# Patient Record
Sex: Female | Born: 1970 | Race: White | Hispanic: No | Marital: Married | Smoking: Never smoker
Health system: Southern US, Community
[De-identification: ages and names within clinical notes are randomized; demographics above are authoritative.]

## PROBLEM LIST (undated history)

## (undated) DIAGNOSIS — R630 Anorexia: Secondary | ICD-10-CM

## (undated) DIAGNOSIS — N200 Calculus of kidney: Secondary | ICD-10-CM

## (undated) DIAGNOSIS — K760 Fatty (change of) liver, not elsewhere classified: Secondary | ICD-10-CM

## (undated) DIAGNOSIS — K589 Irritable bowel syndrome without diarrhea: Secondary | ICD-10-CM

## (undated) DIAGNOSIS — E1169 Type 2 diabetes mellitus with other specified complication: Secondary | ICD-10-CM

## (undated) DIAGNOSIS — R634 Abnormal weight loss: Secondary | ICD-10-CM

## (undated) DIAGNOSIS — R609 Edema, unspecified: Secondary | ICD-10-CM

## (undated) DIAGNOSIS — R42 Dizziness and giddiness: Secondary | ICD-10-CM

## (undated) DIAGNOSIS — N189 Chronic kidney disease, unspecified: Secondary | ICD-10-CM

## (undated) DIAGNOSIS — G4700 Insomnia, unspecified: Secondary | ICD-10-CM

## (undated) DIAGNOSIS — I809 Phlebitis and thrombophlebitis of unspecified site: Secondary | ICD-10-CM

## (undated) DIAGNOSIS — Z973 Presence of spectacles and contact lenses: Secondary | ICD-10-CM

## (undated) DIAGNOSIS — R319 Hematuria, unspecified: Secondary | ICD-10-CM

## (undated) DIAGNOSIS — E785 Hyperlipidemia, unspecified: Secondary | ICD-10-CM

## (undated) DIAGNOSIS — I872 Venous insufficiency (chronic) (peripheral): Secondary | ICD-10-CM

## (undated) DIAGNOSIS — E559 Vitamin D deficiency, unspecified: Secondary | ICD-10-CM

## (undated) DIAGNOSIS — R519 Headache, unspecified: Secondary | ICD-10-CM

## (undated) DIAGNOSIS — R51 Headache: Secondary | ICD-10-CM

## (undated) DIAGNOSIS — N83209 Unspecified ovarian cyst, unspecified side: Secondary | ICD-10-CM

## (undated) DIAGNOSIS — K769 Liver disease, unspecified: Secondary | ICD-10-CM

## (undated) DIAGNOSIS — R5383 Other fatigue: Secondary | ICD-10-CM

## (undated) DIAGNOSIS — F418 Other specified anxiety disorders: Secondary | ICD-10-CM

## (undated) DIAGNOSIS — R600 Localized edema: Secondary | ICD-10-CM

## (undated) DIAGNOSIS — N390 Urinary tract infection, site not specified: Secondary | ICD-10-CM

## (undated) DIAGNOSIS — R6883 Chills (without fever): Secondary | ICD-10-CM

## (undated) DIAGNOSIS — Z9884 Bariatric surgery status: Secondary | ICD-10-CM

## (undated) DIAGNOSIS — H538 Other visual disturbances: Secondary | ICD-10-CM

## (undated) HISTORY — DX: Presence of spectacles and contact lenses: Z97.3

## (undated) HISTORY — DX: Fatty (change of) liver, not elsewhere classified: K76.0

## (undated) HISTORY — PX: CYSTOSTOMY W/ BLADDER BIOPSY: SHX1431

## (undated) HISTORY — PX: APPENDECTOMY: SHX54

## (undated) HISTORY — PX: OTHER SURGICAL HISTORY: SHX169

## (undated) HISTORY — DX: Chronic kidney disease, unspecified: N18.9

## (undated) HISTORY — PX: UPPER GASTROINTESTINAL ENDOSCOPY: SHX188

## (undated) HISTORY — DX: Dizziness and giddiness: R42

## (undated) HISTORY — DX: Unspecified ovarian cyst, unspecified side: N83.209

## (undated) HISTORY — DX: Edema, unspecified: R60.9

## (undated) HISTORY — DX: Irritable bowel syndrome, unspecified: K58.9

## (undated) HISTORY — DX: Abnormal weight loss: R63.4

## (undated) HISTORY — DX: Hyperlipidemia, unspecified: E78.5

## (undated) HISTORY — DX: Liver disease, unspecified: K76.9

## (undated) HISTORY — DX: Headache, unspecified: R51.9

## (undated) HISTORY — DX: Insomnia, unspecified: G47.00

## (undated) HISTORY — DX: Other fatigue: R53.83

## (undated) HISTORY — DX: Urinary tract infection, site not specified: N39.0

## (undated) HISTORY — DX: Vitamin D deficiency, unspecified: E55.9

## (undated) HISTORY — PX: TUBAL LIGATION: SHX77

## (undated) HISTORY — DX: Bariatric surgery status: Z98.84

## (undated) HISTORY — DX: Headache: R51

## (undated) HISTORY — PX: COLONOSCOPY: SHX174

## (undated) HISTORY — DX: Other specified anxiety disorders: F41.8

## (undated) HISTORY — DX: Hematuria, unspecified: R31.9

## (undated) HISTORY — DX: Chills (without fever): R68.83

## (undated) HISTORY — DX: Venous insufficiency (chronic) (peripheral): I87.2

## (undated) HISTORY — DX: Localized edema: R60.0

## (undated) HISTORY — DX: Anorexia: R63.0

## (undated) HISTORY — DX: Type 2 diabetes mellitus with other specified complication: E11.69

## (undated) HISTORY — DX: Calculus of kidney: N20.0

## (undated) HISTORY — DX: Other visual disturbances: H53.8

---

## 2003-12-29 HISTORY — PX: CHOLECYSTECTOMY: SHX55

## 2006-12-28 HISTORY — PX: ABDOMINAL HYSTERECTOMY: SHX81

## 2009-07-23 ENCOUNTER — Emergency Department (HOSPITAL_BASED_OUTPATIENT_CLINIC_OR_DEPARTMENT_OTHER): Admission: EM | Admit: 2009-07-23 | Discharge: 2009-07-23 | Payer: Self-pay | Admitting: Emergency Medicine

## 2009-10-02 ENCOUNTER — Emergency Department (HOSPITAL_BASED_OUTPATIENT_CLINIC_OR_DEPARTMENT_OTHER): Admission: EM | Admit: 2009-10-02 | Discharge: 2009-10-02 | Payer: Self-pay | Admitting: Emergency Medicine

## 2009-10-02 ENCOUNTER — Emergency Department (HOSPITAL_BASED_OUTPATIENT_CLINIC_OR_DEPARTMENT_OTHER): Admission: EM | Admit: 2009-10-02 | Discharge: 2009-10-03 | Payer: Self-pay | Admitting: Emergency Medicine

## 2009-11-19 ENCOUNTER — Ambulatory Visit: Payer: Self-pay | Admitting: Diagnostic Radiology

## 2009-11-19 ENCOUNTER — Emergency Department (HOSPITAL_BASED_OUTPATIENT_CLINIC_OR_DEPARTMENT_OTHER): Admission: EM | Admit: 2009-11-19 | Discharge: 2009-11-19 | Payer: Self-pay | Admitting: Emergency Medicine

## 2009-12-18 ENCOUNTER — Emergency Department (HOSPITAL_BASED_OUTPATIENT_CLINIC_OR_DEPARTMENT_OTHER): Admission: EM | Admit: 2009-12-18 | Discharge: 2009-12-18 | Payer: Self-pay | Admitting: Emergency Medicine

## 2009-12-18 ENCOUNTER — Ambulatory Visit: Payer: Self-pay | Admitting: Diagnostic Radiology

## 2010-01-24 ENCOUNTER — Emergency Department (HOSPITAL_BASED_OUTPATIENT_CLINIC_OR_DEPARTMENT_OTHER): Admission: EM | Admit: 2010-01-24 | Discharge: 2010-01-24 | Payer: Self-pay | Admitting: Emergency Medicine

## 2010-01-24 ENCOUNTER — Ambulatory Visit: Payer: Self-pay | Admitting: Radiology

## 2010-02-02 ENCOUNTER — Emergency Department (HOSPITAL_BASED_OUTPATIENT_CLINIC_OR_DEPARTMENT_OTHER): Admission: EM | Admit: 2010-02-02 | Discharge: 2010-02-02 | Payer: Self-pay | Admitting: Emergency Medicine

## 2010-03-03 ENCOUNTER — Ambulatory Visit: Payer: Self-pay | Admitting: Diagnostic Radiology

## 2010-03-03 ENCOUNTER — Emergency Department (HOSPITAL_BASED_OUTPATIENT_CLINIC_OR_DEPARTMENT_OTHER): Admission: EM | Admit: 2010-03-03 | Discharge: 2010-03-03 | Payer: Self-pay | Admitting: Emergency Medicine

## 2010-03-31 ENCOUNTER — Ambulatory Visit: Payer: Self-pay | Admitting: Diagnostic Radiology

## 2010-03-31 ENCOUNTER — Emergency Department (HOSPITAL_BASED_OUTPATIENT_CLINIC_OR_DEPARTMENT_OTHER): Admission: EM | Admit: 2010-03-31 | Discharge: 2010-03-31 | Payer: Self-pay | Admitting: Emergency Medicine

## 2010-04-21 ENCOUNTER — Ambulatory Visit: Payer: Self-pay | Admitting: Radiology

## 2010-04-21 ENCOUNTER — Emergency Department (HOSPITAL_BASED_OUTPATIENT_CLINIC_OR_DEPARTMENT_OTHER): Admission: EM | Admit: 2010-04-21 | Discharge: 2010-04-21 | Payer: Self-pay | Admitting: Emergency Medicine

## 2010-05-25 ENCOUNTER — Emergency Department (HOSPITAL_BASED_OUTPATIENT_CLINIC_OR_DEPARTMENT_OTHER): Admission: EM | Admit: 2010-05-25 | Discharge: 2010-05-25 | Payer: Self-pay | Admitting: Emergency Medicine

## 2010-06-03 ENCOUNTER — Ambulatory Visit: Payer: Self-pay | Admitting: Diagnostic Radiology

## 2010-06-03 ENCOUNTER — Emergency Department (HOSPITAL_BASED_OUTPATIENT_CLINIC_OR_DEPARTMENT_OTHER): Admission: EM | Admit: 2010-06-03 | Discharge: 2010-06-03 | Payer: Self-pay | Admitting: Emergency Medicine

## 2010-06-20 ENCOUNTER — Ambulatory Visit: Payer: Self-pay | Admitting: Gastroenterology

## 2010-07-22 ENCOUNTER — Ambulatory Visit (HOSPITAL_COMMUNITY): Admission: RE | Admit: 2010-07-22 | Discharge: 2010-07-22 | Payer: Self-pay | Admitting: Surgery

## 2010-07-23 ENCOUNTER — Ambulatory Visit (HOSPITAL_COMMUNITY): Admission: RE | Admit: 2010-07-23 | Discharge: 2010-07-23 | Payer: Self-pay | Admitting: Surgery

## 2010-08-04 ENCOUNTER — Emergency Department (HOSPITAL_BASED_OUTPATIENT_CLINIC_OR_DEPARTMENT_OTHER): Admission: EM | Admit: 2010-08-04 | Discharge: 2010-08-04 | Payer: Self-pay | Admitting: Emergency Medicine

## 2010-08-04 ENCOUNTER — Ambulatory Visit: Payer: Self-pay | Admitting: Diagnostic Radiology

## 2010-08-13 ENCOUNTER — Encounter: Admission: RE | Admit: 2010-08-13 | Discharge: 2010-11-11 | Payer: Self-pay | Admitting: Surgery

## 2010-09-18 ENCOUNTER — Ambulatory Visit: Payer: Self-pay | Admitting: Diagnostic Radiology

## 2010-09-18 ENCOUNTER — Emergency Department (HOSPITAL_BASED_OUTPATIENT_CLINIC_OR_DEPARTMENT_OTHER): Admission: EM | Admit: 2010-09-18 | Discharge: 2010-09-18 | Payer: Self-pay | Admitting: Emergency Medicine

## 2010-10-28 HISTORY — PX: GASTRIC BYPASS: SHX52

## 2010-11-04 ENCOUNTER — Emergency Department (HOSPITAL_COMMUNITY)
Admission: EM | Admit: 2010-11-04 | Discharge: 2010-11-04 | Payer: Self-pay | Source: Home / Self Care | Admitting: Emergency Medicine

## 2010-11-04 ENCOUNTER — Ambulatory Visit: Payer: Self-pay | Admitting: Diagnostic Radiology

## 2010-11-04 ENCOUNTER — Encounter: Payer: Self-pay | Admitting: Emergency Medicine

## 2010-11-09 ENCOUNTER — Emergency Department (HOSPITAL_BASED_OUTPATIENT_CLINIC_OR_DEPARTMENT_OTHER): Admission: EM | Admit: 2010-11-09 | Discharge: 2010-11-09 | Payer: Self-pay | Admitting: Emergency Medicine

## 2010-11-24 ENCOUNTER — Inpatient Hospital Stay (HOSPITAL_COMMUNITY)
Admission: RE | Admit: 2010-11-24 | Discharge: 2010-11-26 | Payer: Self-pay | Source: Home / Self Care | Admitting: Surgery

## 2010-11-25 ENCOUNTER — Encounter (INDEPENDENT_AMBULATORY_CARE_PROVIDER_SITE_OTHER): Payer: Self-pay | Admitting: Surgery

## 2010-12-09 ENCOUNTER — Encounter
Admission: RE | Admit: 2010-12-09 | Discharge: 2011-01-27 | Payer: Self-pay | Source: Home / Self Care | Attending: Surgery | Admitting: Surgery

## 2010-12-28 HISTORY — PX: BILATERAL SALPINGOOPHORECTOMY: SHX1223

## 2011-01-01 ENCOUNTER — Ambulatory Visit: Admit: 2011-01-01 | Payer: Self-pay | Admitting: Gastroenterology

## 2011-01-18 ENCOUNTER — Encounter: Payer: Self-pay | Admitting: Gastroenterology

## 2011-01-20 ENCOUNTER — Encounter: Admit: 2011-01-20 | Payer: Self-pay | Admitting: Surgery

## 2011-02-02 IMAGING — CT CT ABD-PELV W/ CM
2 of 4 series · 17 of 46 positions shown, 19 images · IV contrast (APPLIED)
Comparison: Pelvic ultrasound, same day.  CT the abdomen and pelvis
dated 04/21/2010

CLINICAL DATA: Right lower quadrant pain and nausea

CT ABDOMEN AND PELVIS WITH CONTRAST
TECHNIQUE: Multidetector CT imaging of the abdomen and pelvis was
performed following the standard protocol during bolus
administration of intravenous contrast.
Contrast: 100 ml of 3mnipaque-7NN

[Series 2: abd/pelvis 5.0 b31f · axial · 0.98mm/px · z∈[-541,-96]mm · 14 of 97 slices shown, 16 images]
[im 4/97  soft-tissue]
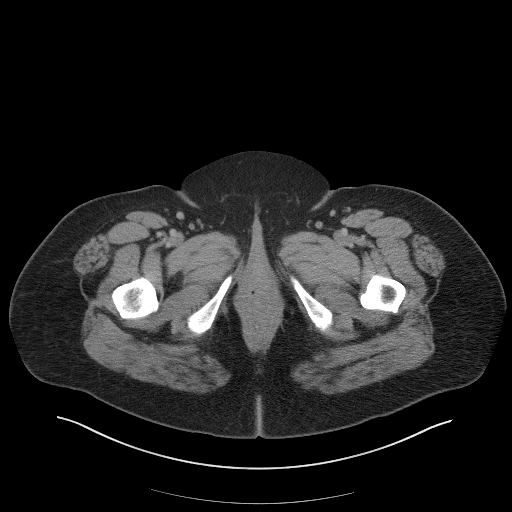
[im 4/97  bone]
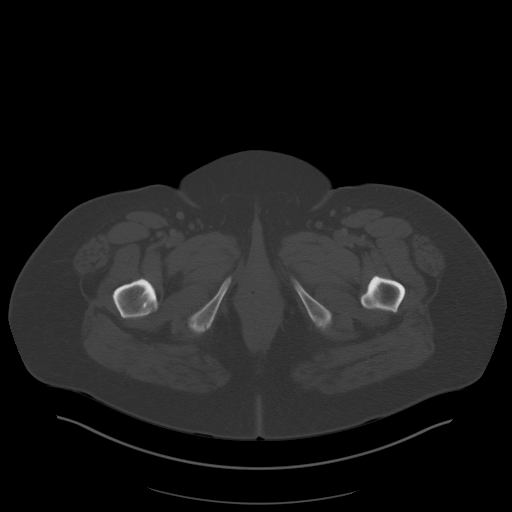
[im 12/97  soft-tissue]
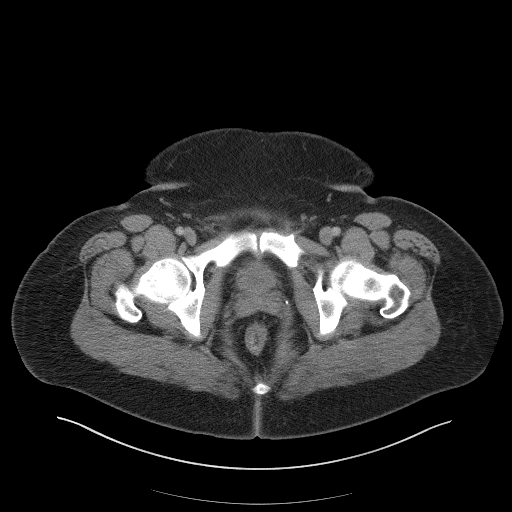
[im 20/97  soft-tissue]
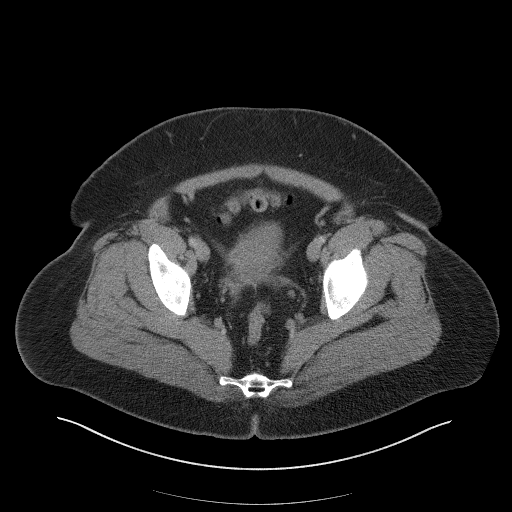
[im 27/97  soft-tissue]
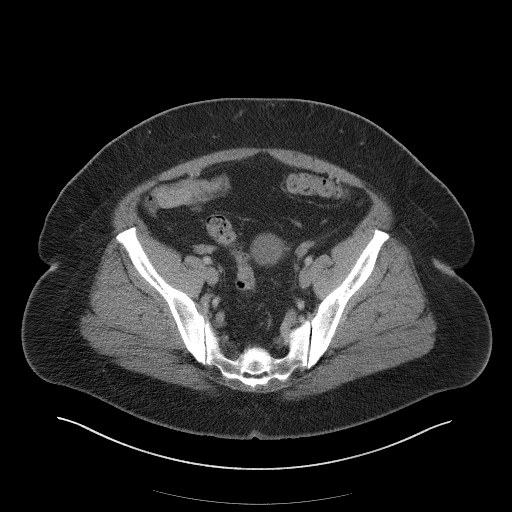
[im 31/97  soft-tissue]
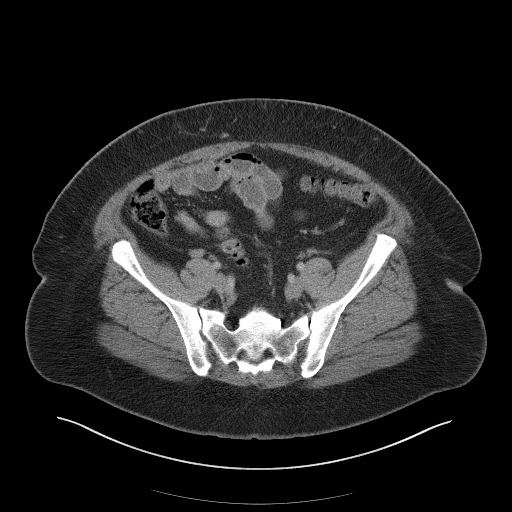
[im 39/97  soft-tissue]
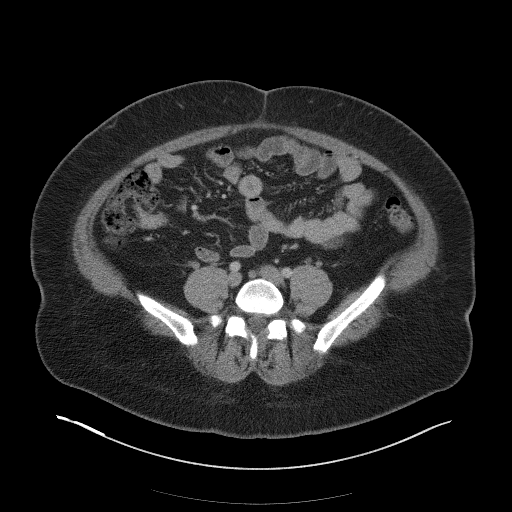
[im 47/97  soft-tissue]
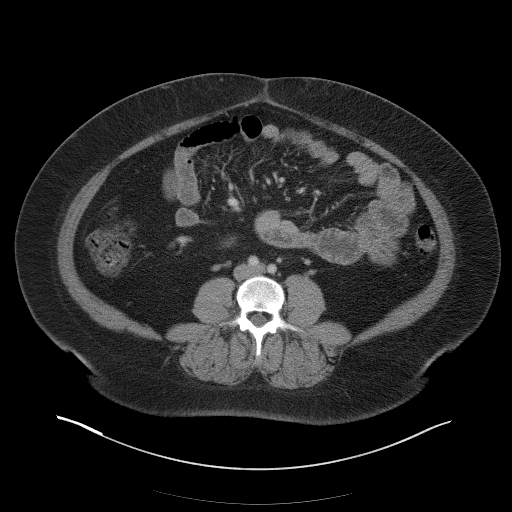
[im 50/97  soft-tissue]
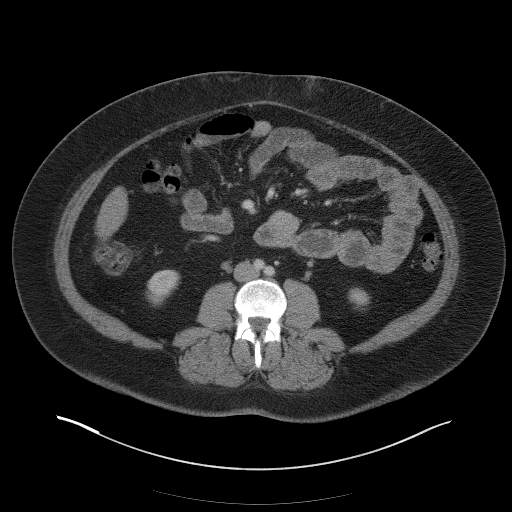
[im 58/97  soft-tissue]
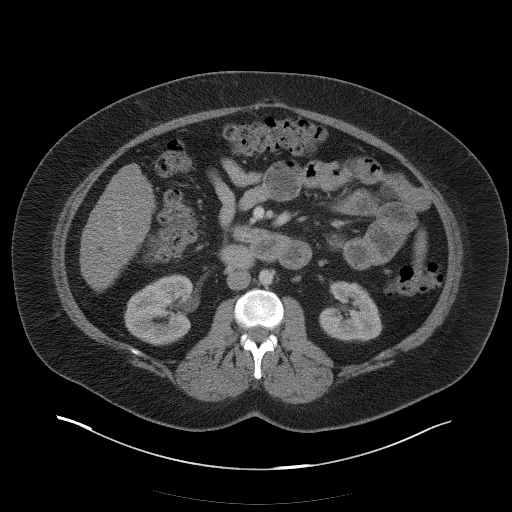
[im 58/97  bone]
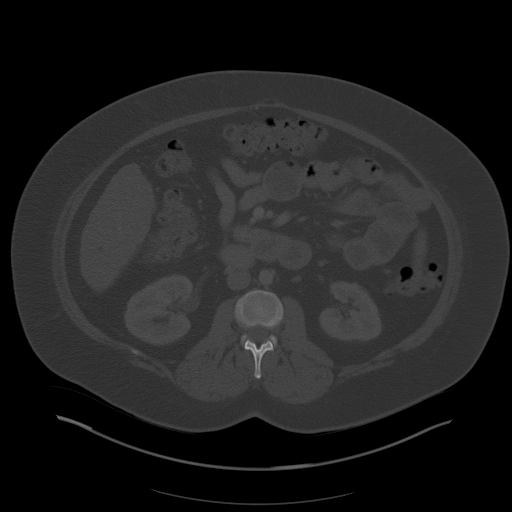
[im 66/97  soft-tissue]
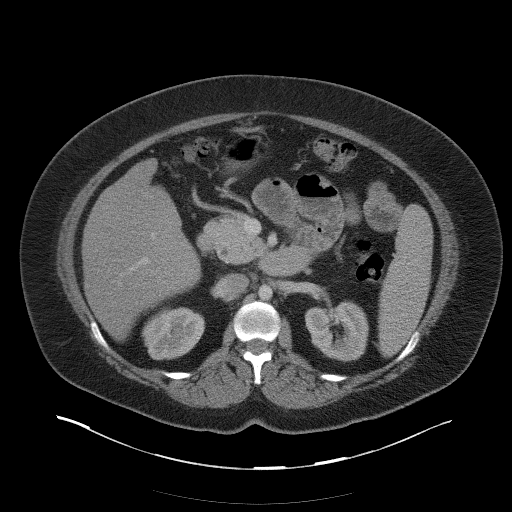
[im 73/97  soft-tissue]
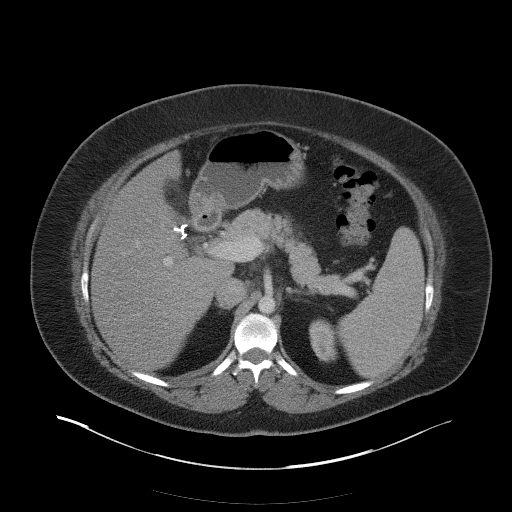
[im 77/97  soft-tissue]
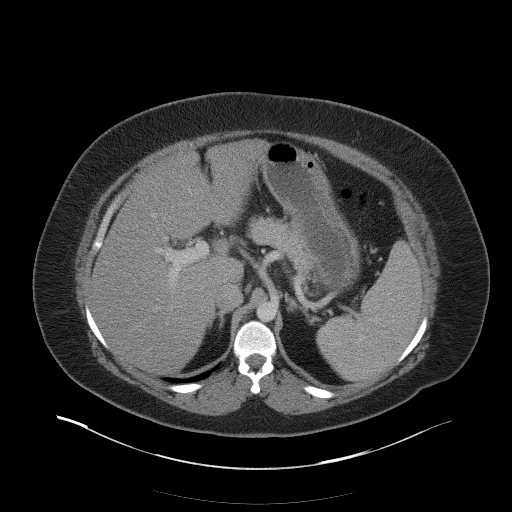
[im 85/97  soft-tissue]
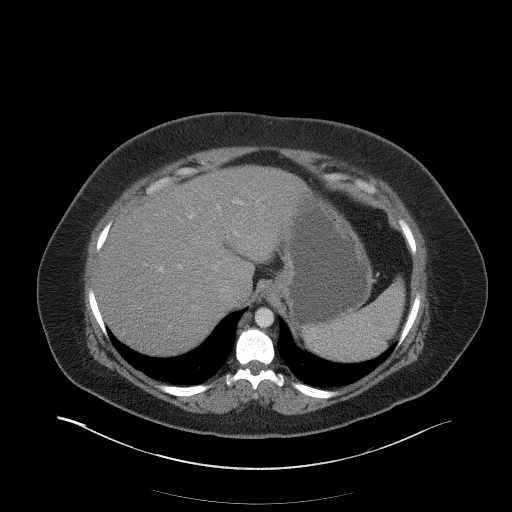
[im 93/97  soft-tissue]
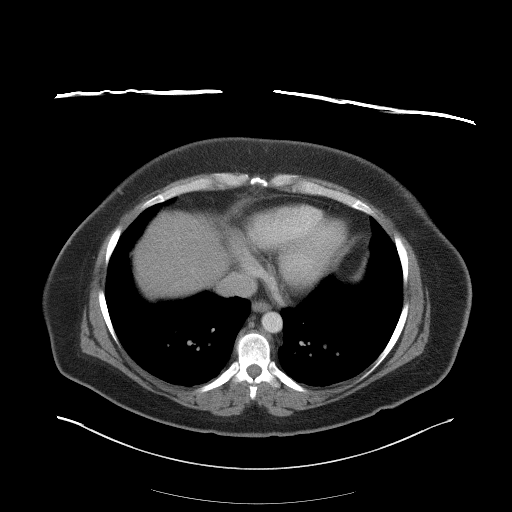

[Series 5: abd/pelvis 3.0 coronal · coronal · 1.05mm/px · 3 of 115 slices shown]
[im 39/115  soft-tissue]
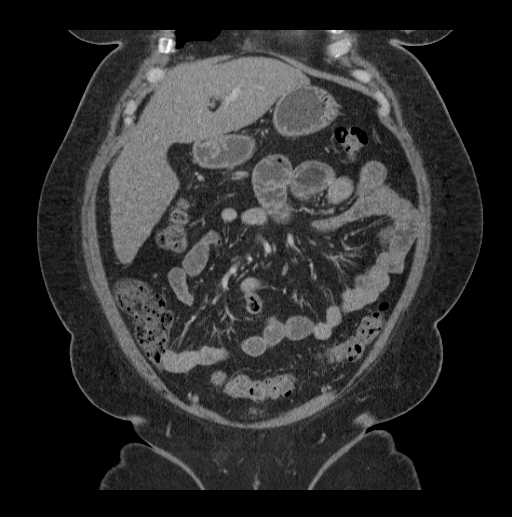
[im 51/115  soft-tissue]
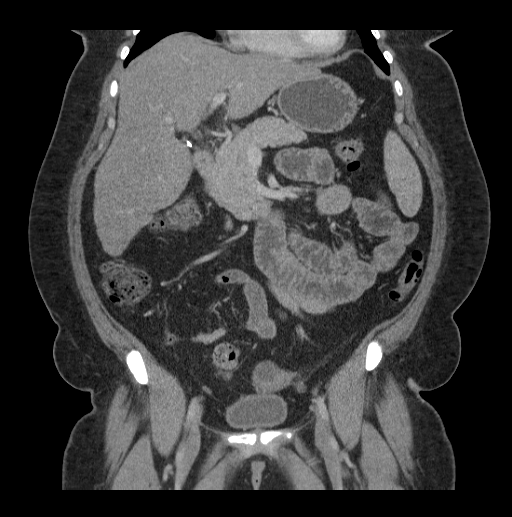
[im 64/115  soft-tissue]
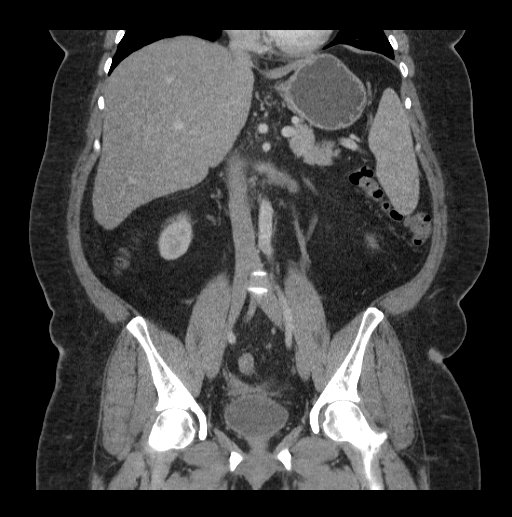

[17 of 46 positions shown; findings below may reference images not displayed]

FINDINGS: The lung bases are clear.  The heart is normal in size.

Two sub centimeter hypoattenuating lesions are seen in the
posterior right hepatic lobe, both of which are too small to
characterize.  The liver is otherwise normal. The patient status
post cholecystectomy.  The spleen, adrenal glands pancreas and
kidneys are within normal limits.  There is no hydronephrosis seen.

There are several loops of minimally dilated small bowel in the
left upper quadrant measuring up to 3.5 cm.  There is gas and fluid
in the more distal loops of small bowel.  A normal appendix is seen
in the right lower quadrant.  Gas and stool seen throughout the
colon.

The patient status post hysterectomy.  A low attenuation in lesion
is seen in the left ovary measuring a maximum 3.5 cm.  The right
ovary is normal.  No free fluid is seen in the abdomen or pelvis.
The aorta and major branch vessels are patent.

The osseous structures are within normal limits.
IMPRESSION: 1.  Loops of mildly dilated small bowel in the left upper quadrant
with frayed gas is seen in more distal bowel.  There is stool
throughout the colon.  This may relate to partial, low grade small
bowel obstruction or early small bowel obstruction.
2.  Left adnexal mass which could possibly be related to
hemorrhagic cyst versus endometrioma.  Again, please see dedicated
pelvic ultrasound for further evaluation and recommendations.

## 2011-02-02 IMAGING — US US PELVIS COMPLETE MODIFY
1 series · 13 of 25 positions shown · non-contrast
Comparison: CT 04/21/2010.  CT 03/31/2010.

CLINICAL DATA: History of right-sided pelvic pain.  History of
hysterectomy.

TRANSABDOMINAL AND TRANSVAGINAL ULTRASOUND OF PELVIS
DOPPLER ULTRASOUND OF OVARIES
TECHNIQUE: Both transabdominal and transvaginal ultrasound
examinations of the pelvis were performed including evaluation of
the uterus, ovaries, adnexal regions, and pelvic cul-de-sac.
Transabdominal technique was performed for global imaging of the
pelvis.  Transvaginal technique was performed for detailed
evaluation of the endometrium and/or ovaries.  Color and duplex
Doppler ultrasound was utilized to evaluate blood flow to the
ovaries.

[Series 1: us pelvis complete modify · 0.30mm/px · 13 of 68 slices shown]
[im 1/68]
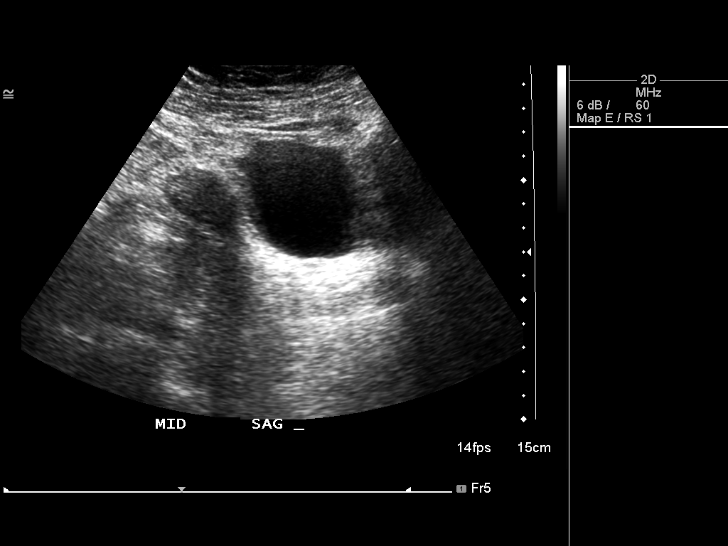
[im 6/68]
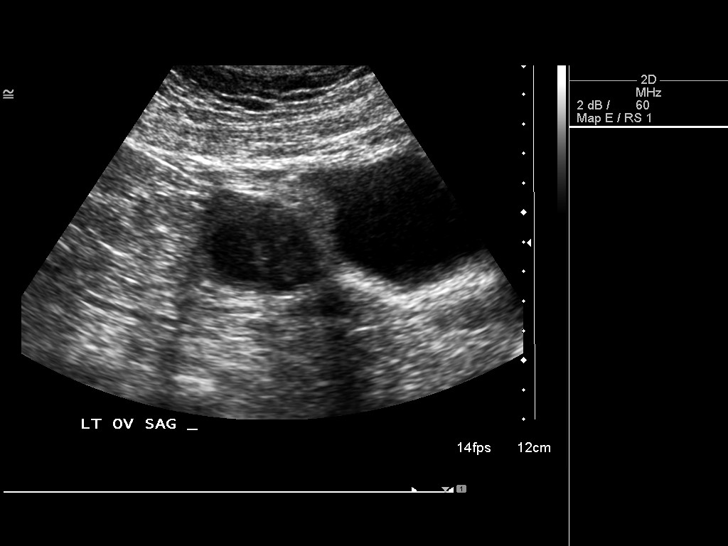
[im 12/68]
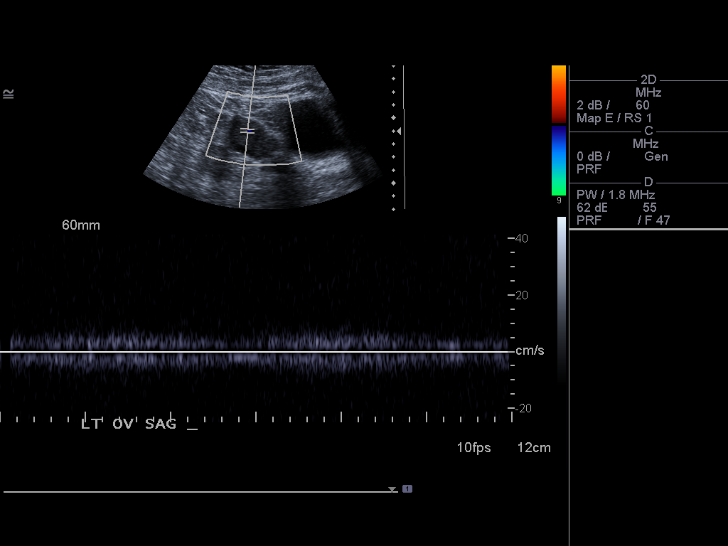
[im 17/68]
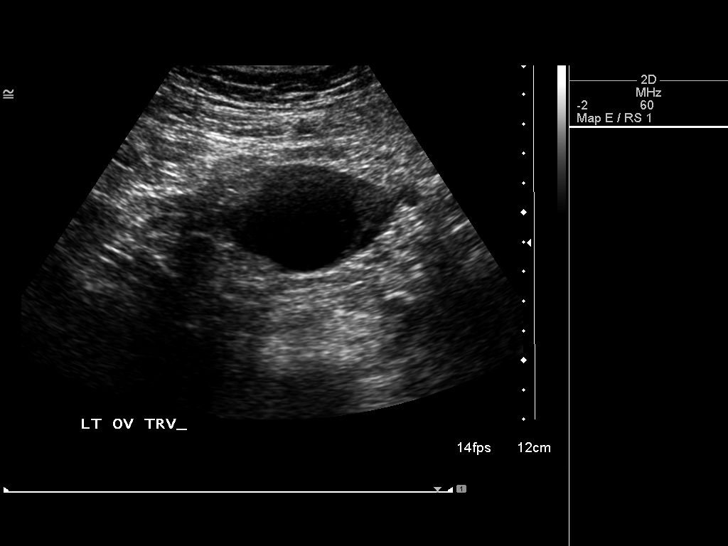
[im 23/68]
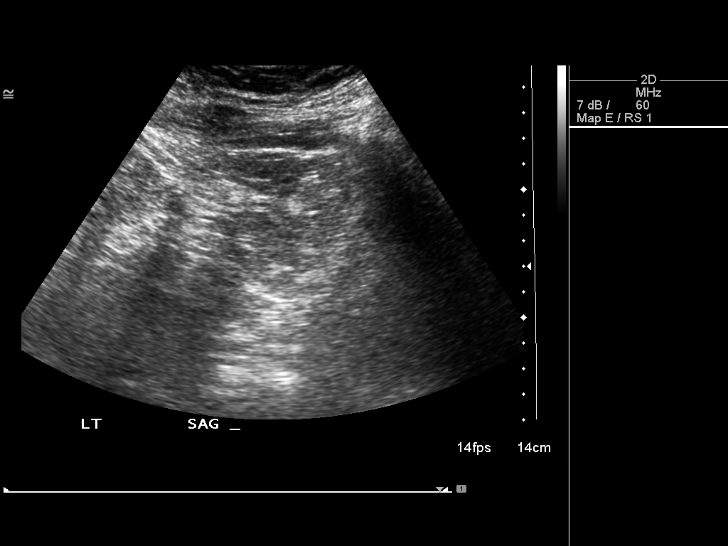
[im 28/68]
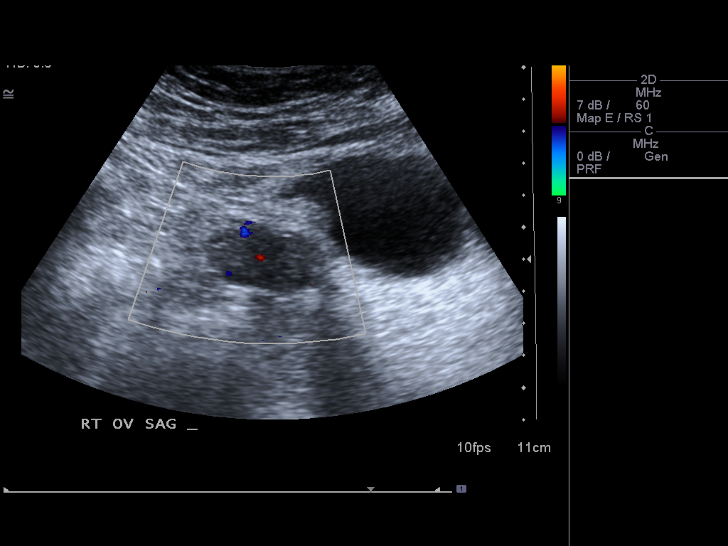
[im 34/68]
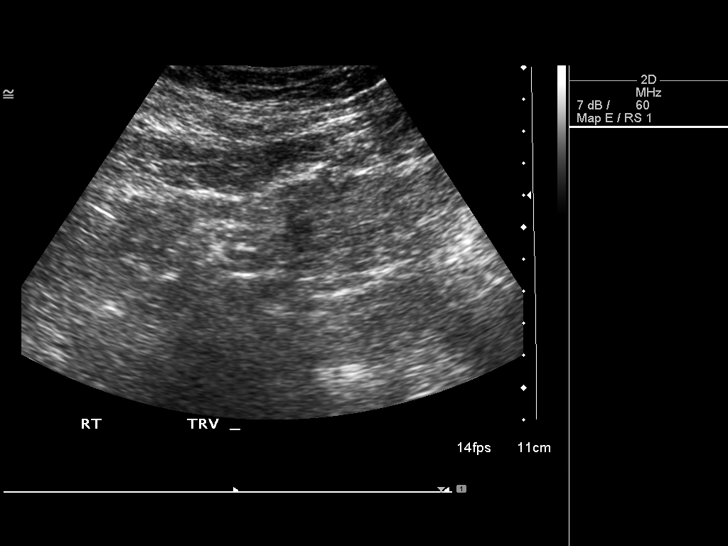
[im 40/68]
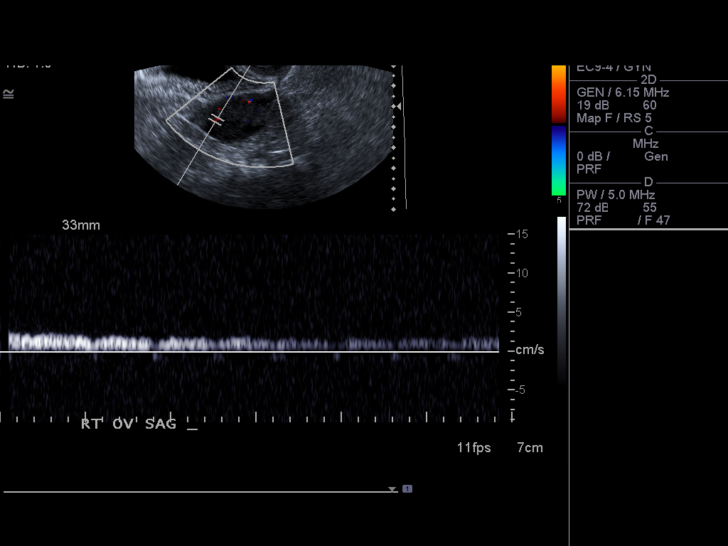
[im 45/68]
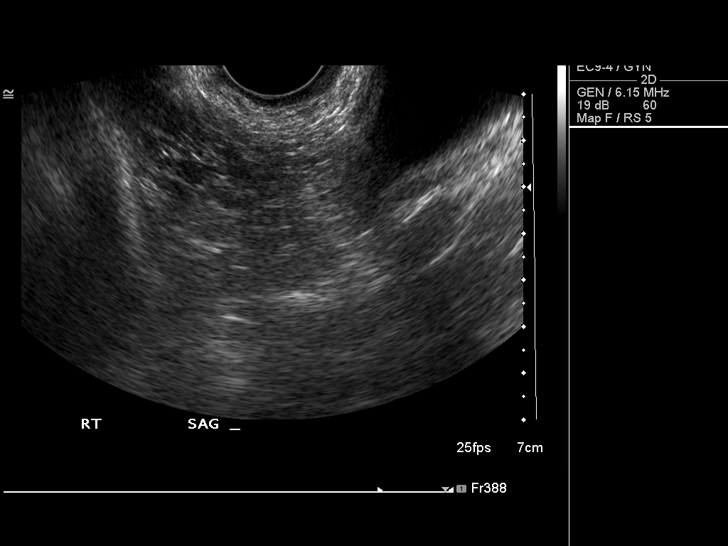
[im 51/68]
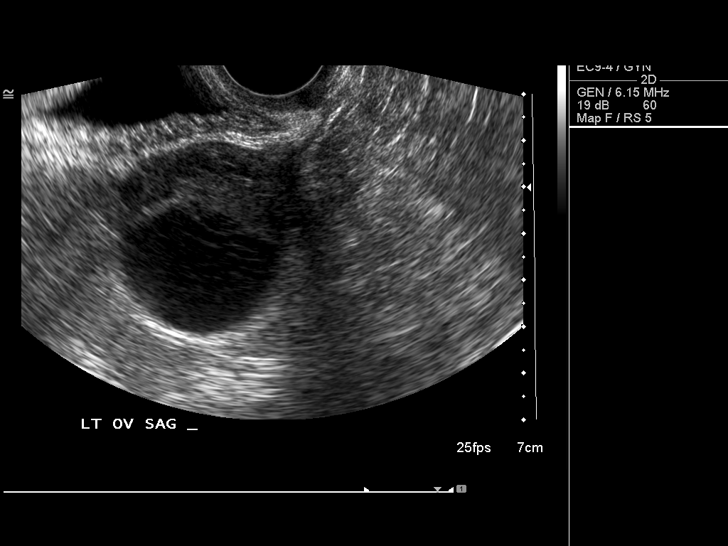
[im 56/68]
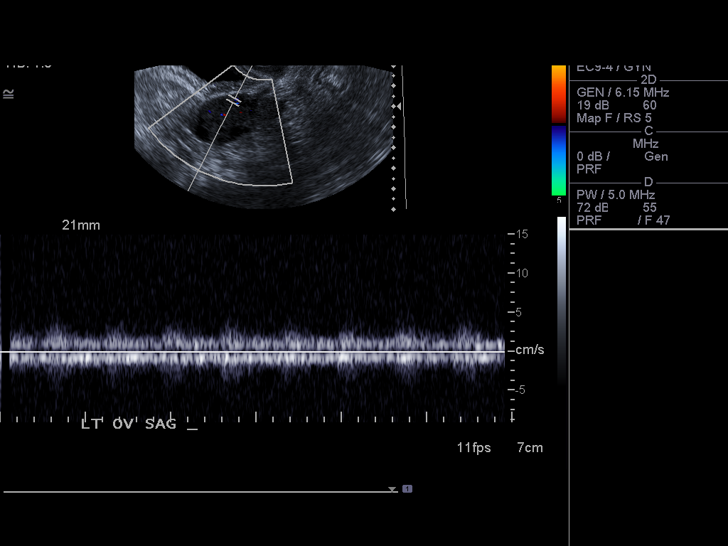
[im 62/68]
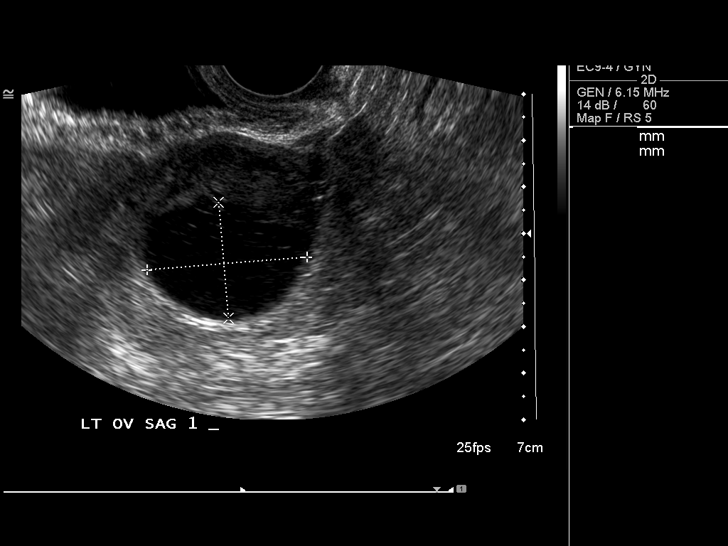
[im 68/68]
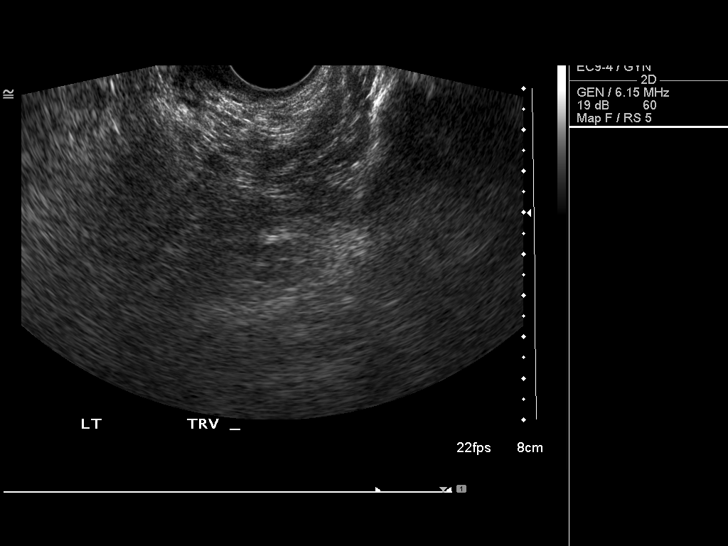

[13 of 25 positions shown; findings below may reference images not displayed]

FINDINGS: Uterus :  There is history of previous hysterectomy.  No uterus was
evident.

Endometrium :  Previous hysterectomy has been performed.

Right Ovary measures 3.8 x 2.0 x 2.5 cm.  Neither right ovarian
lesion is evident.  Color Doppler flow appeared normal.  Arterial
venous waveforms appeared normal.

Left Ovary measures 6.9 x 4.8 x 3.3 cm.  This is enlarged.

Hypoechoic lesion is evident within the left ovary.  The lesion
measures 5.2 x 4.2 x 3.9 cm. It has some low intensity internal
echoes within it.  No internal color Doppler flow was seen.  No
solid or papillary area was seen associated with it.  It has a thin
wall.  No calcifications were evident within it.

 Left ovarian tissue color Doppler flow appeared normal.  Left
ovarian tissue venous and arterial waveforms appeared normal.

Other Findings:  No free fluid is seen within the pelvis.  No tubal
abnormality is evident.
IMPRESSION: Previous hysterectomy has been performed.  No free fluid is seen in
the pelvis.

History given of right-sided pain.  No abnormality was seen on the
right side.

Both ovaries showed normal color Doppler flow with normal venous
and arterial wave forms.  There is no evidence of torsion or
ischemia.

There is enlargement of the left ovary.

Hypoechoic lesion is evident within the left ovary.  The lesion
measures 5.2 x 4.2 x 3.9 cm. It appears cystic. It has some low
intensity internal echoes within it.  No internal color Doppler
flow was seen.  No solid or papillary area was seen associated with
it.  It has a thin wall.  No calcifications were evident within it.

The left ovarian lesion may reflect a complicated cyst.  However,
recommend follow-up in 6-12 weeks for additional evaluation and
characterization.

This recommendation follows the consensus statement:  Management of
Asymptomatic Ovarian and Other Adnexal Cysts Imaged at US:  Society
of Radiologists in Ultrasound Consensus Conference Statement.
[URL]

## 2011-03-10 LAB — CBC
HCT: 35.6 % — ABNORMAL LOW (ref 36.0–46.0)
Hemoglobin: 13.9 g/dL (ref 12.0–15.0)
MCH: 29.9 pg (ref 26.0–34.0)
MCH: 30.8 pg (ref 26.0–34.0)
MCHC: 34.5 g/dL (ref 30.0–36.0)
MCHC: 34.8 g/dL (ref 30.0–36.0)
MCV: 88.5 fL (ref 78.0–100.0)
MCV: 87.9 fL (ref 78.0–100.0)
MCV: 88.5 fL (ref 78.0–100.0)
Platelets: 118 10*3/uL — ABNORMAL LOW (ref 150–400)
Platelets: 98 10*3/uL — ABNORMAL LOW (ref 150–400)
RBC: 4.63 MIL/uL (ref 3.87–5.11)
RBC: 4.05 MIL/uL (ref 3.87–5.11)
RDW: 13.7 % (ref 11.5–15.5)
RDW: 14.2 % (ref 11.5–15.5)
WBC: 6.9 10*3/uL (ref 4.0–10.5)
WBC: 7 10*3/uL (ref 4.0–10.5)

## 2011-03-10 LAB — DIFFERENTIAL
Basophils Absolute: 0 10*3/uL (ref 0.0–0.1)
Basophils Relative: 1 % (ref 0–1)
Basophils Relative: 1 % (ref 0–1)
Eosinophils Absolute: 0 10*3/uL (ref 0.0–0.7)
Eosinophils Absolute: 0.1 10*3/uL (ref 0.0–0.7)
Eosinophils Relative: 3 % (ref 0–5)
Eosinophils Relative: 0 % (ref 0–5)
Eosinophils Relative: 1 % (ref 0–5)
Lymphocytes Relative: 34 % (ref 12–46)
Lymphocytes Relative: 30 % (ref 12–46)
Lymphs Abs: 1.5 10*3/uL (ref 0.7–4.0)
Lymphs Abs: 2.1 10*3/uL (ref 0.7–4.0)
Lymphs Abs: 2.7 10*3/uL (ref 0.7–4.0)
Monocytes Absolute: 0.4 10*3/uL (ref 0.1–1.0)
Monocytes Absolute: 0.5 10*3/uL (ref 0.1–1.0)
Monocytes Absolute: 0.5 10*3/uL (ref 0.1–1.0)
Monocytes Relative: 5 % (ref 3–12)
Monocytes Relative: 6 % (ref 3–12)
Neutro Abs: 2.8 10*3/uL (ref 1.7–7.7)
Neutro Abs: 3.7 10*3/uL (ref 1.7–7.7)
Neutrophils Relative %: 53 % (ref 43–77)
Neutrophils Relative %: 79 % — ABNORMAL HIGH (ref 43–77)

## 2011-03-10 LAB — GLUCOSE, CAPILLARY
Glucose-Capillary: 145 mg/dL — ABNORMAL HIGH (ref 70–99)
Glucose-Capillary: 146 mg/dL — ABNORMAL HIGH (ref 70–99)
Glucose-Capillary: 170 mg/dL — ABNORMAL HIGH (ref 70–99)
Glucose-Capillary: 189 mg/dL — ABNORMAL HIGH (ref 70–99)
Glucose-Capillary: 196 mg/dL — ABNORMAL HIGH (ref 70–99)
Glucose-Capillary: 198 mg/dL — ABNORMAL HIGH (ref 70–99)
Glucose-Capillary: 209 mg/dL — ABNORMAL HIGH (ref 70–99)
Glucose-Capillary: 235 mg/dL — ABNORMAL HIGH (ref 70–99)
Glucose-Capillary: 278 mg/dL — ABNORMAL HIGH (ref 70–99)
Glucose-Capillary: 278 mg/dL — ABNORMAL HIGH (ref 70–99)
Glucose-Capillary: 325 mg/dL — ABNORMAL HIGH (ref 70–99)

## 2011-03-10 LAB — URINALYSIS, ROUTINE W REFLEX MICROSCOPIC
Glucose, UA: >1000 mg/dL — AB
Glucose, UA: NEGATIVE mg/dL
Leukocytes, UA: NEGATIVE
Nitrite: NEGATIVE
Protein, ur: NEGATIVE mg/dL
Specific Gravity, Urine: 1.03 (ref 1.005–1.030)
Specific Gravity, Urine: 1.018 (ref 1.005–1.030)
pH: 6 (ref 5.0–8.0)
pH: 8 (ref 5.0–8.0)

## 2011-03-10 LAB — HEMOGLOBIN AND HEMATOCRIT, BLOOD
HCT: 37.1 % (ref 36.0–46.0)
HCT: 37.1 % (ref 36.0–46.0)
Hemoglobin: 12.9 g/dL (ref 12.0–15.0)

## 2011-03-10 LAB — URINE MICROSCOPIC-ADD ON

## 2011-03-10 LAB — BASIC METABOLIC PANEL
CO2: 25 mEq/L (ref 19–32)
Chloride: 100 mEq/L (ref 96–112)
GFR calc Af Amer: >60 mL/min (ref >60)
Sodium: 139 mEq/L (ref 135–145)

## 2011-03-10 LAB — COMPREHENSIVE METABOLIC PANEL
ALT: 29 U/L (ref 0–35)
Albumin: 4.1 g/dL (ref 3.5–5.2)
Alkaline Phosphatase: 52 U/L (ref 39–117)
Calcium: 9.7 mg/dL (ref 8.4–10.5)
Potassium: 3.5 mEq/L (ref 3.5–5.1)
Sodium: 141 mEq/L (ref 135–145)
Total Protein: 7.2 g/dL (ref 6.0–8.3)

## 2011-03-10 LAB — URINE CULTURE
Colony Count: NO GROWTH
Colony Count: NO GROWTH
Culture  Setup Time: 201111290131
Special Requests: NEGATIVE

## 2011-03-10 LAB — SURGICAL PCR SCREEN
MRSA, PCR: NEGATIVE
Staphylococcus aureus: NEGATIVE

## 2011-03-12 LAB — URINALYSIS, ROUTINE W REFLEX MICROSCOPIC
Glucose, UA: >1000 mg/dL — AB
Specific Gravity, Urine: 1.023 (ref 1.005–1.030)
Urobilinogen, UA: 1 mg/dL (ref 0.0–1.0)

## 2011-03-12 LAB — URINE CULTURE

## 2011-03-12 LAB — URINE MICROSCOPIC-ADD ON

## 2011-03-13 LAB — COMPREHENSIVE METABOLIC PANEL
ALT: 28 U/L (ref 0–35)
AST: 34 U/L (ref 0–37)
Alkaline Phosphatase: 57 U/L (ref 39–117)
CO2: 26 mEq/L (ref 19–32)
Chloride: 101 mEq/L (ref 96–112)
Creatinine, Ser: 0.9 mg/dL (ref 0.4–1.2)
GFR calc Af Amer: >60 mL/min (ref >60)
GFR calc non Af Amer: >60 mL/min (ref >60)
Potassium: 4.1 mEq/L (ref 3.5–5.1)
Sodium: 137 mEq/L (ref 135–145)
Total Bilirubin: 1.4 mg/dL — ABNORMAL HIGH (ref 0.3–1.2)

## 2011-03-13 LAB — URINALYSIS, ROUTINE W REFLEX MICROSCOPIC
Bilirubin Urine: NEGATIVE
Ketones, ur: NEGATIVE mg/dL
Leukocytes, UA: NEGATIVE
Nitrite: NEGATIVE
Protein, ur: 100 mg/dL — AB

## 2011-03-13 LAB — CBC
Hemoglobin: 13.8 g/dL (ref 12.0–15.0)
MCH: 30.5 pg (ref 26.0–34.0)
RBC: 4.52 MIL/uL (ref 3.87–5.11)
WBC: 6.9 10*3/uL (ref 4.0–10.5)

## 2011-03-13 LAB — DIFFERENTIAL
Basophils Absolute: 0.1 10*3/uL (ref 0.0–0.1)
Basophils Relative: 1 % (ref 0–1)
Eosinophils Absolute: 0.2 10*3/uL (ref 0.0–0.7)
Eosinophils Relative: 2 % (ref 0–5)
Monocytes Absolute: 0.3 10*3/uL (ref 0.1–1.0)

## 2011-03-13 LAB — WET PREP, GENITAL
Clue Cells Wet Prep HPF POC: NONE SEEN
Trich, Wet Prep: NONE SEEN

## 2011-03-15 LAB — BASIC METABOLIC PANEL
BUN: 13 mg/dL (ref 6–23)
Calcium: 9.2 mg/dL (ref 8.4–10.5)
GFR calc non Af Amer: >60 mL/min (ref >60)
Potassium: 3.7 mEq/L (ref 3.5–5.1)
Sodium: 139 mEq/L (ref 135–145)

## 2011-03-15 LAB — DIFFERENTIAL
Basophils Absolute: 0.1 10*3/uL (ref 0.0–0.1)
Eosinophils Relative: 2 % (ref 0–5)
Lymphocytes Relative: 39 % (ref 12–46)
Lymphs Abs: 2.8 10*3/uL (ref 0.7–4.0)
Neutro Abs: 3.7 10*3/uL (ref 1.7–7.7)

## 2011-03-15 LAB — URINALYSIS, ROUTINE W REFLEX MICROSCOPIC
Glucose, UA: NEGATIVE mg/dL
Ketones, ur: NEGATIVE mg/dL
Leukocytes, UA: NEGATIVE
Nitrite: NEGATIVE
Specific Gravity, Urine: 1.021 (ref 1.005–1.030)
pH: 7.5 (ref 5.0–8.0)

## 2011-03-15 LAB — CBC
HCT: 39.6 % (ref 36.0–46.0)
Hemoglobin: 13.7 g/dL (ref 12.0–15.0)
Platelets: 92 10*3/uL — ABNORMAL LOW (ref 150–400)
RDW: 13.2 % (ref 11.5–15.5)
WBC: 7.1 10*3/uL (ref 4.0–10.5)

## 2011-03-15 LAB — URINE MICROSCOPIC-ADD ON

## 2011-03-16 LAB — DIFFERENTIAL
Eosinophils Relative: 2 % (ref 0–5)
Lymphocytes Relative: 36 % (ref 12–46)
Lymphocytes Relative: 43 % (ref 12–46)
Lymphs Abs: 2.5 10*3/uL (ref 0.7–4.0)
Monocytes Absolute: 0.3 10*3/uL (ref 0.1–1.0)
Monocytes Relative: 5 % (ref 3–12)
Monocytes Relative: 6 % (ref 3–12)
Neutro Abs: 2.4 10*3/uL (ref 1.7–7.7)

## 2011-03-16 LAB — COMPREHENSIVE METABOLIC PANEL
ALT: 23 U/L (ref 0–35)
AST: 33 U/L (ref 0–37)
CO2: 26 mEq/L (ref 19–32)
Calcium: 9.2 mg/dL (ref 8.4–10.5)
Creatinine, Ser: 0.8 mg/dL (ref 0.4–1.2)
GFR calc Af Amer: >60 mL/min (ref >60)
GFR calc non Af Amer: >60 mL/min (ref >60)
Sodium: 142 mEq/L (ref 135–145)
Total Protein: 7.1 g/dL (ref 6.0–8.3)

## 2011-03-16 LAB — URINALYSIS, ROUTINE W REFLEX MICROSCOPIC
Glucose, UA: NEGATIVE mg/dL
Leukocytes, UA: NEGATIVE
Nitrite: NEGATIVE
Nitrite: NEGATIVE
Protein, ur: NEGATIVE mg/dL
Specific Gravity, Urine: 1.023 (ref 1.005–1.030)
Urobilinogen, UA: 1 mg/dL (ref 0.0–1.0)
pH: 5.5 (ref 5.0–8.0)
pH: 6 (ref 5.0–8.0)

## 2011-03-16 LAB — BASIC METABOLIC PANEL
CO2: 26 mEq/L (ref 19–32)
Calcium: 9.3 mg/dL (ref 8.4–10.5)
GFR calc Af Amer: >60 mL/min (ref >60)
GFR calc non Af Amer: >60 mL/min (ref >60)
Potassium: 3.8 mEq/L (ref 3.5–5.1)
Sodium: 139 mEq/L (ref 135–145)

## 2011-03-16 LAB — CBC
HCT: 38.6 % (ref 36.0–46.0)
Hemoglobin: 13.1 g/dL (ref 12.0–15.0)
MCHC: 34.4 g/dL (ref 30.0–36.0)
MCV: 88.6 fL (ref 78.0–100.0)
Platelets: 100 10*3/uL — ABNORMAL LOW (ref 150–400)
RBC: 4.26 MIL/uL (ref 3.87–5.11)
RBC: 4.32 MIL/uL (ref 3.87–5.11)
RDW: 12.8 % (ref 11.5–15.5)

## 2011-03-16 LAB — HEMOCCULT GUIAC POC 1CARD (OFFICE): Fecal Occult Bld: NEGATIVE

## 2011-03-16 LAB — URINE MICROSCOPIC-ADD ON

## 2011-03-16 LAB — LIPASE, BLOOD: Lipase: 234 U/L (ref 23–300)

## 2011-03-17 LAB — URINALYSIS, ROUTINE W REFLEX MICROSCOPIC
Glucose, UA: NEGATIVE mg/dL
Hgb urine dipstick: NEGATIVE
Specific Gravity, Urine: 1.026 (ref 1.005–1.030)
pH: 8.5 — ABNORMAL HIGH (ref 5.0–8.0)

## 2011-03-18 LAB — DIFFERENTIAL
Lymphocytes Relative: 39 % (ref 12–46)
Lymphs Abs: 2.9 10*3/uL (ref 0.7–4.0)
Monocytes Absolute: 0.4 10*3/uL (ref 0.1–1.0)
Monocytes Relative: 6 % (ref 3–12)
Neutro Abs: 4 10*3/uL (ref 1.7–7.7)

## 2011-03-18 LAB — BASIC METABOLIC PANEL
CO2: 29 mEq/L (ref 19–32)
Calcium: 9.2 mg/dL (ref 8.4–10.5)
GFR calc Af Amer: >60 mL/min (ref >60)
GFR calc non Af Amer: >60 mL/min (ref >60)
Sodium: 141 mEq/L (ref 135–145)

## 2011-03-18 LAB — URINALYSIS, ROUTINE W REFLEX MICROSCOPIC
Bilirubin Urine: NEGATIVE
Hgb urine dipstick: NEGATIVE
Ketones, ur: NEGATIVE mg/dL
Leukocytes, UA: NEGATIVE
Nitrite: NEGATIVE
Specific Gravity, Urine: 1.023 (ref 1.005–1.030)
Specific Gravity, Urine: 1.024 (ref 1.005–1.030)
Urobilinogen, UA: 1 mg/dL (ref 0.0–1.0)
pH: 7.5 (ref 5.0–8.0)

## 2011-03-18 LAB — URINE MICROSCOPIC-ADD ON

## 2011-03-18 LAB — CBC
Hemoglobin: 13.2 g/dL (ref 12.0–15.0)
MCHC: 34.7 g/dL (ref 30.0–36.0)
RBC: 4.24 MIL/uL (ref 3.87–5.11)
WBC: 7.5 10*3/uL (ref 4.0–10.5)

## 2011-03-18 LAB — PREGNANCY, URINE: Preg Test, Ur: NEGATIVE

## 2011-03-23 ENCOUNTER — Emergency Department (HOSPITAL_BASED_OUTPATIENT_CLINIC_OR_DEPARTMENT_OTHER)
Admission: EM | Admit: 2011-03-23 | Discharge: 2011-03-23 | Disposition: A | Discharge status: Home / Self Care | Payer: Managed Care, Other (non HMO) | Attending: Emergency Medicine | Admitting: Emergency Medicine

## 2011-03-23 DIAGNOSIS — N200 Calculus of kidney: Secondary | ICD-10-CM | POA: Insufficient documentation

## 2011-03-23 DIAGNOSIS — E119 Type 2 diabetes mellitus without complications: Secondary | ICD-10-CM | POA: Insufficient documentation

## 2011-03-23 DIAGNOSIS — R109 Unspecified abdominal pain: Secondary | ICD-10-CM | POA: Insufficient documentation

## 2011-03-23 LAB — DIFFERENTIAL
Basophils Absolute: 0.1 10*3/uL (ref 0.0–0.1)
Eosinophils Relative: 2 % (ref 0–5)
Lymphocytes Relative: 38 % (ref 12–46)
Monocytes Absolute: 0.4 10*3/uL (ref 0.1–1.0)
Monocytes Relative: 5 % (ref 3–12)

## 2011-03-23 LAB — BASIC METABOLIC PANEL
CO2: 34 mEq/L — ABNORMAL HIGH (ref 19–32)
GFR calc Af Amer: >60 mL/min (ref >60)
GFR calc non Af Amer: >60 mL/min (ref >60)
Glucose, Bld: 164 mg/dL — ABNORMAL HIGH (ref 70–99)
Potassium: 3.4 mEq/L — ABNORMAL LOW (ref 3.5–5.1)
Sodium: 141 mEq/L (ref 135–145)

## 2011-03-23 LAB — URINE MICROSCOPIC-ADD ON

## 2011-03-23 LAB — URINALYSIS, ROUTINE W REFLEX MICROSCOPIC
Bilirubin Urine: NEGATIVE
Hgb urine dipstick: NEGATIVE
Ketones, ur: NEGATIVE mg/dL
Nitrite: NEGATIVE
Specific Gravity, Urine: 1.026 (ref 1.005–1.030)
pH: 6 (ref 5.0–8.0)
pH: 5.5 (ref 5.0–8.0)

## 2011-03-23 LAB — CBC
HCT: 37 % (ref 36.0–46.0)
Hemoglobin: 12.6 g/dL (ref 12.0–15.0)
MCHC: 34.2 g/dL (ref 30.0–36.0)
RBC: 4.08 MIL/uL (ref 3.87–5.11)
RDW: 12.6 % (ref 11.5–15.5)

## 2011-03-25 ENCOUNTER — Emergency Department (HOSPITAL_BASED_OUTPATIENT_CLINIC_OR_DEPARTMENT_OTHER)
Admission: EM | Admit: 2011-03-25 | Discharge: 2011-03-26 | Disposition: A | Discharge status: Home / Self Care | Payer: Managed Care, Other (non HMO) | Attending: Emergency Medicine | Admitting: Emergency Medicine

## 2011-03-25 ENCOUNTER — Emergency Department (INDEPENDENT_AMBULATORY_CARE_PROVIDER_SITE_OTHER): Payer: Managed Care, Other (non HMO)

## 2011-03-25 DIAGNOSIS — Z87442 Personal history of urinary calculi: Secondary | ICD-10-CM

## 2011-03-25 DIAGNOSIS — R109 Unspecified abdominal pain: Secondary | ICD-10-CM

## 2011-03-25 DIAGNOSIS — E119 Type 2 diabetes mellitus without complications: Secondary | ICD-10-CM | POA: Insufficient documentation

## 2011-03-25 LAB — URINE MICROSCOPIC-ADD ON

## 2011-03-25 LAB — CBC
Hemoglobin: 13.1 g/dL (ref 12.0–15.0)
MCH: 29.4 pg (ref 26.0–34.0)
MCHC: 34.4 g/dL (ref 30.0–36.0)
RDW: 12.8 % (ref 11.5–15.5)

## 2011-03-25 LAB — BASIC METABOLIC PANEL
BUN: 15 mg/dL (ref 6–23)
Chloride: 106 mEq/L (ref 96–112)
Glucose, Bld: 169 mg/dL — ABNORMAL HIGH (ref 70–99)
Potassium: 3.7 mEq/L (ref 3.5–5.1)

## 2011-03-25 LAB — DIFFERENTIAL
Basophils Absolute: 0 10*3/uL (ref 0.0–0.1)
Basophils Relative: 1 % (ref 0–1)
Eosinophils Relative: 2 % (ref 0–5)
Monocytes Absolute: 0.3 10*3/uL (ref 0.1–1.0)
Monocytes Relative: 6 % (ref 3–12)

## 2011-03-25 LAB — URINALYSIS, ROUTINE W REFLEX MICROSCOPIC
Bilirubin Urine: NEGATIVE
Leukocytes, UA: NEGATIVE
Nitrite: NEGATIVE
Specific Gravity, Urine: 1.024 (ref 1.005–1.030)
Urobilinogen, UA: 0.2 mg/dL (ref 0.0–1.0)
pH: 5.5 (ref 5.0–8.0)

## 2011-03-28 LAB — URINE CULTURE: Culture  Setup Time: 201203290628

## 2011-03-30 LAB — BASIC METABOLIC PANEL
BUN: 11 mg/dL (ref 6–23)
Chloride: 99 mEq/L (ref 96–112)
Creatinine, Ser: 0.8 mg/dL (ref 0.4–1.2)
GFR calc non Af Amer: >60 mL/min (ref >60)

## 2011-03-30 LAB — URINALYSIS, ROUTINE W REFLEX MICROSCOPIC
Ketones, ur: NEGATIVE mg/dL
Nitrite: NEGATIVE
Protein, ur: NEGATIVE mg/dL
Urobilinogen, UA: 1 mg/dL (ref 0.0–1.0)

## 2011-03-30 LAB — DIFFERENTIAL
Eosinophils Absolute: 0.1 10*3/uL (ref 0.0–0.7)
Lymphs Abs: 2.7 10*3/uL (ref 0.7–4.0)
Neutrophils Relative %: 57 % (ref 43–77)

## 2011-03-30 LAB — CBC
MCV: 88.1 fL (ref 78.0–100.0)
Platelets: 121 10*3/uL — ABNORMAL LOW (ref 150–400)
WBC: 7.6 10*3/uL (ref 4.0–10.5)

## 2011-04-01 LAB — COMPREHENSIVE METABOLIC PANEL
ALT: 17 U/L (ref 0–35)
Albumin: 5 g/dL (ref 3.5–5.2)
Alkaline Phosphatase: 59 U/L (ref 39–117)
GFR calc Af Amer: >60 mL/min (ref >60)
Potassium: 3.4 mEq/L — ABNORMAL LOW (ref 3.5–5.1)
Sodium: 142 mEq/L (ref 135–145)
Total Protein: 8 g/dL (ref 6.0–8.3)

## 2011-04-01 LAB — GLUCOSE, CAPILLARY: Glucose-Capillary: 188 mg/dL — ABNORMAL HIGH (ref 70–99)

## 2011-04-01 LAB — URINALYSIS, ROUTINE W REFLEX MICROSCOPIC
Glucose, UA: NEGATIVE mg/dL
Nitrite: NEGATIVE
Protein, ur: NEGATIVE mg/dL
Urobilinogen, UA: 4 mg/dL — ABNORMAL HIGH (ref 0.0–1.0)

## 2011-04-01 LAB — DIFFERENTIAL
Basophils Relative: 1 % (ref 0–1)
Eosinophils Absolute: 0.2 10*3/uL (ref 0.0–0.7)
Lymphs Abs: 3.2 10*3/uL (ref 0.7–4.0)
Monocytes Absolute: 0.3 10*3/uL (ref 0.1–1.0)
Monocytes Relative: 4 % (ref 3–12)

## 2011-04-01 LAB — CBC
Platelets: 149 10*3/uL — ABNORMAL LOW (ref 150–400)
RDW: 12.1 % (ref 11.5–15.5)

## 2011-04-02 LAB — DIFFERENTIAL
Basophils Relative: 1 % (ref 0–1)
Eosinophils Absolute: 0.2 10*3/uL (ref 0.0–0.7)
Eosinophils Relative: 2 % (ref 0–5)
Lymphs Abs: 2.9 10*3/uL (ref 0.7–4.0)
Monocytes Absolute: 0.4 10*3/uL (ref 0.1–1.0)
Neutro Abs: 3.9 10*3/uL (ref 1.7–7.7)

## 2011-04-02 LAB — URINALYSIS, ROUTINE W REFLEX MICROSCOPIC
Glucose, UA: NEGATIVE mg/dL
Ketones, ur: NEGATIVE mg/dL
Protein, ur: 30 mg/dL — AB
pH: 6 (ref 5.0–8.0)

## 2011-04-02 LAB — CBC
Hemoglobin: 13.3 g/dL (ref 12.0–15.0)
RDW: 12.3 % (ref 11.5–15.5)

## 2011-04-02 LAB — URINE MICROSCOPIC-ADD ON

## 2011-04-02 LAB — COMPREHENSIVE METABOLIC PANEL
ALT: 23 U/L (ref 0–35)
AST: 34 U/L (ref 0–37)
Albumin: 4.3 g/dL (ref 3.5–5.2)
Alkaline Phosphatase: 54 U/L (ref 39–117)
Calcium: 9.5 mg/dL (ref 8.4–10.5)
GFR calc Af Amer: >60 mL/min (ref >60)
Glucose, Bld: 222 mg/dL — ABNORMAL HIGH (ref 70–99)
Potassium: 3.8 mEq/L (ref 3.5–5.1)
Sodium: 138 mEq/L (ref 135–145)
Total Protein: 6.9 g/dL (ref 6.0–8.3)

## 2011-04-05 LAB — BASIC METABOLIC PANEL
BUN: 7 mg/dL (ref 6–23)
Chloride: 101 mEq/L (ref 96–112)
GFR calc non Af Amer: >60 mL/min (ref >60)
Glucose, Bld: 302 mg/dL — ABNORMAL HIGH (ref 70–99)
Potassium: 4.4 mEq/L (ref 3.5–5.1)
Sodium: 140 mEq/L (ref 135–145)

## 2011-04-30 ENCOUNTER — Emergency Department (HOSPITAL_BASED_OUTPATIENT_CLINIC_OR_DEPARTMENT_OTHER)
Admission: EM | Admit: 2011-04-30 | Discharge: 2011-04-30 | Disposition: A | Discharge status: Home / Self Care | Payer: Managed Care, Other (non HMO) | Attending: Emergency Medicine | Admitting: Emergency Medicine

## 2011-04-30 ENCOUNTER — Emergency Department (INDEPENDENT_AMBULATORY_CARE_PROVIDER_SITE_OTHER): Payer: Managed Care, Other (non HMO)

## 2011-04-30 DIAGNOSIS — E119 Type 2 diabetes mellitus without complications: Secondary | ICD-10-CM | POA: Insufficient documentation

## 2011-04-30 DIAGNOSIS — Z9884 Bariatric surgery status: Secondary | ICD-10-CM

## 2011-04-30 DIAGNOSIS — R1031 Right lower quadrant pain: Secondary | ICD-10-CM | POA: Insufficient documentation

## 2011-04-30 DIAGNOSIS — K7460 Unspecified cirrhosis of liver: Secondary | ICD-10-CM | POA: Insufficient documentation

## 2011-04-30 DIAGNOSIS — R112 Nausea with vomiting, unspecified: Secondary | ICD-10-CM

## 2011-04-30 DIAGNOSIS — R109 Unspecified abdominal pain: Secondary | ICD-10-CM

## 2011-04-30 DIAGNOSIS — Z9089 Acquired absence of other organs: Secondary | ICD-10-CM

## 2011-04-30 LAB — URINALYSIS, ROUTINE W REFLEX MICROSCOPIC
Bilirubin Urine: NEGATIVE
Glucose, UA: NEGATIVE mg/dL
Ketones, ur: NEGATIVE mg/dL
Nitrite: NEGATIVE
Specific Gravity, Urine: 1.028 (ref 1.005–1.030)
pH: 5.5 (ref 5.0–8.0)

## 2011-04-30 LAB — CBC
HCT: 40.1 % (ref 36.0–46.0)
Hemoglobin: 14.2 g/dL (ref 12.0–15.0)
RBC: 4.78 MIL/uL (ref 3.87–5.11)

## 2011-04-30 LAB — DIFFERENTIAL
Basophils Absolute: 0 10*3/uL (ref 0.0–0.1)
Basophils Relative: 1 % (ref 0–1)
Lymphocytes Relative: 49 % — ABNORMAL HIGH (ref 12–46)
Monocytes Absolute: 0.3 10*3/uL (ref 0.1–1.0)
Monocytes Relative: 7 % (ref 3–12)
Neutro Abs: 2.2 10*3/uL (ref 1.7–7.7)
Neutrophils Relative %: 42 % — ABNORMAL LOW (ref 43–77)

## 2011-04-30 LAB — COMPREHENSIVE METABOLIC PANEL
ALT: 16 U/L (ref 0–35)
AST: 23 U/L (ref 0–37)
Alkaline Phosphatase: 70 U/L (ref 39–117)
CO2: 27 mEq/L (ref 19–32)
Chloride: 97 mEq/L (ref 96–112)
GFR calc Af Amer: >60 mL/min (ref >60)
GFR calc non Af Amer: >60 mL/min (ref >60)
Glucose, Bld: 137 mg/dL — ABNORMAL HIGH (ref 70–99)
Sodium: 137 mEq/L (ref 135–145)
Total Bilirubin: 0.8 mg/dL (ref 0.3–1.2)

## 2011-04-30 MED ORDER — IOHEXOL 300 MG/ML  SOLN
100.0000 mL | Freq: Once | INTRAMUSCULAR | Status: AC | PRN
  Administered 2011-04-30: 100 mL via INTRAVENOUS

## 2011-06-01 ENCOUNTER — Ambulatory Visit (INDEPENDENT_AMBULATORY_CARE_PROVIDER_SITE_OTHER): Payer: Managed Care, Other (non HMO) | Admitting: Family Medicine

## 2011-06-01 ENCOUNTER — Encounter: Payer: Self-pay | Admitting: Family Medicine

## 2011-06-01 DIAGNOSIS — E119 Type 2 diabetes mellitus without complications: Secondary | ICD-10-CM | POA: Insufficient documentation

## 2011-06-01 DIAGNOSIS — Z9884 Bariatric surgery status: Secondary | ICD-10-CM

## 2011-06-01 DIAGNOSIS — K760 Fatty (change of) liver, not elsewhere classified: Secondary | ICD-10-CM

## 2011-06-01 DIAGNOSIS — K7689 Other specified diseases of liver: Secondary | ICD-10-CM

## 2011-06-01 HISTORY — DX: Bariatric surgery status: Z98.84

## 2011-06-01 HISTORY — DX: Type 2 diabetes mellitus without complications: E11.9

## 2011-06-01 LAB — BASIC METABOLIC PANEL
BUN: 14 mg/dL (ref 6–23)
Calcium: 9.2 mg/dL (ref 8.4–10.5)
Chloride: 99 mEq/L (ref 96–112)
Creatinine, Ser: 0.9 mg/dL (ref 0.4–1.2)
GFR: 73.11 mL/min (ref >60.00)

## 2011-06-01 LAB — HEPATIC FUNCTION PANEL
Bilirubin, Direct: 0.2 mg/dL (ref 0.0–0.3)
Total Bilirubin: 1.3 mg/dL — ABNORMAL HIGH (ref 0.3–1.2)

## 2011-06-01 LAB — CBC WITH DIFFERENTIAL/PLATELET
Basophils Absolute: 0 10*3/uL (ref 0.0–0.1)
Eosinophils Absolute: 0.1 10*3/uL (ref 0.0–0.7)
Hemoglobin: 14.5 g/dL (ref 12.0–15.0)
Lymphocytes Relative: 37.8 % (ref 12.0–46.0)
MCHC: 34.9 g/dL (ref 30.0–36.0)
MCV: 88.1 fl (ref 78.0–100.0)
Monocytes Absolute: 0.3 10*3/uL (ref 0.1–1.0)
Neutro Abs: 3.1 10*3/uL (ref 1.4–7.7)
RDW: 13 % (ref 11.5–14.6)

## 2011-06-01 LAB — LIPID PANEL
Cholesterol: 296 mg/dL — ABNORMAL HIGH (ref 0–200)
HDL: 51.8 mg/dL (ref >39.00)
Total CHOL/HDL Ratio: 6
Triglycerides: 144 mg/dL (ref 0.0–149.0)
VLDL: 28.8 mg/dL (ref 0.0–40.0)

## 2011-06-01 LAB — TSH: TSH: 1.62 u[IU]/mL (ref 0.35–5.50)

## 2011-06-01 NOTE — Assessment & Plan Note (Signed)
Will check labs to assess pt's level of control.  Pt not on ACE- BP already low.  Pt is exercising regularly and has completely changed diet since gastric bypass.  Applauded weight loss efforts.

## 2011-06-01 NOTE — Progress Notes (Signed)
  Subjective:    Patient ID: [name], female    DOB: Oct 31, 1971, 40 y.o.   MRN: [id number]  HPI New to establish.  Previous MD- [facility].  GYN- none since hysterectomy.  S/p gastric bypass- has lost 65 lbs since Nov.  'i feel so much better'.  Has f/u w/ Dr Ezzard Standing in July.  Taking vitamin supplements.  Diabetes- last visit was December, was taken off all meds except Actos at that time.  Has had considerable weight loss.  Due for labs.  Denies symptomatic lows recently but has had a few instances of feeling 'shakey'.  Denies CP, SOB, HAs, visual changes, edema.  Non-alcoholic fatty liver dz- was main impetus behind gastric bypass.  Saw specialist from [facility] who was unpleasant and did not follow up.  Denies nausea, vomiting, abd pain.  Has lost a lot of weight and does not drink ETOH.   Review of Systems For ROS see HPI     Objective:   Physical Exam  Constitutional: She is oriented to person, place, and time. She appears well-developed and well-nourished. No distress.  HENT:  Head: Normocephalic and atraumatic.  Eyes: Conjunctivae and EOM are normal. Pupils are equal, round, and reactive to light.  Neck: Normal range of motion. Neck supple. No thyromegaly present.  Cardiovascular: Normal rate, regular rhythm, normal heart sounds and intact distal pulses.   No murmur heard. Pulmonary/Chest: Effort normal and breath sounds normal. No respiratory distress.  Abdominal: Soft. She exhibits no distension. There is no tenderness.  Musculoskeletal: She exhibits no edema.  Lymphadenopathy:    She has no cervical adenopathy.  Neurological: She is alert and oriented to person, place, and time.  Skin: Skin is warm and dry.  Psychiatric: She has a normal mood and affect. Her behavior is normal.          Assessment & Plan:

## 2011-06-01 NOTE — Assessment & Plan Note (Signed)
Will check labs and determine whether pt needs specialist follow up.  Encouraged her to continue healthy diet and regular exercise as weight loss is pt's best defense against further damage.  Pt expressed understanding and is in agreement w/ plan.

## 2011-06-01 NOTE — Patient Instructions (Signed)
Schedule your complete physical in 3 months We'll notify you of your lab results Keep up the good work on diet and exercise- I'm so proud of you!!! Call with any questions or concerns Welcome!  We're glad to have you!!!

## 2011-06-01 NOTE — Assessment & Plan Note (Signed)
Will check labs to ensure that pt is not vitamin deficit.  Applauded her efforts.

## 2011-06-02 ENCOUNTER — Encounter: Payer: Self-pay | Admitting: *Deleted

## 2011-06-02 MED ORDER — METFORMIN HCL 500 MG PO TABS: 500.0000 mg | ORAL_TABLET | Freq: Two times a day (BID) | ORAL | Status: DC

## 2011-06-02 MED ORDER — ROSUVASTATIN CALCIUM 20 MG PO TABS: 20.0000 mg | ORAL_TABLET | Freq: Every day | ORAL | Status: DC

## 2011-06-02 NOTE — Progress Notes (Signed)
Addended by: Lucious Groves I on: 06/02/2011 02:07 PM   Modules accepted: Orders

## 2011-06-04 ENCOUNTER — Encounter: Payer: Self-pay | Admitting: *Deleted

## 2011-06-11 ENCOUNTER — Encounter: Payer: Self-pay | Admitting: Family Medicine

## 2011-06-11 ENCOUNTER — Ambulatory Visit (INDEPENDENT_AMBULATORY_CARE_PROVIDER_SITE_OTHER): Payer: Managed Care, Other (non HMO) | Admitting: Family Medicine

## 2011-06-11 VITALS — BP 100/70 | Temp 98.6°F | Wt 208.8 lb

## 2011-06-11 DIAGNOSIS — R300 Dysuria: Secondary | ICD-10-CM | POA: Insufficient documentation

## 2011-06-11 DIAGNOSIS — R319 Hematuria, unspecified: Secondary | ICD-10-CM

## 2011-06-11 LAB — POCT URINALYSIS DIPSTICK
Protein, UA: NEGATIVE
Spec Grav, UA: 1.03
Urobilinogen, UA: 0.2

## 2011-06-11 MED ORDER — CIPROFLOXACIN HCL 500 MG PO TABS: 500.0000 mg | ORAL_TABLET | Freq: Two times a day (BID) | ORAL | Status: AC

## 2011-06-11 NOTE — Patient Instructions (Signed)
This is most likely a bladder infection Take the Cipro as directed- take w/ food to avoid upset stomach Drink plenty of fluids! Your emotions are completely normal!  If you find the bad is outweighing the good- call me! Call with any questions or concerns Hang in there!

## 2011-06-11 NOTE — Progress Notes (Signed)
  Subjective:    Patient ID: [name], female    DOB: Apr 30, 1971, 40 y.o.   MRN: [id number]  HPI R sided abd pain- pain is RLQ.  Started yesterday at 11am.  Had multiple sharp, stabbing pains throughout the day.  Today the pain has eased to 'dull ache'.  No fever.  + nausea w/ sharp pains but no vomiting.  No diarrhea.  Ovaries remain but no cycle due to hysterectomy.  Having burning w/ urination.  Hx of kidney stones but this does not feel similar.  Went to ER 1 month ago for similar.   Review of Systems For ROS see HPI     Objective:   Physical Exam  Constitutional: She is oriented to person, place, and time. She appears well-developed and well-nourished. No distress.  Cardiovascular: Normal rate, regular rhythm, normal heart sounds and intact distal pulses.   Pulmonary/Chest: Effort normal and breath sounds normal. No respiratory distress. She has no wheezes. She has no rales.  Abdominal: Soft. Bowel sounds are normal. She exhibits no distension. There is tenderness (suprapubic tenderness, no TTP over McBurney's point). There is no rebound and no guarding.  Neurological: She is alert and oriented to person, place, and time.  Skin: Skin is warm and dry.          Assessment & Plan:

## 2011-06-13 LAB — URINE CULTURE: Colony Count: 40000

## 2011-06-15 NOTE — Assessment & Plan Note (Signed)
Pt's sxs and UA consistent w/ UTI.  No TTP over McBurney's point, afebrile- not likely appendicitis.  Start abx.  Reviewed supportive care and red flags that should prompt return.  Pt expressed understanding and is in agreement w/ plan.

## 2011-06-17 ENCOUNTER — Other Ambulatory Visit: Payer: Self-pay | Admitting: Family Medicine

## 2011-06-17 DIAGNOSIS — N390 Urinary tract infection, site not specified: Secondary | ICD-10-CM

## 2011-06-18 ENCOUNTER — Other Ambulatory Visit (INDEPENDENT_AMBULATORY_CARE_PROVIDER_SITE_OTHER): Payer: Managed Care, Other (non HMO)

## 2011-06-18 ENCOUNTER — Encounter: Payer: Self-pay | Admitting: *Deleted

## 2011-06-18 DIAGNOSIS — N390 Urinary tract infection, site not specified: Secondary | ICD-10-CM

## 2011-06-18 LAB — POCT URINALYSIS DIPSTICK
Bilirubin, UA: NEGATIVE
Ketones, UA: NEGATIVE
Leukocytes, UA: NEGATIVE
pH, UA: 6

## 2011-06-18 NOTE — Progress Notes (Signed)
Labs only

## 2011-06-19 LAB — URINE CULTURE: Colony Count: 7000

## 2011-06-23 ENCOUNTER — Telehealth: Payer: Self-pay

## 2011-06-23 DIAGNOSIS — N390 Urinary tract infection, site not specified: Secondary | ICD-10-CM

## 2011-06-23 DIAGNOSIS — R300 Dysuria: Secondary | ICD-10-CM

## 2011-06-23 NOTE — Telephone Encounter (Signed)
Ok to send to urology

## 2011-06-23 NOTE — Telephone Encounter (Signed)
Call from patient and she is still having urinary discomfort and side pain. She wanted to get a referral to the Urologist as discussed at her OV. Please advise     KP

## 2011-06-23 NOTE — Telephone Encounter (Signed)
Done

## 2011-06-24 ENCOUNTER — Other Ambulatory Visit: Payer: Self-pay | Admitting: Family Medicine

## 2011-06-24 ENCOUNTER — Ambulatory Visit (INDEPENDENT_AMBULATORY_CARE_PROVIDER_SITE_OTHER)
Admission: RE | Admit: 2011-06-24 | Discharge: 2011-06-24 | Disposition: A | Discharge status: Home / Self Care | Payer: Managed Care, Other (non HMO) | Source: Ambulatory Visit | Attending: Family Medicine | Admitting: Family Medicine

## 2011-06-24 ENCOUNTER — Ambulatory Visit (HOSPITAL_BASED_OUTPATIENT_CLINIC_OR_DEPARTMENT_OTHER)
Admission: RE | Admit: 2011-06-24 | Discharge: 2011-06-24 | Disposition: A | Discharge status: Home / Self Care | Payer: Managed Care, Other (non HMO) | Source: Ambulatory Visit | Attending: Family Medicine | Admitting: Family Medicine

## 2011-06-24 ENCOUNTER — Ambulatory Visit (INDEPENDENT_AMBULATORY_CARE_PROVIDER_SITE_OTHER): Payer: Managed Care, Other (non HMO) | Admitting: Family Medicine

## 2011-06-24 ENCOUNTER — Encounter: Payer: Self-pay | Admitting: Family Medicine

## 2011-06-24 VITALS — BP 106/70 | Temp 99.0°F | Wt 206.2 lb

## 2011-06-24 DIAGNOSIS — R300 Dysuria: Secondary | ICD-10-CM

## 2011-06-24 DIAGNOSIS — R102 Pelvic and perineal pain: Secondary | ICD-10-CM

## 2011-06-24 DIAGNOSIS — N949 Unspecified condition associated with female genital organs and menstrual cycle: Secondary | ICD-10-CM

## 2011-06-24 DIAGNOSIS — R109 Unspecified abdominal pain: Secondary | ICD-10-CM

## 2011-06-24 HISTORY — DX: Pelvic and perineal pain: R10.2

## 2011-06-24 LAB — POCT URINALYSIS DIPSTICK
Bilirubin, UA: NEGATIVE
Blood, UA: NEGATIVE
Nitrite, UA: NEGATIVE
Spec Grav, UA: 1.015
pH, UA: 6.5

## 2011-06-24 MED ORDER — TRAMADOL HCL 50 MG PO TABS: 50.0000 mg | ORAL_TABLET | Freq: Four times a day (QID) | ORAL | Status: DC | PRN

## 2011-06-24 NOTE — Progress Notes (Signed)
  Subjective:    Patient ID: [name], female    DOB: [birth year], 40 y.o.   MRN: [id number]  HPI Suprapubic pain- R sided.  sxs started 'a couple weeks ago'.  Reports pain is now constant.  Usually a dull ache but after urinating 'it's a bend over and cry type of pain'.  Having some dysuria.  'i feel like i need to pee more than i am- it doesn't feel like relief'.  Has previously seen urology for kidney stones- Dr Sabino Gasser ([facility]).  No fevers.  Had some blood w/ wiping yesterday.   Review of Systems For ROS see HPI     Objective:   Physical Exam  Constitutional: She appears well-developed and well-nourished.       Uncomfortable appearing but not distressed  Abdominal: Soft. Bowel sounds are normal. She exhibits no distension. There is Tenderness: no CVA tenderness, no TTP over McBurney's point, + TTP in R suprapubic area.. There is no rebound and no guarding.          Assessment & Plan:

## 2011-06-24 NOTE — Patient Instructions (Signed)
We'll notify you of your ultrasound results Someone will call you with your urology appt Make sure you are drinking plenty of fluids Take the Ultram as needed for pain Hang in there!

## 2011-06-27 LAB — URINE CULTURE

## 2011-07-05 ENCOUNTER — Emergency Department (HOSPITAL_BASED_OUTPATIENT_CLINIC_OR_DEPARTMENT_OTHER)
Admission: EM | Admit: 2011-07-05 | Discharge: 2011-07-05 | Disposition: A | Discharge status: Home / Self Care | Payer: Managed Care, Other (non HMO) | Attending: Emergency Medicine | Admitting: Emergency Medicine

## 2011-07-05 ENCOUNTER — Encounter (HOSPITAL_BASED_OUTPATIENT_CLINIC_OR_DEPARTMENT_OTHER): Payer: Self-pay | Admitting: *Deleted

## 2011-07-05 DIAGNOSIS — E119 Type 2 diabetes mellitus without complications: Secondary | ICD-10-CM | POA: Insufficient documentation

## 2011-07-05 DIAGNOSIS — E785 Hyperlipidemia, unspecified: Secondary | ICD-10-CM | POA: Insufficient documentation

## 2011-07-05 DIAGNOSIS — N23 Unspecified renal colic: Secondary | ICD-10-CM | POA: Insufficient documentation

## 2011-07-05 DIAGNOSIS — K7689 Other specified diseases of liver: Secondary | ICD-10-CM | POA: Insufficient documentation

## 2011-07-05 LAB — URINALYSIS, MICROSCOPIC ONLY
Ketones, ur: NEGATIVE mg/dL
Leukocytes, UA: NEGATIVE
Nitrite: NEGATIVE
Protein, ur: NEGATIVE mg/dL
pH: 5.5 (ref 5.0–8.0)

## 2011-07-05 MED ORDER — ONDANSETRON HCL 4 MG/2ML IJ SOLN
4.0000 mg | Freq: Once | INTRAMUSCULAR | Status: AC
  Administered 2011-07-05: 4 mg via INTRAVENOUS
  Filled 2011-07-05: qty 2

## 2011-07-05 MED ORDER — NAPROXEN SODIUM 220 MG PO TABS: 440.0000 mg | ORAL_TABLET | Freq: Two times a day (BID) | ORAL | Status: DC

## 2011-07-05 MED ORDER — KETOROLAC TROMETHAMINE 30 MG/ML IJ SOLN
30.0000 mg | Freq: Once | INTRAMUSCULAR | Status: AC
  Administered 2011-07-05: 30 mg via INTRAVENOUS
  Filled 2011-07-05: qty 1

## 2011-07-05 MED ORDER — ONDANSETRON HCL 4 MG PO TABS: 4.0000 mg | ORAL_TABLET | Freq: Four times a day (QID) | ORAL | Status: AC

## 2011-07-05 MED ORDER — HYDROMORPHONE HCL 1 MG/ML IJ SOLN
1.0000 mg | Freq: Two times a day (BID) | INTRAMUSCULAR | Status: DC | PRN
  Administered 2011-07-05 (×2): 1 mg via INTRAVENOUS
  Filled 2011-07-05 (×2): qty 1

## 2011-07-05 MED ORDER — HYDROMORPHONE HCL 4 MG PO TABS: 4.0000 mg | ORAL_TABLET | ORAL | Status: AC | PRN

## 2011-07-05 NOTE — ED Provider Notes (Signed)
History     Chief Complaint  Patient presents with  . Back Pain    "woke up w/ sharp pains in back and side", states she has hx of kidney stones   Patient is a 40 y.o. female presenting with flank pain. The history is provided by the patient.  Flank Pain This is a new problem. The current episode started 1 to 2 hours ago. The problem occurs constantly. The problem has not changed since onset.The symptoms are aggravated by nothing. Relieved by: not relieved by passing a kidney stone PTA.    Past Medical History  Diagnosis Date  . Hyperlipidemia   . Diabetes mellitus   . Kidney stones   . Recurrent UTI   . Non-alcoholic fatty liver disease     Past Surgical History  Procedure Date  . Cholecystectomy 2005  . Partial hysterectomy 2008  . Kidney stones   . Gastric bypass 10/2010  . Tubal ligation     x3    Family History  Problem Relation Age of Onset  . Lung cancer Mother   . Hyperlipidemia Father   . Heart disease Father   . Stroke Father   . Hypertension Father   . Diabetes Father     History  Substance Use Topics  . Smoking status: Never Smoker   . Smokeless tobacco: Not on file  . Alcohol Use: No    OB History    Grav Para Term Preterm Abortions TAB SAB Ect Mult Living                  Review of Systems  Gastrointestinal: Positive for nausea. Negative for vomiting.  Genitourinary: Positive for hematuria and flank pain.  All other systems reviewed and are negative.    Physical Exam  BP 106/72  Pulse 77  Temp(Src) 98.6 F (37 C) (Oral)  Resp 20  SpO2 100%  Physical Exam  Constitutional: She appears well-developed and well-nourished.  HENT:  Head: Normocephalic and atraumatic.  Eyes: Conjunctivae are normal. Pupils are equal, round, and reactive to light.  Neck: Normal range of motion. Neck supple.  Cardiovascular: Normal rate and regular rhythm.   Pulmonary/Chest: Effort normal and breath sounds normal. No respiratory distress.  Abdominal:  Soft. There is no tenderness.  Musculoskeletal: Normal range of motion.  Neurological: She is alert. Coordination normal.  Skin: Skin is warm and dry.    ED Course  Procedures  MDM Pain controlled with IV meds. Patient has appointment with her urologist, Dr. Sabino Gasser in [facility], tomorrow. Suspect additional stone or clot causing persistent pain after passage of stone.      Hanley Seamen, MD 07/05/11 [phone]

## 2011-07-07 LAB — URINE CULTURE

## 2011-07-07 NOTE — Assessment & Plan Note (Signed)
UA w/out obvious abnormality.  Based on negative urine cx last time will hold off on abx and refer to urology.  Start Tramadol for pain.  Reviewed supportive care and red flags that should prompt return.  Pt expressed understanding and is in agreement w/ plan.

## 2011-07-07 NOTE — Assessment & Plan Note (Signed)
Pain not typical of kidney stone (pt w/ hx) and given that it seems to be located over R ovary will get [place] to assess.  Tramadol prn for pain.  Reviewed supportive care and red flags that should prompt return.  Pt expressed understanding and is in agreement w/ plan.

## 2011-07-24 ENCOUNTER — Emergency Department (INDEPENDENT_AMBULATORY_CARE_PROVIDER_SITE_OTHER): Payer: Managed Care, Other (non HMO)

## 2011-07-24 ENCOUNTER — Emergency Department (HOSPITAL_BASED_OUTPATIENT_CLINIC_OR_DEPARTMENT_OTHER)
Admission: EM | Admit: 2011-07-24 | Discharge: 2011-07-24 | Disposition: A | Discharge status: Home / Self Care | Payer: Managed Care, Other (non HMO) | Attending: Emergency Medicine | Admitting: Emergency Medicine

## 2011-07-24 ENCOUNTER — Telehealth (INDEPENDENT_AMBULATORY_CARE_PROVIDER_SITE_OTHER): Payer: Self-pay | Admitting: Surgery

## 2011-07-24 ENCOUNTER — Encounter (HOSPITAL_BASED_OUTPATIENT_CLINIC_OR_DEPARTMENT_OTHER): Payer: Self-pay | Admitting: Student

## 2011-07-24 DIAGNOSIS — R112 Nausea with vomiting, unspecified: Secondary | ICD-10-CM

## 2011-07-24 DIAGNOSIS — R1011 Right upper quadrant pain: Secondary | ICD-10-CM | POA: Insufficient documentation

## 2011-07-24 DIAGNOSIS — R109 Unspecified abdominal pain: Secondary | ICD-10-CM

## 2011-07-24 DIAGNOSIS — R1084 Generalized abdominal pain: Secondary | ICD-10-CM

## 2011-07-24 DIAGNOSIS — R1031 Right lower quadrant pain: Secondary | ICD-10-CM | POA: Insufficient documentation

## 2011-07-24 DIAGNOSIS — K7460 Unspecified cirrhosis of liver: Secondary | ICD-10-CM

## 2011-07-24 DIAGNOSIS — E785 Hyperlipidemia, unspecified: Secondary | ICD-10-CM | POA: Insufficient documentation

## 2011-07-24 DIAGNOSIS — R161 Splenomegaly, not elsewhere classified: Secondary | ICD-10-CM

## 2011-07-24 DIAGNOSIS — E119 Type 2 diabetes mellitus without complications: Secondary | ICD-10-CM | POA: Insufficient documentation

## 2011-07-24 DIAGNOSIS — Z9884 Bariatric surgery status: Secondary | ICD-10-CM

## 2011-07-24 LAB — DIFFERENTIAL
Eosinophils Relative: 2 % (ref 0–5)
Lymphocytes Relative: 39 % (ref 12–46)
Lymphs Abs: 2.5 10*3/uL (ref 0.7–4.0)
Monocytes Absolute: 0.4 10*3/uL (ref 0.1–1.0)
Neutro Abs: 3.3 10*3/uL (ref 1.7–7.7)

## 2011-07-24 LAB — CBC
HCT: 39.6 % (ref 36.0–46.0)
Hemoglobin: 13.9 g/dL (ref 12.0–15.0)
MCV: 85.2 fL (ref 78.0–100.0)
RBC: 4.65 MIL/uL (ref 3.87–5.11)
WBC: 6.3 10*3/uL (ref 4.0–10.5)

## 2011-07-24 LAB — URINALYSIS, ROUTINE W REFLEX MICROSCOPIC
Hgb urine dipstick: NEGATIVE
Nitrite: NEGATIVE
Protein, ur: NEGATIVE mg/dL
Specific Gravity, Urine: 1.023 (ref 1.005–1.030)
Urobilinogen, UA: 1 mg/dL (ref 0.0–1.0)

## 2011-07-24 LAB — COMPREHENSIVE METABOLIC PANEL
ALT: 16 U/L (ref 0–35)
AST: 24 U/L (ref 0–37)
CO2: 30 mEq/L (ref 19–32)
Calcium: 10.1 mg/dL (ref 8.4–10.5)
Chloride: 100 mEq/L (ref 96–112)
Creatinine, Ser: 0.7 mg/dL (ref 0.50–1.10)
GFR calc Af Amer: >60 mL/min (ref >60)
GFR calc non Af Amer: >60 mL/min (ref >60)
Glucose, Bld: 118 mg/dL — ABNORMAL HIGH (ref 70–99)
Total Bilirubin: 1 mg/dL (ref 0.3–1.2)

## 2011-07-24 MED ORDER — IOHEXOL 300 MG/ML  SOLN
100.0000 mL | Freq: Once | INTRAMUSCULAR | Status: AC | PRN
  Administered 2011-07-24: 100 mL via INTRAVENOUS

## 2011-07-24 MED ORDER — ONDANSETRON HCL 4 MG/2ML IJ SOLN
4.0000 mg | Freq: Once | INTRAMUSCULAR | Status: AC
  Administered 2011-07-24: 4 mg via INTRAVENOUS
  Filled 2011-07-24: qty 2

## 2011-07-24 MED ORDER — ONDANSETRON 8 MG PO TBDP: 8.0000 mg | ORAL_TABLET | Freq: Three times a day (TID) | ORAL | Status: AC | PRN

## 2011-07-24 MED ORDER — FENTANYL CITRATE 0.05 MG/ML IJ SOLN
50.0000 ug | Freq: Once | INTRAMUSCULAR | Status: AC
  Administered 2011-07-24: 50 ug via INTRAVENOUS
  Filled 2011-07-24: qty 2

## 2011-07-24 NOTE — ED Notes (Signed)
Patient is resting comfortably. Pt reports recurrent UTI and has HX of bladder tumors.

## 2011-07-24 NOTE — ED Notes (Signed)
Family at bedside. 

## 2011-07-24 NOTE — ED Provider Notes (Signed)
History     HPI [name] is 40 y.o. female c/o lower abdominal pain. Reports pain began gradually 1 month ago. States she had a loss of appetite. States for 5 days now pain has worsened and is now associated with n/v of solids and liquids. Denies fever, chills, diarrhea, constipation, dysuria, hematuria, vaginal discharge, or flank pain. Reports a 13lb weight loss in 11 days. Patient has a significant h/o a gastric bypass in Nov 2011. Reports surgery done by Dr. Ezzard Standing at [facility]. States she had a big drop in weight initially but has only been loosing 1-2 lbs a week since March 2012.   Past Medical History  Diagnosis Date  . Hyperlipidemia   . Diabetes mellitus   . Kidney stones   . Recurrent UTI   . Non-alcoholic fatty liver disease     Past Surgical History  Procedure Date  . Cholecystectomy 2005  . Partial hysterectomy 2008  . Kidney stones   . Gastric bypass 10/2010  . Tubal ligation     x3    Family History  Problem Relation Age of Onset  . Lung cancer Mother   . Hyperlipidemia Father   . Heart disease Father   . Stroke Father   . Hypertension Father   . Diabetes Father     History  Substance Use Topics  . Smoking status: Never Smoker   . Smokeless tobacco: Not on file  . Alcohol Use: No   No Known Allergies  Prior to Admission medications   Medication Sig Start Date End Date Taking? Authorizing Provider  potassium chloride (KLOR-CON) 20 MEQ packet Take 20 mEq by mouth 2 (two) times daily.     Yes Historical Provider, MD  metFORMIN (GLUCOPHAGE) 500 MG tablet Take 1 tablet (500 mg total) by mouth 2 (two) times daily with a meal. 06/02/11 06/01/12  Neena Rhymes, MD  naproxen sodium (ALEVE) 220 MG tablet Take 2 tablets (440 mg total) by mouth 2 (two) times daily with a meal. 07/05/11 07/04/12  John L Molpus, MD  pioglitazone (ACTOS) 45 MG tablet Take 45 mg by mouth daily.      Historical Provider, MD  rosuvastatin (CRESTOR) 20 MG tablet Take 1 tablet (20 mg total) by  mouth daily. 06/02/11 06/01/12  Neena Rhymes, MD  traMADol (ULTRAM) 50 MG tablet Take 1 tablet (50 mg total) by mouth every 6 (six) hours as needed for pain. 06/24/11 06/23/12  Neena Rhymes, MD     OB History    Grav Para Term Preterm Abortions TAB SAB Ect Mult Living                  Review of Systems  Constitutional: Positive for appetite change. Negative for fever, chills and fatigue.  Respiratory: Negative for shortness of breath.   Cardiovascular: Negative for chest pain.  Gastrointestinal: Positive for nausea, vomiting and abdominal pain. Negative for diarrhea and constipation.  Genitourinary: Negative for dysuria, urgency, frequency, hematuria, flank pain, vaginal discharge and vaginal pain.  Musculoskeletal: Negative for back pain.  All other systems reviewed and are negative.    Physical Exam  BP 104/75  Pulse 90  Temp(Src) 98.2 F (36.8 C) (Oral)  Resp 20  Wt 195 lb (88.451 kg)  SpO2 100%  Physical Exam  Vitals reviewed. Constitutional: She is oriented to person, place, and time. She appears well-developed and well-nourished. She does not appear ill. No distress.  HENT:  Head: Normocephalic and atraumatic.  Eyes: Conjunctivae and lids are normal.  Neck: Normal range of motion. Neck supple.  Cardiovascular: Normal rate, regular rhythm and normal heart sounds.  Exam reveals no friction rub.   No murmur heard. Pulmonary/Chest: Effort normal and breath sounds normal. She has no wheezes. She has no rales. She exhibits no tenderness.  Abdominal: Soft. Bowel sounds are normal. She exhibits no distension and no mass. There is no hepatosplenomegaly. There is tenderness in the right lower quadrant, suprapubic area and left lower quadrant. There is no rigidity, no rebound, no guarding, no CVA tenderness and no tenderness at McBurney's point.  Musculoskeletal: Normal range of motion.  Neurological: She is alert and oriented to person, place, and time. Coordination normal.    Skin: Skin is warm and dry. No rash noted. No erythema. No pallor.    ED Course  Procedures  MDM Spoke with Dr. Ezzard Standing. He has reviewed the CT scan and labs. Feels patient should have close follow-up in the office in 1-2 weeks. Discussed this with pt. Who agrees with plan. Will d/c with antiemetics. Patient voices understanding and states she will return for worsening symptoms. Does not want analgesics      Thomasene Lot, [place] 07/24/11 1612

## 2011-07-24 NOTE — Telephone Encounter (Signed)
Ms. Neil Crouch [sp] called me about [place].  I did her RYGB 11/24/2010.  She has been having vague abdominal pain for one month.  The pain got worse and is in the lower abdomen and she went [facility].  I've only seen Marquitta once since surgery 12/10/2010. :(  Her CT showed cirrhosis (known) and a cyst of her left ovary (known).  In fact she is supposed to have a bx of her ovary in the next week.  Her labs normal.  I told Ms. Winn to tell the pt. To see [place] in the next 1 - 2 weeks.  And if she is having surgery, have her Gyn doctor talk to me.

## 2011-07-24 NOTE — ED Notes (Signed)
Pt in with c/o R sided abd pain with N and V after each meal. Pt reports gastric bypass in Nov 2011 and was seen by work Char[employer] fundraiser who fears abd blockage. Reports vomiting solid chunks of food. Reports watery diarrhea in past but now reports ribbon like stools. Pain is constant.

## 2011-07-24 NOTE — ED Notes (Signed)
Nausea reported s/p taking po contrast. PA-C notified, orders received and carried.

## 2011-07-24 NOTE — ED Provider Notes (Signed)
  I performed a history and physical examination of [name] and discussed her management with Thomasene Lot, PA.  I agree with the history, physical, assessment, and plan of care, with the following exceptions: None  I was present for the following procedures: None Time Spent in Critical Care of the patient: None  Tildon Husky, MD 07/24/11 1614

## 2011-08-03 ENCOUNTER — Ambulatory Visit (INDEPENDENT_AMBULATORY_CARE_PROVIDER_SITE_OTHER): Payer: 59 | Admitting: Surgery

## 2011-08-03 ENCOUNTER — Encounter (INDEPENDENT_AMBULATORY_CARE_PROVIDER_SITE_OTHER): Payer: Self-pay | Admitting: Surgery

## 2011-08-03 DIAGNOSIS — R109 Unspecified abdominal pain: Secondary | ICD-10-CM

## 2011-08-03 DIAGNOSIS — Z9884 Bariatric surgery status: Secondary | ICD-10-CM

## 2011-08-03 NOTE — Patient Instructions (Signed)
Will schedule upper endoscopy.

## 2011-08-03 NOTE — Progress Notes (Addendum)
Chief Complaint  Patient presents with  . Other    est bariatric pt- f/u from er    [name] is a 40 y.o. white female who is a patient of Neena Rhymes, MD, MD and comes to me today for abdominal pain and followup of RYGB.  The patient had a laparoscopic Roux-en-Y gastric bypass done the only 28th of November 2011. Unfortunately she only had one followup visit on 11 December 2011.  She has done well since I last saw her until about 3 weeks ago.  Because of her insurance, she is not seeing Dr. Avis Epley as her primary medical doctor, she's not seeing Dr. Lavada Mesi who follows her cirrhosis from [facility],  and she's not seeing Dr. Bjorn Loser he moved out of state.  Her new primary care physician is Dr. Marzetta Board with [facility].  For about 3 weeks she has had vague abdominal pain. She had nausea and occasional vomiting. She can she lost an additional 10 pounds of weight. Her pain has been localized to the right lower quadrant and seems unrelated to eating food.  Because of her abdominal pain she was found to have hematuria. She is seeing Dr. Sabino Gasser of [facility] to do a cystoscopy on her within the last week. She is to see him later this week for her results. He did not think her bladder was the source of her pain.  She denies smoking. She denies NSIADs. She was prescribed Aleve for pain, but she never took the medicine. She is not on steroids.  She was seen in improvement in her diabetes. She's only taking metformin. She thinks her hemoglobin A1c is about 8.  She was seeing Dr. Leslie Dales, but Dr. Beverely Low will follow her DM.  Her cirrhosis is stable and she plans to have Dr. Neena Rhymes follow her cirrhosis.  Past Medical History  Diagnosis Date  . Hyperlipidemia   . Diabetes mellitus   . Kidney stones   . Recurrent UTI   . Non-alcoholic fatty liver disease   . Weight decrease   . Chills   . Fatigue     loss of sleep  . Dizziness   .  Generalized headaches   . Wears glasses   . Blood in urine   . Liver disease   . Poor appetite   . Nausea & vomiting   . Abdominal pain   . Blurred vision     when feeling very fatigued     Past Surgical History  Procedure Date  . Cholecystectomy 2005  . Partial hysterectomy 2008  . Kidney stones   . Gastric bypass 10/2010  . Tubal ligation     x3  . Abdominal hysterectomy   . Gastric bypass   . Cystostomy w/ bladder biopsy     Current Outpatient Prescriptions  Medication Sig Dispense Refill  . CHLOROTHIAZIDE PO Take 25 mg by mouth daily.        . metFORMIN (GLUCOPHAGE) 500 MG tablet Take 1 tablet (500 mg total) by mouth 2 (two) times daily with a meal.  60 tablet  3  . ondansetron (ZOFRAN) 4 MG tablet Take 4 mg by mouth every 8 (eight) hours as needed.        . traMADol (ULTRAM) 50 MG tablet Take 1 tablet (50 mg total) by mouth every 6 (six) hours as needed for pain.  45 tablet  0  . naproxen sodium (ALEVE) 220 MG tablet Take 2 tablets (440 mg total) by  mouth 2 (two) times daily with a meal.  30 tablet  prn  . pioglitazone (ACTOS) 45 MG tablet Take 45 mg by mouth daily.        . potassium chloride (KLOR-CON) 20 MEQ packet Take 20 mEq by mouth 2 (two) times daily.        . rosuvastatin (CRESTOR) 20 MG tablet Take 1 tablet (20 mg total) by mouth daily.  30 tablet  3    No Known Allergies  SOCIAL and FAMILY HISTORY: Her daughter just got married (first of four children). 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  PHYSICAL EXAM: BP 112/82  Pulse 62  Temp 97.7 F (36.5 C)  Ht 5\' 4"  (1.626 m)  Wt 196 lb (88.905 kg)  BMI 33.64 kg/m2  HEENT: Normal. Pupils equal. Normal dentition. Neck: Supple. No thyroid mass. Lymph Nodes:  No supraclavicular or cervical nodes. Lungs: Clear and symmetric. Heart:  RRR. No murmur. Abdomen: No mass. No tenderness. No hernia. Normal bowel sounds.  Incisions from RYGB are normal.  There is no hernia or mass. Rectal: Not  done. Extremities:  Good strength in upper and lower extremities. Neurologic:  Grossly intact to motor and sensory function.    DATA REVIEWED: Labs and CT scan (copy to pt.) CT Impression (07/24/11): 1.  Cirrhotic changes involving the liver and associated mild splenomegaly.  No significant portal venous collaterals but there are small esophageal varices. 2.  Surgical changes from gastric bypass surgery. 3.  Mild and persistent slightly distended small bowel loops in the mid abdomen without obvious obstruction.  ASSESSMENT and PLAN: 1.  S/P RYGB  Successful weight loss of about 60 pounds.  2.  Abdominal pain and nausea.  Etiology unclear.  I discussed with patient about potential llong term complications of RYGB (ulcer, stenosis).  Her gall bladder is already removed.  Will plan upper endo.  I discussed indications and risk of procedure.  Risks include perforation and bleeding.  I also talked about the possible need to laparoscope her if all else is normal.   [Endo on 08/11/11 negative, except mild esophagitis.  CLO positive. To treat. If patient has persistent pain post treatment, will plan laparoscopy. Discussed by phone. DN 08/21/11]   [Called back and spoke to [place].  No better.  Will schedule for laparoscopic exploration.  Will probably also re-endoscope.  08/28/11]  3.  Hematuria.  Seeing Dr. [facility] of [facility]. 4.  Diabetes Mellitus.  On Metformin only. 5.  Cirrhosis.  Stable. 6.  Thrombocytopenia. 7.  History of kidney stones. 8.  Unexplained neurologic event - November 2011. [9.  Saw Dr. Beverely Low 08/19/11 for severe depression.  I don't know how this is playing in her GI symptoms.  DN 08/21/11]

## 2011-08-10 ENCOUNTER — Ambulatory Visit (HOSPITAL_COMMUNITY)
Admission: RE | Admit: 2011-08-10 | Discharge: 2011-08-10 | Disposition: A | Discharge status: Home / Self Care | Payer: Managed Care, Other (non HMO) | Source: Ambulatory Visit | Attending: Surgery | Admitting: Surgery

## 2011-08-10 DIAGNOSIS — R109 Unspecified abdominal pain: Secondary | ICD-10-CM

## 2011-08-10 DIAGNOSIS — Z9884 Bariatric surgery status: Secondary | ICD-10-CM | POA: Insufficient documentation

## 2011-08-10 DIAGNOSIS — E119 Type 2 diabetes mellitus without complications: Secondary | ICD-10-CM | POA: Insufficient documentation

## 2011-08-10 DIAGNOSIS — E785 Hyperlipidemia, unspecified: Secondary | ICD-10-CM | POA: Insufficient documentation

## 2011-08-10 DIAGNOSIS — K209 Esophagitis, unspecified without bleeding: Secondary | ICD-10-CM | POA: Insufficient documentation

## 2011-08-10 DIAGNOSIS — K219 Gastro-esophageal reflux disease without esophagitis: Secondary | ICD-10-CM | POA: Insufficient documentation

## 2011-08-10 DIAGNOSIS — Z79899 Other long term (current) drug therapy: Secondary | ICD-10-CM | POA: Insufficient documentation

## 2011-08-10 LAB — GLUCOSE, CAPILLARY: Glucose-Capillary: 116 mg/dL — ABNORMAL HIGH (ref 70–99)

## 2011-08-11 NOTE — Op Note (Signed)
[name], [name] NO.:  [id number]  MEDICAL RECORD NO.:  [id number]  LOCATION:  [facility]                         FACILITY:  [facility]  PHYSICIAN:  Sandria Bales. Ezzard Standing, M.D.  DATE OF BIRTH:  1971/08/16  DATE OF PROCEDURE:  08/10/2011                               OPERATIVE REPORT  PREOPERATIVE DIAGNOSIS:  Abdominal pain status post Roux-en-Y gastric bypass.  POSTOPERATIVE DIAGNOSES:  Abdominal pain status post Roux-en-Y gastric bypass with normal gastric pouch.  Mild gastroesophageal reflux disease with mild distal esophagitis.  PROCEDURE:  Esophagogastrojejunoscopy.  SURGEON:  Sandria Bales. Ezzard Standing, M.D.  ANESTHESIA:  Fentanyl 50 mcg, 5 mg of Versed.  COMPLICATIONS:  None.  INDICATIONS FOR PROCEDURE:  Ms. Bohan is a 41 year old white female who sees Dr. Neena Rhymes, her medical doctor.  I did a laparoscopic Roux-en-Y gastric bypass on her on November 16, 2010.  She did well until about a month ago when she started developing vague abdominal pain.  She is also seeing Dr. Sabino Gasser, a urologist, in [facility] for hematuria.  He thinks her abdominal pain is unrelated to any cause for hematuria.   She had a recent cystoscopy which was negative.  She has undergone a CT scan that was done on July 24, 2011.  This shows some changes of her liver, consistent with cirrhosis and splenomegaly, but no other abnormality.  She has a copy of her CT scan report.  She now comes for an upper endoscopy to further evaluate her gastric pouch.  I discussed with her the indications and potential complications of endoscopy, the primary complications being perforation or bleeding.  PROCEDURE NOTE:  The back of the patient's throat was anesthetized  with Cetacaine.  She was monitored with pulse oximetry, EKG, and blood pressure cuff and had 2 L of nasal O2 flowing during the procedure.    A flexible Pentax endoscope was passed down the back of her throat.  I advanced this into her  stomach pouch without difficulty.  I went down both the efferent and afferent limbs of her jejunum.  Her gastrojejunal anastomosis was at 42-43 cm.  Her gastric pouch was of normal size.  Her esophagogastric junction or Z-line was at about 37 cm for 5-6 cm pouch.  There was no ulcer, no gastritis, no erosion in the pouch with anastomosis.  She did have some very minimal irritation of her distal esophagus.  The remainder of her esophagus was unremarkable.  I did do a CLO biopsy of her gastric wall for H pylori.  I withdrew the scope from the esophagus which was normal.  She tolerated the procedure well.    The cause of abdominal pain is unclear.  I talked to her about starting on a proton pump inhibitor to see if this changes her symptoms at all.  If she has persistent symptoms over another 4-6 weeks, consider laparoscopy. I gave her copy of her pictures and a prescription for Protonix 40 mg twice a day for a month.   Sandria Bales. Ezzard Standing, M.D., FACS    DHN/MEDQ  D:  08/10/2011  T:  08/11/2011  Job:  [id number]  cc:   Neena Rhymes, M.D.  Electronically Signed  by Ovidio Kin M.D. on 08/11/2011 09:49:45 AM

## 2011-08-18 ENCOUNTER — Telehealth (INDEPENDENT_AMBULATORY_CARE_PROVIDER_SITE_OTHER): Payer: Self-pay

## 2011-08-18 NOTE — Telephone Encounter (Signed)
Pt called this am and stated her pain is worse.  She had a negative endo last week.  She vomits once per day.  She wants to know if she should proceed with the laparoscopy?  I paged Dr Ezzard Standing and he looked up her CLO test result which was positive.  He advised we need to treat that with the Prev Pac and then tentatively post her for Monday for the surgery.  I will check on her Friday and see if she is better.  If so, we can cancel surgery.  I called in the prev pac po bid x14days to [facility] [place] [phone]

## 2011-08-19 ENCOUNTER — Ambulatory Visit (INDEPENDENT_AMBULATORY_CARE_PROVIDER_SITE_OTHER): Payer: Managed Care, Other (non HMO) | Admitting: Family Medicine

## 2011-08-19 ENCOUNTER — Encounter: Payer: Self-pay | Admitting: Family Medicine

## 2011-08-19 VITALS — BP 122/70 | HR 75 | Temp 98.9°F | Wt 198.4 lb

## 2011-08-19 DIAGNOSIS — F329 Major depressive disorder, single episode, unspecified: Secondary | ICD-10-CM | POA: Insufficient documentation

## 2011-08-19 DIAGNOSIS — F32A Depression, unspecified: Secondary | ICD-10-CM

## 2011-08-19 DIAGNOSIS — F3289 Other specified depressive episodes: Secondary | ICD-10-CM

## 2011-08-19 HISTORY — DX: Depression, unspecified: F32.A

## 2011-08-19 MED ORDER — CITALOPRAM HYDROBROMIDE 20 MG PO TABS: 20.0000 mg | ORAL_TABLET | Freq: Every day | ORAL | Status: DC

## 2011-08-19 MED ORDER — ALPRAZOLAM 0.5 MG PO TABS: 0.5000 mg | ORAL_TABLET | Freq: Three times a day (TID) | ORAL | Status: DC | PRN

## 2011-08-19 NOTE — Patient Instructions (Signed)
Follow up in 3-4 weeks to recheck mood- sooner if needed Start the citalopram daily for depression and anxiety Use the alprazolam as needed for those panicky moments Call and set up counseling- this is SUPER important Call with any questions or concerns Hang in there!

## 2011-08-19 NOTE — Progress Notes (Signed)
  Subjective:    Patient ID: [name], female    DOB: 1971-09-05, 40 y.o.   MRN: [id number]  HPI Anxiety- pt reports 'it started out of the blue'.  Will have 'a serious freak out about something stupid- like i need to go to the hospital'.  sxs started a couple months ago.  No obvious precipitating factor.  Is somewhat better than it was.  Reports increased sadness and 'i can't stand to be alone- that's when it happens'.  Daughter is married and has moved out.  Still has 3 children at home- this was 1st child to move out.  Will cry on the car ride home, cry at night when lying in bed.  Has attempted to talk to husband about this- he's supportive.  Hasn't dealt w/ deaths of parents and brother's suicide.  'i feel guilty about everything'.  Denies SI/HI- but is worried about getting to that point b/c her brother successfully committed suicide and her sister has had multiple attempts.   Review of Systems For ROS see HPI     Objective:   Physical Exam  Vitals reviewed. Constitutional: She appears well-developed and well-nourished. No distress.  Psychiatric: Her behavior is normal. Judgment and thought content normal.       Tearful, obvious upset.  Depressed, anxious.          Assessment & Plan:

## 2011-08-19 NOTE — Assessment & Plan Note (Signed)
Pt's sxs are severe- will start controller med and rescue med.  Strongly encouraged her to start counseling- names and #s given.  Total time spent w/ pt, 33 minutes- >50% spent counseling.  Will follow closely.

## 2011-08-21 ENCOUNTER — Other Ambulatory Visit (HOSPITAL_COMMUNITY): Payer: Managed Care, Other (non HMO)

## 2011-08-24 ENCOUNTER — Ambulatory Visit (HOSPITAL_COMMUNITY): Admission: RE | Admit: 2011-08-24 | Payer: Managed Care, Other (non HMO) | Source: Ambulatory Visit | Admitting: Surgery

## 2011-08-28 ENCOUNTER — Telehealth (INDEPENDENT_AMBULATORY_CARE_PROVIDER_SITE_OTHER): Payer: Self-pay

## 2011-08-28 NOTE — Telephone Encounter (Signed)
Phone call today from pt stating that she is still having the right sided pain.  She is vomiting daily and nauseated.  She has almost finished the prev pac.  She wants to know what to do.  I spoke to Dr Ezzard Standing who recommends proceeding with surgery.  I notified the pt we will set her up for the laparoscopy.  She agrees.  The orders were sent to scheduling.

## 2011-09-01 ENCOUNTER — Ambulatory Visit (HOSPITAL_COMMUNITY)
Admission: RE | Admit: 2011-09-01 | Discharge: 2011-09-02 | Disposition: A | Discharge status: Home / Self Care | Payer: Managed Care, Other (non HMO) | Source: Ambulatory Visit | Attending: Surgery | Admitting: Surgery

## 2011-09-01 ENCOUNTER — Encounter: Payer: Managed Care, Other (non HMO) | Admitting: Family Medicine

## 2011-09-01 DIAGNOSIS — N83209 Unspecified ovarian cyst, unspecified side: Secondary | ICD-10-CM | POA: Insufficient documentation

## 2011-09-01 DIAGNOSIS — R1031 Right lower quadrant pain: Secondary | ICD-10-CM | POA: Insufficient documentation

## 2011-09-01 DIAGNOSIS — K660 Peritoneal adhesions (postprocedural) (postinfection): Secondary | ICD-10-CM

## 2011-09-01 DIAGNOSIS — K7460 Unspecified cirrhosis of liver: Secondary | ICD-10-CM | POA: Insufficient documentation

## 2011-09-01 DIAGNOSIS — Z9884 Bariatric surgery status: Secondary | ICD-10-CM | POA: Insufficient documentation

## 2011-09-01 DIAGNOSIS — K5900 Constipation, unspecified: Secondary | ICD-10-CM | POA: Insufficient documentation

## 2011-09-01 DIAGNOSIS — D696 Thrombocytopenia, unspecified: Secondary | ICD-10-CM | POA: Insufficient documentation

## 2011-09-01 DIAGNOSIS — N736 Female pelvic peritoneal adhesions (postinfective): Secondary | ICD-10-CM | POA: Insufficient documentation

## 2011-09-01 DIAGNOSIS — R112 Nausea with vomiting, unspecified: Secondary | ICD-10-CM | POA: Insufficient documentation

## 2011-09-01 DIAGNOSIS — Z87442 Personal history of urinary calculi: Secondary | ICD-10-CM | POA: Insufficient documentation

## 2011-09-01 DIAGNOSIS — E119 Type 2 diabetes mellitus without complications: Secondary | ICD-10-CM | POA: Insufficient documentation

## 2011-09-01 HISTORY — PX: EXPLORATORY LAPAROTOMY: SUR591

## 2011-09-01 LAB — COMPREHENSIVE METABOLIC PANEL
ALT: 15 U/L (ref 0–35)
Alkaline Phosphatase: 67 U/L (ref 39–117)
BUN: 13 mg/dL (ref 6–23)
CO2: 27 mEq/L (ref 19–32)
Chloride: 100 mEq/L (ref 96–112)
GFR calc Af Amer: >60 mL/min (ref >60)
Glucose, Bld: 118 mg/dL — ABNORMAL HIGH (ref 70–99)
Potassium: 3.5 mEq/L (ref 3.5–5.1)
Sodium: 137 mEq/L (ref 135–145)
Total Bilirubin: 0.9 mg/dL (ref 0.3–1.2)
Total Protein: 7.1 g/dL (ref 6.0–8.3)

## 2011-09-01 LAB — DIFFERENTIAL
Basophils Relative: 1 % (ref 0–1)
Eosinophils Relative: 3 % (ref 0–5)
Monocytes Relative: 6 % (ref 3–12)
Neutrophils Relative %: 41 % — ABNORMAL LOW (ref 43–77)

## 2011-09-01 LAB — GLUCOSE, CAPILLARY: Glucose-Capillary: 180 mg/dL — ABNORMAL HIGH (ref 70–99)

## 2011-09-01 LAB — SURGICAL PCR SCREEN
MRSA, PCR: NEGATIVE
Staphylococcus aureus: NEGATIVE

## 2011-09-01 LAB — CBC
HCT: 40.3 % (ref 36.0–46.0)
Hemoglobin: 13.8 g/dL (ref 12.0–15.0)
RBC: 4.69 MIL/uL (ref 3.87–5.11)
WBC: 5.3 10*3/uL (ref 4.0–10.5)

## 2011-09-02 LAB — GLUCOSE, CAPILLARY: Glucose-Capillary: 201 mg/dL — ABNORMAL HIGH (ref 70–99)

## 2011-09-04 NOTE — Discharge Summary (Signed)
[name], [name] NO.:  [id number]  MEDICAL RECORD NO.:  [id number]  LOCATION:  1526                         FACILITY:  [facility]  PHYSICIAN:  Sandria Bales. Ezzard Standing, M.D.  DATE OF BIRTH:  [birth year]  DATE OF ADMISSION:  09/01/2011 DATE OF DISCHARGE:  09/02/2011                              DISCHARGE SUMMARY   DISCHARGE DIAGNOSES: 1. Lower abdominal/pelvic adhesions. 2. Right ovarian cyst. 3. Cirrhosis of the liver. 4. Normal Roux-en-Y gastric bypass. 5. Diabetes mellitus, on oral hypoglycemics. 6. Thrombocytopenia. 7. History of kidney stones. 8. History of depression.  OPERATIONS PERFORMED:  The patient underwent a diagnostic laparoscopy with enterolysis of adhesions, an upper endoscopy by Dr. Ovidio Kin on September 01, 2011.  HISTORY OF ILLNESS:  [name] is a 40 year old white female who sees Dr. Neena Rhymes as her primary medical doctor.  I did a laparoscopic Roux-en-Y gastric bypass on her on November 16, 2010 for morbid obesity. She has lost about 60 pounds of weight.  Towards the end of July 2012, she developed vague abdominal pain, had nausea, occasional vomiting, and we have evaluated this.  A CT scan showed no obvious intraabdominal abnormality.  I did upper endoscopy on her on August 11, 2011 which was negative except for some mild esophagitis.  Her CLO test was positive, so we have treated her for H. pylori, but she had no improvement of her symptoms.  She now comes for this hospitalization for diagnostic laparoscopy.  PAST MEDICAL HISTORY:  Significant comorbidities include: 1. She is a diabetic though this is improved with the weight loss.     She is on metformin only. 2. She has cirrhosis, which is stable.  She was seeing Dr. Brooke Dare at [facility], but because of insurance changes, I do     not think she is seeing him anymore. 3. She has had thrombocytopenia, probably secondary to the cirrhosis     of the liver. 4.  She has had a history of kidney stones. 5. She has had hematuria, seen by Dr. Sabino Gasser at [facility] before,     this has been benign so far . 6. She has had a history of depression.  HOSPITAL COURSE:  On the day of admission, she was taken to the operating room where she underwent a diagnostic laparoscopy.  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  Her Roux-en-Y gastric bypass was unremarkable with no evidence of internal hernias.  She did have adhesions to her lower abdomen; one was where she was causing pain and she had adhesions around a right ovarian cyst.  Photos of this were taken.  Postoperatively, she has done well.  She is tolerating liquids.  She is having minimal discomfort.  Her incisions look good and she is ready for discharge.  DISCHARGE INSTRUCTIONS:  Will include: Resuming her home medications.  This includes: 1. Metformin 1000 mg twice a day. 2. Crestor 40 mg daily. 3. Citalopram 20 mg daily. 4. Chlorthalidone 25 mg daily. 5. Allopurinol 300 mg daily.  I have given her a prescription for  Ultram for pain that is 50 mg once or twice every 8 hours p.r.n.     pain.  She can return to work on Monday, September 10.  She should do no driving for a day or 2, can start showering tomorrow.  Her diet will be a post-gastric bypass diet.  She is to see me back in 3-4 weeks and call for an appointment.  Her discharge condition is good.   Sandria Bales. Ezzard Standing, M.D., FACS   DHN/MEDQ  D:  09/02/2011  T:  09/02/2011  Job:  [id number]  cc:   Neena Rhymes, M.D.  Electronically Signed by Ovidio Kin M.D. on 09/04/2011 11:15:40 AM

## 2011-09-04 NOTE — Op Note (Signed)
NAMESIMONA, ROCQUE NO.:  [id number]  MEDICAL RECORD NO.:  [id number]  LOCATION:  1526                         FACILITY:  [facility]  PHYSICIAN:  Sandria Bales. Ezzard Standing, M.D.  DATE OF BIRTH:  08-May-1971  DATE OF PROCEDURE:  09/01/2011                              OPERATIVE REPORT   PREOPERATIVE DIAGNOSIS:  Right lower quadrant abdominal pain of uncertain etiology, status post Roux-en-Y gastric bypass.  POSTOPERATIVE DIAGNOSES:  Adhesions to right lower quadrant, right ovarian cyst, cirrhosis of liver, and normal Roux-en-Y gastric bypass.  PROCEDURES:  Diagnostic laparoscopy with lysis of adhesions, and upper endoscopy.  SURGEON:  Sandria Bales. Ezzard Standing, M.D.  FIRST ASSISTANT:  Thornton Park. Daphine Deutscher, MD  ANESTHESIA:  General endotracheal with 30 cc of 0.25% Marcaine.  COMPLICATIONS:  None.  INDICATIONS FOR PROCEDURE:  Ms. Kewley is a 40 year old white female, on whom I did a Roux-en-Y gastric bypass on November 24, 2010.  She hasdone well since her bypass with over 60 pounds of weight loss.  However, late July, approximately 5-6 weeks ago started developing abdominal pain, nausea, and vomiting.  She has been somewhat constipated since the surgery and this will not change her bowel habits.  On August 11, 2011, I did an upper endoscopy, which was negative.  Her CLO test was positive, so we treated her for this for the H. Pylori, which I saw no improvement in her symptoms, so now she comes for exploration to make sure there are no problems with her pouch or anastomosis.  The indications and potential complications of surgery explained to the patient.  Potential complications included, but not limited to bleeding, infection, bowel injury, and that we may not be able to resolve her symptoms.  OPERATIVE NOTE:  Patient was placed in a supine position, brought to the operating room.  She had a Foley catheter in place.  Dr. Eilene Ghazi supervised the anesthesia in room #1.  Her  abdomen was prepped with ChloraPrep, a time-out was held and surgical checklist run.  I accessed her abdominal cavity with a 5 mm Hasson trocar in the infraumbilical incision.  I secured this with the balloon system that came with the Hasson.  I placed a 5 mm trocar in the right lower quadrant, 5 mm trocar in the left lower quadrant, and carried out abdominal exploration.  Both her right and left lobe of liver had evidence of cirrhosis.  This appeared unchanged from what I saw in November 2011.  I did not do a biopsy.  I then found her gastrojejunal anastomosis and pouch, which looked good.  There was no inflammation around the small bowel from the ligament of Treitz to her jejunojejunal anastomosis. There was no evidence of Peterson defect.  There was no evidence of defect at the jejunojejunostomy and I saw no evidence of internal hernia.  She had no dilated bowel.  I then looked at the biliary limb, which was unremarkable.  I then traced out the small bowel common channel to the terminal ileum and the antimesenteric fold of Treves.  The right colon, transverse colon, transverse colon, and left colon were unremarkable.  In her pelvis, she had adhesions.  She had a  right ovarian cyst, probably 3 to 4 cm in size.  This appeared benign.  I did lyse some adhesions down from the anterior abdominal wall, that is where she was pointing to her pain.  I did take photos of the ovarian cyst and of her liver that I will give to her.  At the end of the laparoscopic exploration, Dr. Daphine Deutscher broke scrub and did and upper endoscopy.  He visualized the esophagus, gastric pouch, and the jejunum and saw no abnormality.  I clamped off the jejunum so he could inflate the proximal GI tract.  So, at best, I found some adhesions in her right lower quadrant pelvis, which may explain her pain.  At worse, there was  certainly nothing wrong with a Roux-en-Y gastric bypass or any other significant lesion or mass to  explain her pain.  I then removed the trocars and turned the umbilical port, it was closed with 0 Vicryl suture.  The skin of each port was closed with 4-0 Monocryl suture, painted with Dermabond, and sterilely dressed.  The patient tolerated the procedure well, was transported to recovery room in good condition.  Because we scheduled surgery so late in the day, we are planning to keep her overnight.   Sandria Bales. Ezzard Standing, M.D., FACS   DHN/MEDQ  D:  09/01/2011  T:  09/01/2011  Job:  [id number]  cc:   Neena Rhymes, M.D.  Brooke Dare, MD Fax: [phone]  Electronically Signed by Ovidio Kin M.D. on 09/04/2011 11:14:58 AM

## 2011-09-16 ENCOUNTER — Ambulatory Visit: Payer: Managed Care, Other (non HMO) | Admitting: Family Medicine

## 2011-09-29 ENCOUNTER — Other Ambulatory Visit: Payer: Self-pay | Admitting: Family Medicine

## 2011-09-29 ENCOUNTER — Encounter (INDEPENDENT_AMBULATORY_CARE_PROVIDER_SITE_OTHER): Payer: Self-pay

## 2011-09-29 NOTE — Telephone Encounter (Signed)
Last OV and last filled 08/19/11

## 2011-09-30 ENCOUNTER — Encounter (INDEPENDENT_AMBULATORY_CARE_PROVIDER_SITE_OTHER): Payer: Self-pay | Admitting: Surgery

## 2011-09-30 ENCOUNTER — Ambulatory Visit (INDEPENDENT_AMBULATORY_CARE_PROVIDER_SITE_OTHER): Payer: Managed Care, Other (non HMO) | Admitting: Surgery

## 2011-09-30 VITALS — BP 104/74 | HR 76 | Temp 97.4°F | Resp 12 | Ht 64.0 in | Wt 187.4 lb

## 2011-09-30 DIAGNOSIS — Z9884 Bariatric surgery status: Secondary | ICD-10-CM

## 2011-09-30 NOTE — Progress Notes (Signed)
ASSESSMENT AND PLAN: 1. S/P RYGB   Successful weight loss of about 70 pounds.   Surgery date - 11/24/2010  Initial weight - 256, BMI 45.4.  I will see her back in one year for annual follow-up of her RYGB.  I told her that I like to see patients at least once a year after surgery.  2. Abdominal pain and nausea.   Much better after diagnostic laparoscopy.  I did find some adhesions to lyse and these may have been the source of pain. She saw Dr. Tenny Craw for the right ovarian cyst I found.   Endo on 08/11/11 negative, except mild esophagitis. CLO positive. To treat.   Diagnostic laparoscopy/enterolysis of adhesions - 09/01/2011  3. Hematuria. Seeing Dr. Sabino Gasser of [facility].  4. Diabetes Mellitus. On Metformin only.  5. Cirrhosis. Stable. To be followed by Dr. Beverely Low. 6. Thrombocytopenia.  7. History of kidney stones.  8. Unexplained neurologic event - November 2011.  9. Saw Dr. Beverely Low 08/19/11 for severe depression. I don't know how this is playing in her GI symptoms. 10.  Ovarian cyst, right.  She has seen Dr. Tenny Craw at [facility].  They will follow this.  HISTORY OF PRESENT ILLNESS: Chief Complaint  Patient presents with  . Follow-up    PO exp lap abd pain    [name] is a 40 y.o. (DOB: 25-Mar-1971)  white female who is a patient of Neena Rhymes, MD, MD and comes to me today for RYGB and abdominal pain.  She has been through a work up which included an upper endo and a diagnostic laparoscopy.  This is a post op visit for my diagnostic laparoscopy.  She's doing much better.  Her weight is down to 187.  She is having no pain, no nausea, no change in bowels.  She has a little irritation at the umbilical incision.    PHYSICAL EXAM: BP 104/74  Pulse 76  Temp(Src) 97.4 F (36.3 C) (Temporal)  Resp 12  Ht 5\' 4"  (1.626 m)  Wt 187 lb 6.4 oz (85.004 kg)  BMI 32.17 kg/m2  General: WN WF.  In no distress. HEENT: Normal. Pupils equal. Normal dentition. Neck: Supple. No thyroid  mass. Lungs: Clear and symmetric. Heart:  RRR. No murmur. Abdomen: No mass. No tenderness. No hernia. Normal bowel sounds.  Abdominal incisions look okay.  She may have a knot at her umbilical incision which should disappear.  Extremities:  Good strength in upper and lower extremities. Neurologic:  Grossly intact to motor and sensory function.  DATA REVIEWED: None new.

## 2011-10-13 ENCOUNTER — Emergency Department (HOSPITAL_BASED_OUTPATIENT_CLINIC_OR_DEPARTMENT_OTHER)
Admission: EM | Admit: 2011-10-13 | Discharge: 2011-10-13 | Disposition: A | Discharge status: Home / Self Care | Payer: Managed Care, Other (non HMO) | Attending: Emergency Medicine | Admitting: Emergency Medicine

## 2011-10-13 ENCOUNTER — Encounter (HOSPITAL_BASED_OUTPATIENT_CLINIC_OR_DEPARTMENT_OTHER): Payer: Self-pay | Admitting: *Deleted

## 2011-10-13 DIAGNOSIS — R112 Nausea with vomiting, unspecified: Secondary | ICD-10-CM | POA: Insufficient documentation

## 2011-10-13 DIAGNOSIS — N83209 Unspecified ovarian cyst, unspecified side: Secondary | ICD-10-CM | POA: Insufficient documentation

## 2011-10-13 DIAGNOSIS — K7689 Other specified diseases of liver: Secondary | ICD-10-CM | POA: Insufficient documentation

## 2011-10-13 DIAGNOSIS — Z79899 Other long term (current) drug therapy: Secondary | ICD-10-CM | POA: Insufficient documentation

## 2011-10-13 DIAGNOSIS — E785 Hyperlipidemia, unspecified: Secondary | ICD-10-CM | POA: Insufficient documentation

## 2011-10-13 DIAGNOSIS — E119 Type 2 diabetes mellitus without complications: Secondary | ICD-10-CM | POA: Insufficient documentation

## 2011-10-13 LAB — CBC
Hemoglobin: 13.3 g/dL (ref 12.0–15.0)
MCH: 29.6 pg (ref 26.0–34.0)
MCV: 85.1 fL (ref 78.0–100.0)
Platelets: 103 10*3/uL — ABNORMAL LOW (ref 150–400)
RBC: 4.5 MIL/uL (ref 3.87–5.11)
WBC: 6.4 10*3/uL (ref 4.0–10.5)

## 2011-10-13 LAB — BASIC METABOLIC PANEL
BUN: 11 mg/dL (ref 6–23)
CO2: 32 mEq/L (ref 19–32)
Chloride: 101 mEq/L (ref 96–112)
Creatinine, Ser: 0.9 mg/dL (ref 0.50–1.10)
GFR calc Af Amer: >90 mL/min (ref >90)
Glucose, Bld: 114 mg/dL — ABNORMAL HIGH (ref 70–99)
Potassium: 3.5 mEq/L (ref 3.5–5.1)

## 2011-10-13 LAB — URINALYSIS, ROUTINE W REFLEX MICROSCOPIC
Bilirubin Urine: NEGATIVE
Glucose, UA: NEGATIVE mg/dL
Hgb urine dipstick: NEGATIVE
Ketones, ur: NEGATIVE mg/dL
Nitrite: NEGATIVE
Specific Gravity, Urine: 1.021 (ref 1.005–1.030)
pH: 5.5 (ref 5.0–8.0)

## 2011-10-13 MED ORDER — ONDANSETRON HCL 4 MG/2ML IJ SOLN
4.0000 mg | Freq: Once | INTRAMUSCULAR | Status: AC
  Administered 2011-10-13: 4 mg via INTRAVENOUS
  Filled 2011-10-13: qty 2

## 2011-10-13 MED ORDER — SODIUM CHLORIDE 0.9 % IV BOLUS (SEPSIS)
1000.0000 mL | Freq: Once | INTRAVENOUS | Status: AC
  Administered 2011-10-13: 1000 mL via INTRAVENOUS

## 2011-10-13 MED ORDER — ONDANSETRON 4 MG PO TBDP: 4.0000 mg | ORAL_TABLET | Freq: Three times a day (TID) | ORAL | Status: AC | PRN

## 2011-10-13 MED ORDER — MORPHINE SULFATE 4 MG/ML IJ SOLN
4.0000 mg | Freq: Once | INTRAMUSCULAR | Status: AC
  Administered 2011-10-13: 4 mg via INTRAVENOUS
  Filled 2011-10-13: qty 1

## 2011-10-13 NOTE — ED Notes (Signed)
Pt amb to room 5 with slow, steady gait in nad. Pt reports right lower quad pain x 2 weeks, seen at [facility] and dx with ovarian cysts, states she is scheduled for surgery on 11/1 for oopherectomy. Pt cont with rlq pain, worse today. Pt states she has been vomiting unable to retain po x 2 hours pta.

## 2011-10-13 NOTE — ED Provider Notes (Signed)
History     CSN: [id number] Arrival date & time: 10/13/2011  2:17 PM   First MD Initiated Contact with Patient 10/13/11 1426      Chief Complaint  Patient presents with  . Ovarian Cyst    (Consider location/radiation/quality/duration/timing/severity/associated sxs/prior treatment) HPI Comments: Pt states that she has been diagnosed with a hemorrhagic cyst and she is supposed to have her ovaries out on 11/1:pt states that the pain seems to getting worse and she has been vomiting:pt states that she had an ultrasound with ob/gyn 1 week ago  Patient is a 40 y.o. female presenting with abdominal pain. The history is provided by the patient. No language interpreter was used.  Abdominal Pain The primary symptoms of the illness include abdominal pain. The current episode started more than 2 days ago. The onset of the illness was gradual. The problem has been gradually worsening.  The patient states that she believes she is currently not pregnant. The patient has not had a change in bowel habit. Symptoms associated with the illness do not include anorexia, diaphoresis or hematuria.    Past Medical History  Diagnosis Date  . Hyperlipidemia   . Diabetes mellitus   . Kidney stones   . Recurrent UTI   . Non-alcoholic fatty liver disease   . Weight decrease   . Chills   . Fatigue     loss of sleep  . Dizziness   . Generalized headaches   . Wears glasses   . Blood in urine   . Liver disease   . Poor appetite   . Nausea & vomiting   . Abdominal pain   . Blurred vision     when feeling very fatigued   . Ovarian cyst     Past Surgical History  Procedure Date  . Cholecystectomy 2005  . Partial hysterectomy 2008  . Kidney stones   . Gastric bypass 10/2010  . Tubal ligation     x3  . Abdominal hysterectomy   . Cystostomy w/ bladder biopsy   . Exploratory laparotomy 09/01/11    Family History  Problem Relation Age of Onset  . Lung cancer Mother   . Cancer Mother     lung  .  Hyperlipidemia Father   . Heart disease Father   . Stroke Father   . Hypertension Father   . Diabetes Father   . Hypertension Brother   . Other Brother     suicide    History  Substance Use Topics  . Smoking status: Never Smoker   . Smokeless tobacco: Not on file  . Alcohol Use: No    OB History    Grav Para Term Preterm Abortions TAB SAB Ect Mult Living                  Review of Systems  Constitutional: Negative for diaphoresis.  Gastrointestinal: Positive for abdominal pain. Negative for anorexia.  Genitourinary: Negative for hematuria.  All other systems reviewed and are negative.    Allergies  Review of patient's allergies indicates no known allergies.  Home Medications   Current Outpatient Rx  Name Route Sig Dispense Refill  . ALPRAZOLAM 0.5 MG PO TABS  TAKE ONE TABLET BY MOUTH THREE TIMES DAILY AS NEEDED FOR SLEEP AND ANXIETY 30 tablet 1  . CHLOROTHIAZIDE PO Oral Take 25 mg by mouth daily.      Marland Kitchen CITALOPRAM HYDROBROMIDE 20 MG PO TABS Oral Take 1 tablet (20 mg total) by mouth daily. 30 tablet 2  .  METFORMIN HCL 500 MG PO TABS Oral Take 1 tablet (500 mg total) by mouth 2 (two) times daily with a meal. 60 tablet 3  . ONDANSETRON HCL 4 MG PO TABS Oral Take 4 mg by mouth every 8 (eight) hours as needed.      Marland Kitchen POTASSIUM CHLORIDE 20 MEQ PO PACK Oral Take 20 mEq by mouth 2 (two) times daily.      Marland Kitchen ROSUVASTATIN CALCIUM 20 MG PO TABS Oral Take 1 tablet (20 mg total) by mouth daily. 30 tablet 3  . TRAMADOL HCL 50 MG PO TABS Oral Take 1 tablet (50 mg total) by mouth every 6 (six) hours as needed for pain. 45 tablet 0    BP 108/65  Pulse 89  Temp(Src) 98.3 F (36.8 C) (Oral)  Resp 20  SpO2 100%  Physical Exam  Constitutional: She is oriented to person, place, and time. She appears well-developed and well-nourished.  HENT:  Head: Normocephalic and atraumatic.  Eyes: Pupils are equal, round, and reactive to light.  Neck: Normal range of motion.  Pulmonary/Chest:  Effort normal and breath sounds normal.  Abdominal: Soft. Bowel sounds are normal. There is no tenderness.  Neurological: She is alert and oriented to person, place, and time.  Skin: Skin is warm and dry.  Psychiatric: She has a normal mood and affect.    ED Course  Procedures (including critical care time)  Labs Reviewed  BASIC METABOLIC PANEL - Abnormal; Notable for the following:    Glucose, Bld 114 (*)    GFR calc non Af Amer 80 (*)    All other components within normal limits  URINALYSIS, ROUTINE W REFLEX MICROSCOPIC - Abnormal; Notable for the following:    Appearance CLOUDY (*)    All other components within normal limits  CBC - Abnormal; Notable for the following:    Platelets 103 (*)    All other components within normal limits  PREGNANCY, URINE   No results found.   1. Ovarian cyst       MDM  Pt is feeling better and it tolerating YN:WGNF send pt home:pt has a non acute abdomen:pt can follow up with gyn tomorrow        Teressa Lower, NP 10/13/11 1721

## 2011-10-15 NOTE — ED Provider Notes (Signed)
History/physical exam/procedure(s) were performed by non-physician practitioner and as supervising physician I was immediately available for consultation/collaboration. I have reviewed all notes and am in agreement with care and plan.   Hilario Quarry, MD 10/15/11 1300

## 2011-10-16 NOTE — Patient Instructions (Addendum)
   Your procedure is scheduled on: Thursday November 1st  Enter through the [employer] of [facility] at: [employer] up the phone at the desk and dial [phone] and inform us of your arrival.  Please call this number if you have any problems the morning of surgery: [phone]  Remember: Do not eat food after midnight:Wednesday Do not drink clear liquids after:midnight Wednesday Take these medicines the morning of surgery with a SIP OF WATER: zofran, xanax if needed. Metformin____________  Do not wear jewelry, make-up, or FINGER nail polish Do not wear lotions, powders, or perfumes.  You may not wear deodorant. Do not shave 48 hours prior to surgery. Do not bring valuables to the hospital.  Leave suitcase in the car. After Surgery it may be brought to your room. For patients being admitted to the hospital, checkout time is 11:00am the day of discharge.  Patients discharged on the day of surgery will not be allowed to drive home.    Remember to use your hibiclens as instructed.Please shower with 1/2 bottle the evening before your surgery and the other 1/2 bottle the morning of surgery.

## 2011-10-18 NOTE — H&P (Signed)
  10/18/2011  History      The patient is a 40 year old white female S/P gastric bypass and recently laparoscoped by Dr. Ovidio Kin. She had been complaining of lower abdominal pain and at the time of this laparoscopy was found to have a large cyst in the pelvis. Dr. Ezzard Standing did not identify any problems with the gastric surgical site and the only other finding was some adhesions in the right lower quadrant. The patient presented for Gyn assessment and it was felt that this mass represented a hemorrhagic ovarian cyst. CA 125 level was wnl limits. She had a follow up ultrasound which revealed reduction in the size of the ovarian cyst but the patient reported no improvement in the pain. Due to the persistence of her symptoms she was scheduled to undergo laparoscopic bilateral salpingo oophorectomy on November 08, 2011 but called our office reporting worsening of her symptoms and requesting that the surgery date be moved up. She is now to have her surgery on 10/19/2011. She understands this could result in a laparotomy and there are risks such as infection, hemorrhage and injury to adjacent structures. The patient has had a prior history.  Allergies:  None Medications : Metformin,Celexa, Crestor, Allopurinol,   Past Medical History is positive for, Diabetes, cirrhosis, hyperlipidemia, depression Past surgical history is positive for vaginal hysterectomy, gastric bypass and diagnostic laparoscopy  ROS  Respiratory. No SOB or cough GI. Positive for abdominal pain especially in lower quadrants and pelvis, no vomiting no diarrhea or constipation,no melena GU. No frequency or urgency, no USI symptoms Gyn. No vaginal bleeding, discharge or itching. It is positive for pelvic pain  Physical exam:  Afebrile  BP 112/66  Pulse 94  Resp 18    General: Patient is a well developed well nourished white female in moderate abdominal distree  Head: Normocephalic and atraumatic Eyes:  PERRLA Neck: Supple no  JVD no thyromegaly Chest: Clear to P&A Heart: regular rhythm no murmur or gallop Abdomen: soft without mass, positive tenderness in lower abdomen but no rebound or guarding. Bowel sounds are present. Recent laparoscopy incisions are noted Pelvic: external genitalia are WNL, BUS wnl, vagina without gross lesion and no significant discharge Adnexa very tender at apex of cull with fullness noted  Neurologic: oriented x's 3   Assessment:    1. Lower abdominal pain 2. Pelvic adhesions  3. Ovarian cysts  Plan:  Laparoscopic bilateral salpingo oophorectomy, possible laparotomy.   Miguel Aschoff MD

## 2011-10-19 ENCOUNTER — Ambulatory Visit (HOSPITAL_COMMUNITY): Payer: Managed Care, Other (non HMO) | Admitting: Anesthesiology

## 2011-10-19 ENCOUNTER — Encounter (HOSPITAL_COMMUNITY): Payer: Self-pay | Admitting: Anesthesiology

## 2011-10-19 ENCOUNTER — Other Ambulatory Visit: Payer: Self-pay | Admitting: Obstetrics and Gynecology

## 2011-10-19 ENCOUNTER — Encounter (HOSPITAL_COMMUNITY)
Admission: RE | Disposition: A | Discharge status: Home / Self Care | Payer: Self-pay | Source: Ambulatory Visit | Attending: Obstetrics and Gynecology

## 2011-10-19 ENCOUNTER — Other Ambulatory Visit: Payer: Self-pay | Admitting: Family Medicine

## 2011-10-19 ENCOUNTER — Inpatient Hospital Stay (HOSPITAL_COMMUNITY)
Admission: RE | Admit: 2011-10-19 | Discharge: 2011-10-21 | DRG: 743 | Disposition: A | Discharge status: Home / Self Care | Payer: Managed Care, Other (non HMO) | Source: Ambulatory Visit | Attending: Obstetrics and Gynecology | Admitting: Obstetrics and Gynecology

## 2011-10-19 DIAGNOSIS — Z5331 Laparoscopic surgical procedure converted to open procedure: Secondary | ICD-10-CM

## 2011-10-19 DIAGNOSIS — Z9884 Bariatric surgery status: Secondary | ICD-10-CM

## 2011-10-19 DIAGNOSIS — N949 Unspecified condition associated with female genital organs and menstrual cycle: Principal | ICD-10-CM | POA: Diagnosis present

## 2011-10-19 DIAGNOSIS — N801 Endometriosis of ovary: Secondary | ICD-10-CM | POA: Diagnosis present

## 2011-10-19 DIAGNOSIS — R102 Pelvic and perineal pain: Secondary | ICD-10-CM

## 2011-10-19 DIAGNOSIS — D649 Anemia, unspecified: Secondary | ICD-10-CM | POA: Diagnosis present

## 2011-10-19 DIAGNOSIS — Z90710 Acquired absence of both cervix and uterus: Secondary | ICD-10-CM

## 2011-10-19 DIAGNOSIS — N80109 Endometriosis of ovary, unspecified side, unspecified depth: Secondary | ICD-10-CM | POA: Diagnosis present

## 2011-10-19 HISTORY — PX: LAPAROTOMY: SHX154

## 2011-10-19 HISTORY — PX: LAPAROSCOPY: SHX197

## 2011-10-19 LAB — BASIC METABOLIC PANEL
Calcium: 9.5 mg/dL (ref 8.4–10.5)
Creatinine, Ser: 0.84 mg/dL (ref 0.50–1.10)
GFR calc Af Amer: >90 mL/min (ref >90)
GFR calc non Af Amer: 86 mL/min — ABNORMAL LOW (ref >90)
Sodium: 138 mEq/L (ref 135–145)

## 2011-10-19 LAB — GLUCOSE, CAPILLARY: Glucose-Capillary: 194 mg/dL — ABNORMAL HIGH (ref 70–99)

## 2011-10-19 LAB — SURGICAL PCR SCREEN
MRSA, PCR: NEGATIVE
Staphylococcus aureus: NEGATIVE

## 2011-10-19 LAB — CBC
MCH: 29.3 pg (ref 26.0–34.0)
MCHC: 32.8 g/dL (ref 30.0–36.0)
MCV: 89.3 fL (ref 78.0–100.0)
Platelets: 110 10*3/uL — ABNORMAL LOW (ref 150–400)
RBC: 4.5 MIL/uL (ref 3.87–5.11)
RDW: 13.1 % (ref 11.5–15.5)

## 2011-10-19 SURGERY — LAPAROSCOPY OPERATIVE
Anesthesia: General | Site: Abdomen | Wound class: Clean

## 2011-10-19 MED ORDER — MEPERIDINE HCL 25 MG/ML IJ SOLN: 6.2500 mg | INTRAMUSCULAR | Status: DC | PRN

## 2011-10-19 MED ORDER — HYDROMORPHONE HCL 1 MG/ML IJ SOLN
INTRAMUSCULAR | Status: AC
  Filled 2011-10-19: qty 1

## 2011-10-19 MED ORDER — LACTATED RINGERS IV SOLN
Freq: Once | INTRAVENOUS | Status: AC
  Administered 2011-10-19: 400 mL via INTRAVENOUS

## 2011-10-19 MED ORDER — FENTANYL CITRATE 0.05 MG/ML IJ SOLN
INTRAMUSCULAR | Status: AC
  Filled 2011-10-19: qty 2

## 2011-10-19 MED ORDER — MORPHINE SULFATE (PF) 1 MG/ML IV SOLN
INTRAVENOUS | Status: DC
  Administered 2011-10-19: 21 mg via INTRAVENOUS
  Administered 2011-10-19 (×2): 25 mg via INTRAVENOUS
  Administered 2011-10-20: 13.45 mL via INTRAVENOUS
  Administered 2011-10-20: 6 mg via INTRAVENOUS
  Administered 2011-10-20: 15 mg via INTRAVENOUS
  Administered 2011-10-20: 7.5 mg via INTRAVENOUS
  Administered 2011-10-20: 4.5 mg via INTRAVENOUS
  Administered 2011-10-21: 10.33 mg via INTRAVENOUS
  Administered 2011-10-21 (×2): 25 mg via INTRAVENOUS
  Administered 2011-10-21: 19.5 mg via INTRAVENOUS
  Administered 2011-10-21: 3 mg via INTRAVENOUS
  Filled 2011-10-19 (×5): qty 25

## 2011-10-19 MED ORDER — SODIUM CHLORIDE 0.9 % IJ SOLN: 9.0000 mL | INTRAMUSCULAR | Status: DC | PRN

## 2011-10-19 MED ORDER — SCOPOLAMINE 1 MG/3DAYS TD PT72
MEDICATED_PATCH | TRANSDERMAL | Status: AC
  Administered 2011-10-19: 1.5 mg via TRANSDERMAL
  Filled 2011-10-19: qty 1

## 2011-10-19 MED ORDER — FENTANYL CITRATE 0.05 MG/ML IJ SOLN
INTRAMUSCULAR | Status: AC
  Administered 2011-10-19: 50 ug via INTRAVENOUS
  Filled 2011-10-19: qty 2

## 2011-10-19 MED ORDER — DEXAMETHASONE SODIUM PHOSPHATE 10 MG/ML IJ SOLN
INTRAMUSCULAR | Status: AC
  Filled 2011-10-19: qty 1

## 2011-10-19 MED ORDER — PROMETHAZINE HCL 25 MG/ML IJ SOLN
INTRAMUSCULAR | Status: AC
  Administered 2011-10-19: 6.25 mg via INTRAVENOUS
  Filled 2011-10-19: qty 1

## 2011-10-19 MED ORDER — PROMETHAZINE HCL 25 MG/ML IJ SOLN
6.2500 mg | INTRAMUSCULAR | Status: DC | PRN
  Administered 2011-10-19: 6.25 mg via INTRAVENOUS

## 2011-10-19 MED ORDER — MIDAZOLAM HCL 2 MG/2ML IJ SOLN
INTRAMUSCULAR | Status: AC
  Filled 2011-10-19: qty 2

## 2011-10-19 MED ORDER — SCOPOLAMINE 1 MG/3DAYS TD PT72
1.0000 | MEDICATED_PATCH | TRANSDERMAL | Status: DC
  Administered 2011-10-19: 1.5 mg via TRANSDERMAL

## 2011-10-19 MED ORDER — LACTATED RINGERS IV SOLN
INTRAVENOUS | Status: DC | PRN
  Administered 2011-10-19 (×3): via INTRAVENOUS

## 2011-10-19 MED ORDER — KETOROLAC TROMETHAMINE 30 MG/ML IJ SOLN
15.0000 mg | Freq: Once | INTRAMUSCULAR | Status: AC | PRN
  Administered 2011-10-19: 30 mg via INTRAVENOUS

## 2011-10-19 MED ORDER — LACTATED RINGERS IV SOLN
INTRAVENOUS | Status: DC
  Administered 2011-10-19: 14:00:00 via INTRAVENOUS

## 2011-10-19 MED ORDER — CEFAZOLIN SODIUM 1-5 GM-% IV SOLN
INTRAVENOUS | Status: AC
  Filled 2011-10-19: qty 50

## 2011-10-19 MED ORDER — DIPHENHYDRAMINE HCL 50 MG/ML IJ SOLN: 12.5000 mg | Freq: Four times a day (QID) | INTRAMUSCULAR | Status: DC | PRN

## 2011-10-19 MED ORDER — LIDOCAINE HCL (CARDIAC) 20 MG/ML IV SOLN
INTRAVENOUS | Status: DC | PRN
  Administered 2011-10-19: 50 mg via INTRAVENOUS

## 2011-10-19 MED ORDER — MIDAZOLAM HCL 5 MG/5ML IJ SOLN
INTRAMUSCULAR | Status: DC | PRN
  Administered 2011-10-19: 2 mg via INTRAVENOUS

## 2011-10-19 MED ORDER — ROCURONIUM BROMIDE 50 MG/5ML IV SOLN
INTRAVENOUS | Status: AC
  Filled 2011-10-19: qty 1

## 2011-10-19 MED ORDER — ONDANSETRON HCL 4 MG/2ML IJ SOLN
INTRAMUSCULAR | Status: DC | PRN
  Administered 2011-10-19: 4 mg via INTRAVENOUS

## 2011-10-19 MED ORDER — DEXAMETHASONE SODIUM PHOSPHATE 4 MG/ML IJ SOLN
INTRAMUSCULAR | Status: DC | PRN
  Administered 2011-10-19: 10 mg via INTRAVENOUS

## 2011-10-19 MED ORDER — KETOROLAC TROMETHAMINE 30 MG/ML IJ SOLN
30.0000 mg | Freq: Four times a day (QID) | INTRAMUSCULAR | Status: DC | PRN
  Administered 2011-10-19 – 2011-10-20 (×2): 30 mg via INTRAVENOUS
  Filled 2011-10-19 (×2): qty 1

## 2011-10-19 MED ORDER — NALOXONE HCL 0.4 MG/ML IJ SOLN: 0.4000 mg | INTRAMUSCULAR | Status: DC | PRN

## 2011-10-19 MED ORDER — HYDROMORPHONE HCL 1 MG/ML IJ SOLN
INTRAMUSCULAR | Status: DC | PRN
  Administered 2011-10-19 (×2): 0.5 mg via INTRAVENOUS

## 2011-10-19 MED ORDER — DIPHENHYDRAMINE HCL 12.5 MG/5ML PO ELIX: 12.5000 mg | ORAL_SOLUTION | Freq: Four times a day (QID) | ORAL | Status: DC | PRN

## 2011-10-19 MED ORDER — FENTANYL CITRATE 0.05 MG/ML IJ SOLN
INTRAMUSCULAR | Status: DC | PRN
  Administered 2011-10-19: 100 ug via INTRAVENOUS
  Administered 2011-10-19: 50 ug via INTRAVENOUS
  Administered 2011-10-19 (×2): 100 ug via INTRAVENOUS
  Administered 2011-10-19 (×2): 50 ug via INTRAVENOUS

## 2011-10-19 MED ORDER — PROMETHAZINE HCL 25 MG/ML IJ SOLN: 25.0000 mg | INTRAMUSCULAR | Status: DC | PRN

## 2011-10-19 MED ORDER — LACTATED RINGERS IV SOLN: INTRAVENOUS | Status: DC | PRN

## 2011-10-19 MED ORDER — MUPIROCIN 2 % EX OINT
TOPICAL_OINTMENT | CUTANEOUS | Status: AC
  Administered 2011-10-19: 10:00:00 via NASAL
  Filled 2011-10-19: qty 22

## 2011-10-19 MED ORDER — GLYCOPYRROLATE 0.2 MG/ML IJ SOLN
INTRAMUSCULAR | Status: AC
  Filled 2011-10-19: qty 1

## 2011-10-19 MED ORDER — FENTANYL CITRATE 0.05 MG/ML IJ SOLN
INTRAMUSCULAR | Status: AC
  Filled 2011-10-19: qty 10

## 2011-10-19 MED ORDER — ONDANSETRON HCL 4 MG/2ML IJ SOLN: 4.0000 mg | Freq: Four times a day (QID) | INTRAMUSCULAR | Status: DC | PRN

## 2011-10-19 MED ORDER — CEFAZOLIN SODIUM 1-5 GM-% IV SOLN
INTRAVENOUS | Status: AC
  Administered 2011-10-19 (×2): 1 g via INTRAVENOUS
  Filled 2011-10-19: qty 50

## 2011-10-19 MED ORDER — PROPOFOL 10 MG/ML IV EMUL
INTRAVENOUS | Status: AC
  Filled 2011-10-19: qty 20

## 2011-10-19 MED ORDER — PROMETHAZINE HCL 25 MG/ML IJ SOLN
25.0000 mg | Freq: Four times a day (QID) | INTRAMUSCULAR | Status: DC | PRN
  Administered 2011-10-19: 25 mg via INTRAVENOUS
  Filled 2011-10-19: qty 1

## 2011-10-19 MED ORDER — NEOSTIGMINE METHYLSULFATE 1 MG/ML IJ SOLN
INTRAMUSCULAR | Status: AC
  Filled 2011-10-19: qty 10

## 2011-10-19 MED ORDER — KETOROLAC TROMETHAMINE 30 MG/ML IJ SOLN
INTRAMUSCULAR | Status: AC
  Filled 2011-10-19: qty 1

## 2011-10-19 MED ORDER — ONDANSETRON HCL 4 MG/2ML IJ SOLN
INTRAMUSCULAR | Status: AC
  Filled 2011-10-19: qty 2

## 2011-10-19 MED ORDER — LIDOCAINE HCL (CARDIAC) 20 MG/ML IV SOLN
INTRAVENOUS | Status: AC
  Filled 2011-10-19: qty 5

## 2011-10-19 MED ORDER — KETOROLAC TROMETHAMINE 30 MG/ML IJ SOLN
INTRAMUSCULAR | Status: AC
  Administered 2011-10-19: 30 mg via INTRAVENOUS
  Filled 2011-10-19: qty 1

## 2011-10-19 MED ORDER — LACTATED RINGERS IV SOLN
INTRAVENOUS | Status: DC
  Administered 2011-10-20 – 2011-10-21 (×6): via INTRAVENOUS

## 2011-10-19 MED ORDER — FENTANYL CITRATE 0.05 MG/ML IJ SOLN
25.0000 ug | INTRAMUSCULAR | Status: DC | PRN
  Administered 2011-10-19 (×3): 50 ug via INTRAVENOUS

## 2011-10-19 MED ORDER — PROPOFOL 10 MG/ML IV EMUL
INTRAVENOUS | Status: DC | PRN
  Administered 2011-10-19: 200 mg via INTRAVENOUS
  Administered 2011-10-19: 50 mg via INTRAVENOUS

## 2011-10-19 MED ORDER — ROCURONIUM BROMIDE 100 MG/10ML IV SOLN
INTRAVENOUS | Status: DC | PRN
  Administered 2011-10-19 (×2): 10 mg via INTRAVENOUS
  Administered 2011-10-19: 40 mg via INTRAVENOUS

## 2011-10-19 MED ORDER — LACTATED RINGERS IR SOLN
Status: DC | PRN
  Administered 2011-10-19: 3000 mL

## 2011-10-19 MED ORDER — NEOSTIGMINE METHYLSULFATE 1 MG/ML IJ SOLN
INTRAMUSCULAR | Status: DC | PRN
  Administered 2011-10-19: 3 mg via INTRAVENOUS

## 2011-10-19 MED ORDER — GLYCOPYRROLATE 0.2 MG/ML IJ SOLN
INTRAMUSCULAR | Status: DC | PRN
  Administered 2011-10-19: .6 mg via INTRAVENOUS

## 2011-10-19 SURGICAL SUPPLY — 47 items
BANDAID FLEXIBLE 1X3 (GAUZE/BANDAGES/DRESSINGS) ×8 IMPLANT
BLADE SURG 10 STRL SS (BLADE) ×4 IMPLANT
BLADE SURG 15 STRL LF C SS BP (BLADE) ×3 IMPLANT
BLADE SURG 15 STRL SS (BLADE) ×1
CABLE HIGH FREQUENCY MONO STRZ (ELECTRODE) ×4 IMPLANT
CATH FOLEY 2WAY SLVR  5CC 14FR (CATHETERS) ×1
CATH FOLEY 2WAY SLVR 5CC 14FR (CATHETERS) ×3 IMPLANT
CATH ROBINSON RED A/P 16FR (CATHETERS) ×4 IMPLANT
CLOTH BEACON ORANGE TIMEOUT ST (SAFETY) ×4 IMPLANT
COVER MAYO STAND STRL (DRAPES) ×4 IMPLANT
DRAPE UTILITY XL STRL (DRAPES) ×4 IMPLANT
DRESSING TELFA 8X3 (GAUZE/BANDAGES/DRESSINGS) ×4 IMPLANT
DRSG COVADERM PLUS 2X2 (GAUZE/BANDAGES/DRESSINGS) ×4 IMPLANT
EVACUATOR SMOKE 8.L (FILTER) ×4 IMPLANT
FORCEPS CUTTING 33CM 5MM (CUTTING FORCEPS) IMPLANT
GAUZE SPONGE 4X4 12PLY STRL LF (GAUZE/BANDAGES/DRESSINGS) ×4 IMPLANT
GLOVE BIO SURGEON STRL SZ7.5 (GLOVE) ×8 IMPLANT
GOWN PREVENTION PLUS LG XLONG (DISPOSABLE) ×8 IMPLANT
GOWN PREVENTION PLUS XLARGE (GOWN DISPOSABLE) ×4 IMPLANT
NS IRRIG 1000ML POUR BTL (IV SOLUTION) ×4 IMPLANT
PACK LAPAROSCOPY BASIN (CUSTOM PROCEDURE TRAY) ×4 IMPLANT
PAD ABD 7.5X8 STRL (GAUZE/BANDAGES/DRESSINGS) ×4 IMPLANT
PENCIL BUTTON HOLSTER BLD 10FT (ELECTRODE) ×4 IMPLANT
POUCH SPECIMEN RETRIEVAL 10MM (ENDOMECHANICALS) IMPLANT
RINGERS IRRIG 1000ML POUR BTL (IV SOLUTION) IMPLANT
SCISSORS MNPLR CVD DVNC (INSTRUMENTS) IMPLANT
SCISSORS MONOPOLAR CVD (INSTRUMENTS)
SET IRRIG TUBING LAPAROSCOPIC (IRRIGATION / IRRIGATOR) ×4 IMPLANT
SPONGE LAP 18X18 X RAY DECT (DISPOSABLE) ×12 IMPLANT
SUT PLAIN 2 0 XLH (SUTURE) ×4 IMPLANT
SUT VIC AB 0 CT1 18XCR BRD8 (SUTURE) ×6 IMPLANT
SUT VIC AB 0 CT1 27 (SUTURE) ×3
SUT VIC AB 0 CT1 27XBRD ANBCTR (SUTURE) ×9 IMPLANT
SUT VIC AB 0 CT1 8-18 (SUTURE) ×2
SUT VIC AB 2-0 SH 27 (SUTURE) ×1
SUT VIC AB 2-0 SH 27XBRD (SUTURE) ×3 IMPLANT
SUT VIC AB 4-0 PS2 27 (SUTURE) ×4 IMPLANT
SUT VICRYL 0 TIES 12 18 (SUTURE) ×4 IMPLANT
SUT VICRYL 4-0 PS2 18IN ABS (SUTURE) ×4 IMPLANT
TAPE CLOTH SURG 4X10 WHT LF (GAUZE/BANDAGES/DRESSINGS) ×4 IMPLANT
TOWEL OR 17X24 6PK STRL BLUE (TOWEL DISPOSABLE) ×8 IMPLANT
TROCAR XCEL 12X100 BLDLESS (ENDOMECHANICALS) ×4 IMPLANT
TROCAR Z-THREAD BLADED 11X100M (TROCAR) ×4 IMPLANT
TUBING SUCTION BULK 100 FT (MISCELLANEOUS) ×4 IMPLANT
WARMER LAPAROSCOPE (MISCELLANEOUS) ×4 IMPLANT
WATER STERILE IRR 1000ML POUR (IV SOLUTION) ×4 IMPLANT
YANKAUER SUCT BULB TIP NO VENT (SUCTIONS) ×4 IMPLANT

## 2011-10-19 NOTE — Transfer of Care (Signed)
Immediate Anesthesia Transfer of Care Note  Patient: [name]  Procedure(s) Performed:  LAPAROSCOPY OPERATIVE - Operative Laparoscopy with Lysis Of Adhesions; LAPAROTOMY - Laparotomy With Bilateral Oophorectomy  Patient Location: PACU  Anesthesia Type: General  Level of Consciousness: awake and alert   Airway & Oxygen Therapy: Patient Spontanous Breathing and Patient connected to nasal cannula oxygen  Post-op Assessment: Report given to PACU RN and Post -op Vital signs reviewed and stable  Post vital signs: Reviewed and stable  Complications: No apparent anesthesia complications

## 2011-10-19 NOTE — Anesthesia Postprocedure Evaluation (Signed)
Anesthesia Post Note  Patient: [name]  Procedure(s) Performed:  LAPAROSCOPY OPERATIVE - Operative Laparoscopy with Lysis Of Adhesions; LAPAROTOMY - Laparotomy With Bilateral Oophorectomy  Anesthesia type: GA  Patient location: PACU  Post pain: Pain level controlled  Post assessment: Post-op Vital signs reviewed  Last Vitals:  Filed Vitals:   10/19/11 1430  BP: 112/56  Pulse: 95  Temp: 37.2 C  Resp: 16    Post vital signs: Reviewed  Level of consciousness: sedated  Complications: No apparent anesthesia complications

## 2011-10-19 NOTE — Progress Notes (Signed)
  10/19/2011  S. Patient reports nausea and some flank pain like kidney stone. Did have stone noted on prior CT scan  O> Afebrile BP 107/64  Pulse 100       Abdomen soft dressing dry       H/H pending  A. Stable post op   Plan:   Will add toradol to pain meds and also phenergan.  Encourage po fluids.  Miguel Aschoff

## 2011-10-19 NOTE — Telephone Encounter (Signed)
Last refill 08-19-11 last OV

## 2011-10-19 NOTE — Telephone Encounter (Signed)
Last OV 08-19-11 Last Refill 08-19-11

## 2011-10-19 NOTE — Progress Notes (Signed)
No change in status or exam. Will proceed with planned surgery.  [name]

## 2011-10-19 NOTE — Anesthesia Preprocedure Evaluation (Addendum)
Anesthesia Evaluation  Patient identified by MRN, date of birth, ID band Patient awake  General Assessment Comment  Reviewed: Allergy & Precautions, H&P , NPO status , Patient's Chart, lab work & pertinent test results, reviewed documented beta blocker date and time   History of Anesthesia Complications (+) PONV  Airway Mallampati: II TM Distance: >3 FB Neck ROM: full    Dental  (+) Teeth Intact   Pulmonary    Pulmonary exam normal       Cardiovascular     Neuro/Psych PSYCHIATRIC DISORDERS Depression    GI/Hepatic negative GI ROS Neg liver ROS    Endo/Other  Diabetes mellitus-, Well Controlled, Type 2, Oral Hypoglycemic Agents  Renal/GU negative Renal ROS  Genitourinary negative   Musculoskeletal negative musculoskeletal ROS (+)   Abdominal Normal abdominal exam  (+)   Peds negative pediatric ROS (+)  Hematology negative hematology ROS (+)   Anesthesia Other Findings   Reproductive/Obstetrics negative OB ROS                           Anesthesia Physical Anesthesia Plan  ASA: II  Anesthesia Plan: General   Post-op Pain Management:    Induction: Intravenous  Airway Management Planned: Oral ETT  Additional Equipment:   Intra-op Plan:   Post-operative Plan: Extubation in OR  Informed Consent: I have reviewed the patients History and Physical, chart, labs and discussed the procedure including the risks, benefits and alternatives for the proposed anesthesia with the patient or authorized representative who has indicated his/her understanding and acceptance.   Dental Advisory Given  Plan Discussed with: CRNA  Anesthesia Plan Comments: (BS today is 134. Pt will get a scop disc this helped her PONV with last surgery at [facility])        Anesthesia Quick Evaluation

## 2011-10-19 NOTE — OR Nursing (Signed)
Surgical procedure was Laparoscopy With Lysis Of Adhesions; Laparotomy with Bilateral Oohphorectomy.

## 2011-10-19 NOTE — Brief Op Note (Signed)
10/19/2011  1:23 PM  PATIENT:  Rip Harbour  40 y.o. female  PRE-OPERATIVE DIAGNOSIS:  CHRONIC PELVIC PAIN OVARIAN CYSTS  POST-OPERATIVE DIAGNOSIS:  chronic pelvic pain; ovarian cysts dense pelvic adhesions  PROCEDURE:  Procedure(s): LAPAROSCOPY OPERATIVE SALPINGO OOPHERECTOMY LAPAROTOMY Lysis of adhesions bilateral salpingo oophorectomy  SURGEON:  Surgeon(s): Miguel Aschoff W Lodema Hong  PHYSICIAN ASSISTANT:  Lodema Hong  ANESTHESIA:   general  EBL:  Total I/O In: 2000 [I.V.:2000] Out: 200 [Urine:100; Blood:100]  BLOOD ADMINISTERED:none  DRAINS: none   LOCAL MEDICATIONS USED:  NONE  SPECIMEN:  Source of Specimen:  Bilateral ovaries and tubal remnants  DISPOSITION OF SPECIMEN:  PATHOLOGY  COUNTS:  YES  TOURNIQUET:  * No tourniquets in log *  DICTATION: .Other Dictation: Dictation Number [id number]  PLAN OF CARE: Admit to inpatient   PATIENT DISPOSITION:  PACU - hemodynamically stable.   Delay start of Pharmacological VTE agent (>24hrs) due to surgical blood loss or risk of bleeding:  not applicable patient has SCD unit placed

## 2011-10-20 ENCOUNTER — Telehealth: Payer: Self-pay | Admitting: Family Medicine

## 2011-10-20 ENCOUNTER — Encounter (HOSPITAL_COMMUNITY): Payer: Self-pay | Admitting: Obstetrics and Gynecology

## 2011-10-20 LAB — CBC
MCH: 29.8 pg (ref 26.0–34.0)
MCV: 89.3 fL (ref 78.0–100.0)
Platelets: 95 10*3/uL — ABNORMAL LOW (ref 150–400)
RBC: 2.99 MIL/uL — ABNORMAL LOW (ref 3.87–5.11)

## 2011-10-20 MED ORDER — LACTATED RINGERS IV SOLN
Freq: Once | INTRAVENOUS | Status: AC
  Administered 2011-10-20: 400 mL via INTRAVENOUS

## 2011-10-20 NOTE — Progress Notes (Addendum)
Dr. Tenny Craw updated x3, updated of low urine output and H&H results. See orders. Vital signs stable.

## 2011-10-20 NOTE — Telephone Encounter (Signed)
call from [facility] refill for xanax sent electronically - ased if it could be resent they cant read rx

## 2011-10-20 NOTE — Telephone Encounter (Signed)
i'm confused- our scripts are printed by the computer.  There should be no difficulty w/ reading the prescription.  Please make sure this is a legitimate request.

## 2011-10-20 NOTE — Progress Notes (Signed)
  S. Stable this AM pain is responding to PCA morphine. Had issues with low urine output felt secondary volume to depletion. Now taking PO well.  O: Afebrile       Abdomen soft wound healing well. Incision is dry      Hg. 8.9  Last PM 9.1 was 13 pre op  A. Stable S/P exp lap BSO. Post op anemia  Plan: D/C foley          Continue IV LR at 200 cc per hour          Will start iron on discharge            Repeat CBC in AM  Miguel Aschoff MD

## 2011-10-20 NOTE — Op Note (Signed)
NAMEAUBRIEE, SZETO NO.:  [id number]  MEDICAL RECORD NO.:  [id number]  LOCATION:  9320                          FACILITY:  [facility]  PHYSICIAN:  Miguel Aschoff, M.D.       DATE OF BIRTH:  1971/01/03  DATE OF PROCEDURE:  10/19/2011 DATE OF DISCHARGE:                              OPERATIVE REPORT   PREOPERATIVE DIAGNOSES:  Pelvic pain.  Pelvic adhesions.  Dense adhesions of ovary to bladder, cul-de-sac, and pelvic sidewall, and bowel.  PROCEDURE:  Diagnostic laparoscopy followed by laparotomy with bilateral salpingo-oophorectomy and lysis of adhesions.  SURGEON:  Miguel Aschoff, MD  ASSISTANT:  Luvenia Redden, MD  ANESTHESIA:  General.  COMPLICATIONS:  None.  JUSTIFICATION:  The patient is a 40 year old white female who underwent previous hysterectomy.  The patient has been complaining of severe abdominal pain, was assessed by Dr. Ovidio Kin in September via laparoscopy, at which time, she was noted have adhesions in her lower abdomen, but in addition, a pelvic mass filling the cul-de-sac.  This was photographed by Dr. Ezzard Standing and the mass identified.  Because of this and a persistent pain, she is being brought to the operating room at this time to undergo laparoscopy to see if this ovarian mass can be excised laparoscopically and if not, the patient is to undergo laparotomy.  The risks and benefits of the procedure were discussed with the patient and informed consent has been obtained.  The patient and her husband understand that if the ovaries were removed this will resultant surgical menopause and that hormone replacement therapy may be indicated.  In addition, the risks of injuries to adjacent structures, hemorrhage, and infection have been discussed with the patient. Informed consent has been obtained.  PROCEDURE:  The patient was taken to the operating room, placed in the supine position, and general anesthesia was administered without difficulty.  She was  then placed in modified lithotomy position, prepped and draped in usual sterile fashion.  Bladder was catheterized.  Foley catheter was inserted.  Attention was then directed to the umbilicus where a small infraumbilical incision was made and Veress needle was inserted.  The abdomen was insufflated with 3 liters of CO2.  Under monitored pressure once this was done, the trocar to laparoscope was placed followed by laparoscope itself.  Once this was done under direct visualization, two accessory ports were established, a 5-mm port in the right lower quadrant and an 11-mm port in the left lower quadrant.  Once this was done, systematic inspection showed the anterior bladder peritoneum to be unremarkable.  The remarkable finding, however, was that the right and left ovaries were adherent in the midline to the dome of the bladder densely adherent to the bladder, densely adherent to each other and densely adherent to the rectosigmoid of the cul-de-sac. Attempts were made to try to release these adhesions via the cul-de-sac, but because of the density and fear of injuring an adjacent structure was elected to abandon the laparoscopic oophorectomy and proceed with laparotomy were better visualization and exposure could be obtained.  At this point, the laparoscopic instruments were removed.  A Pfannenstiel incision was made extending down through the subcutaneous tissue  with bleeding points being clamped and coagulated as they were encountered. The fascia was then identified and incised transversely and separated from the underlying rectus muscles.  Rectus muscles were divided in the midline.  The peritoneum was then identified and entered carefully avoiding underlying structures.  At this point, a self-retaining retractor was placed through the wound, the viscera was packed out of the pelvis.  At this point, the adhesions of the sigmoid colon to the cul-de-sac and posterior surface of the ovaries  were taken down sharply with care to avoid any injury to the colon.  This was done successfully. There was no apparent injury to the rectosigmoid.  It was then possible to dissect the dense adhesions holding the ovary to the cul-de-sac and posterior surface of the vagina and bladder.  Once these adhesions were taken down, it was possible to elevate the ovaries.  Once this was done, the right infundibulopelvic ligament was identified, the ureter was identified with the ureter being out of the field.  The infundibulopelvic ligament was suture ligated with a suture ligature of 0 Vicryl.  The pedicle was then cut and then dissection was carried out medially during this dissection, it was possible to dissect the bladder off the ovary without difficulty without injury to the bladder.  This resulted in freeing the right ovary; however, this ovary was still again adherent to the left ovary.  Once this was done, attention was directed to the left infundibulopelvic ligament.  Again, the adhesions in these area were taken down sharply with care to avoid any injury to the colon. The infundibulopelvic ligament was identified, clamped with Kelly clamps, cut and double ligated using a suture ligature of 0 Vicryl, then a free tie of 0 Vicryl.  It was then possible to carry out the dissection medially meeting in the midline.  The dissection then freed the left ovary and then the specimen, which consisted of the adherent right and left ovaries were passed off the surgical field.  The small areas of oozing were noted at the dissection site, these were promptly brought under control with electrocautery.  The denuded areas of the appendices epiploicae were adherent to the posterior surface of the cul- de-sac ovaries and vagina were then repaired using interrupted 4-0 Vicryl sutures.  Again, the final inspection was made of the rectum to ensure that no injury had occurred when this was dissected off the  cul- de-sac and again it was apparent that no injury was present.  Once this was done, the abdomen was irrigated with warm saline.  Lap counts and instrument counts were taken and found to be correct.  Hemostasis was excellent and at this point, the abdomen was closed.  The parietal peritoneum was closed using running continuous 0 Vicryl suture, the rectus muscles were reapproximated using running continuous 0 Vicryl suture, the fascia was reapproximated using two sutures of 0 Vicryl each starting at the lateral fascial angles and meeting in the midline.  The subcutaneous tissue was closed using interrupted 2-0 Vicryl suture and the skin incision was closed using subcuticular 4-0 Vicryl.  The previously placed laparoscopy incisions were closed using 0 Vicryl for the subcutaneous areas and then subcuticular 4-0 Vicryl for the skin. Steri-Strips were applied.  The patient reversed from the anesthetic and taken to the recovery room in satisfactory condition.  The estimated blood loss was approximately 50 mL.  The patient tolerated the procedure well and went to the recovery room in satisfactory condition.  Plan is for  the patient to be admitted as an inpatient.     Miguel Aschoff, M.D.     AR/MEDQ  D:  10/19/2011  T:  10/20/2011  Job:  [id number]  cc:   Sandria Bales. Ezzard Standing, M.D. [address] [place] [address]

## 2011-10-20 NOTE — Telephone Encounter (Signed)
Called [facility] pharmacy and they advised that the fax sent by our office had been blackened out, advised of dosage/instructions Pharmacy understood and will fax back the copy for our records

## 2011-10-20 NOTE — Progress Notes (Signed)
UR Chart review completed.  

## 2011-10-21 ENCOUNTER — Inpatient Hospital Stay (HOSPITAL_COMMUNITY)
Admission: RE | Admit: 2011-10-21 | Discharge: 2011-10-21 | Payer: Managed Care, Other (non HMO) | Source: Ambulatory Visit

## 2011-10-21 NOTE — Progress Notes (Signed)
  10/21/2011  S. Doing well taking regular diet, voiding, passing gas, Wants to go home.  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 D/C home       RTC in 4 weeks       Meds: Tylox one every 3 to 4 hours as needed for pain                  Slo Fe iron one daily                  Estradiol 1 mg one daily                  Laxative of choice                  Regular diet                  Condition improved    Miguel Aschoff MD

## 2011-10-21 NOTE — Progress Notes (Signed)
UR chart review completed.  

## 2011-10-29 NOTE — Discharge Summary (Signed)
[name], [name] NO.:  [id number]  MEDICAL RECORD NO.:  [id number]  LOCATION:  9320                          FACILITY:  [facility]  PHYSICIAN:  Miguel Aschoff, M.D.       DATE OF BIRTH:  11-07-1971  DATE OF ADMISSION:  10/19/2011 DATE OF DISCHARGE:  10/21/2011                              DISCHARGE SUMMARY   ADMISSION DIAGNOSIS:  Pelvic pain.  FINAL DIAGNOSES:  Pelvic pain, pelvic adhesions, ovarian cyst.  BRIEF HISTORY:  The patient is a 39 year old white female with history of pelvic pain who was operated on by Dr. Ovidio Kin and found to have a mass in the pelvis.  Because of this mass, the patient was referred for GYN evaluation.  Examination revealed a tender mass at the apex of the vaginal culture and ultrasound examination was carried out, which initially appeared to show this to be consistent with a hemorrhagic cyst.  The patient was treated conservatively; however, the pain did not resolve, and because of her severe pelvic pain, it became necessary to take the patient to the operating room to perform a laparoscopy and possible laparotomy in an effort to relieve the pelvic pain via bilateral salpingo-oophorectomy.  HOSPITAL COURSE:  Preoperative studies were obtained.  The patient had an admission hemoglobin of 13.  Chemistry panel was essentially within normal limits.  PT and PTT were within normal limits.  On October 19, 2011, the patient was taken to the operating room where under general anesthesia, laparoscopy was carried out.  The findings at laparoscopy revealed the ovaries to be laterally adhesed to each other in the midline attached to the bladder and apex of the vaginal cuff, and posteriorly to the rectosigmoid.  Attempts were made to try to release these adhesions via the laparoscope, but because of the dense adhesions to the rectosigmoid, it became necessary to perform a laparotomy to ensure that no injury to the colon would be encountered.   Laparotomy was performed and then it was possible to free the colon off the posterior surface of the ovaries and vagina and then it was possible to carry out the bilateral salpingo-oophorectomy without difficulty.  The patient's postoperative course was essentially uncomplicated except for drop in her hemoglobin, which reached a nadir of 8.9.  She did tolerate increasing ambulation and diet well and by the second postoperative day, was able to be discharged home.  Medications for home included Tylox 1 every 3-4 hours as needed for pain.  She was given a prescription for estradiol 1 mg if she developed hot flashes.  She was instructed no heavy lifting, place nothing in the vagina.  She was sent home on a regular diet in satisfactory condition and was told to make a follow up visit for 4 weeks.  She was also told to resume all her prior preoperative medications.  She was sent home in satisfactory condition. The final pathology report on the ovaries revealed adhesions and endometriosis, and no other pathologic findings were found.     Miguel Aschoff, M.D.    AR/MEDQ  D:  10/28/2011  T:  10/29/2011  Job:  [id number]

## 2011-11-23 ENCOUNTER — Emergency Department (HOSPITAL_BASED_OUTPATIENT_CLINIC_OR_DEPARTMENT_OTHER)
Admission: EM | Admit: 2011-11-23 | Discharge: 2011-11-24 | Disposition: A | Discharge status: Home / Self Care | Payer: Managed Care, Other (non HMO) | Attending: Emergency Medicine | Admitting: Emergency Medicine

## 2011-11-23 ENCOUNTER — Other Ambulatory Visit: Payer: Self-pay | Admitting: Family Medicine

## 2011-11-23 ENCOUNTER — Encounter (HOSPITAL_BASED_OUTPATIENT_CLINIC_OR_DEPARTMENT_OTHER): Payer: Self-pay | Admitting: Emergency Medicine

## 2011-11-23 ENCOUNTER — Emergency Department (INDEPENDENT_AMBULATORY_CARE_PROVIDER_SITE_OTHER): Payer: Managed Care, Other (non HMO)

## 2011-11-23 DIAGNOSIS — R109 Unspecified abdominal pain: Secondary | ICD-10-CM | POA: Insufficient documentation

## 2011-11-23 DIAGNOSIS — E119 Type 2 diabetes mellitus without complications: Secondary | ICD-10-CM | POA: Insufficient documentation

## 2011-11-23 DIAGNOSIS — E785 Hyperlipidemia, unspecified: Secondary | ICD-10-CM | POA: Insufficient documentation

## 2011-11-23 DIAGNOSIS — R112 Nausea with vomiting, unspecified: Secondary | ICD-10-CM

## 2011-11-23 DIAGNOSIS — Z79899 Other long term (current) drug therapy: Secondary | ICD-10-CM | POA: Insufficient documentation

## 2011-11-23 DIAGNOSIS — Z9884 Bariatric surgery status: Secondary | ICD-10-CM

## 2011-11-23 LAB — BASIC METABOLIC PANEL
BUN: 18 mg/dL (ref 6–23)
Calcium: 9.3 mg/dL (ref 8.4–10.5)
GFR calc Af Amer: >90 mL/min (ref >90)
GFR calc non Af Amer: 80 mL/min — ABNORMAL LOW (ref >90)
Potassium: 3.8 mEq/L (ref 3.5–5.1)
Sodium: 139 mEq/L (ref 135–145)

## 2011-11-23 LAB — URINALYSIS, ROUTINE W REFLEX MICROSCOPIC
Hgb urine dipstick: NEGATIVE
Leukocytes, UA: NEGATIVE
Nitrite: NEGATIVE
Specific Gravity, Urine: 1.022 (ref 1.005–1.030)
Urobilinogen, UA: 1 mg/dL (ref 0.0–1.0)

## 2011-11-23 MED ORDER — ONDANSETRON HCL 4 MG/2ML IJ SOLN
4.0000 mg | Freq: Once | INTRAMUSCULAR | Status: AC
  Administered 2011-11-23: 4 mg via INTRAVENOUS
  Filled 2011-11-23: qty 2

## 2011-11-23 MED ORDER — SODIUM CHLORIDE 0.9 % IV BOLUS (SEPSIS)
1000.0000 mL | INTRAVENOUS | Status: AC
  Administered 2011-11-23: 1000 mL via INTRAVENOUS

## 2011-11-23 MED ORDER — HYDROMORPHONE HCL PF 1 MG/ML IJ SOLN
1.0000 mg | Freq: Once | INTRAMUSCULAR | Status: AC
  Administered 2011-11-23: 1 mg via INTRAVENOUS
  Filled 2011-11-23: qty 1

## 2011-11-23 NOTE — ED Provider Notes (Signed)
History  Scribed for Lynn Roller, MD, the patient was seen in [facility]. The chart was scribed by Gilman Schmidt. The patients care was started at 11:26 PM.   CSN: [id number] Arrival date & time: 11/23/2011 10:13 PM   First MD Initiated Contact with Patient 11/23/11 2316      Chief Complaint  Patient presents with  . Flank Pain     HPI [name] is a 40 y.o. female who presents to the Emergency Department complaining of right flank pain onset ~7 hours. Reports gradual onset with constant pain. Symptoms are alleviated by laying still and with pressure. Exacerbation upon coughing or sneezing. Pt has not had any relief of symptoms today. Denies any pain radiating to abdomen. Associated symptoms of nausea and vomiting (6-7 times). Pt has had normal bowel movements. Denies any fevers, chills, cough, diarrhea, swelling, rash, or dysuria. Pt notes history of Gastric Bypass in Nov 2001. States kidneys stones have been present for the last 10 years and have increased after Gastric Bypass (last 6-8 years). There are no other associated symptoms and no other alleviating or aggravating factors.     Past Medical History  Diagnosis Date  . Hyperlipidemia   . Diabetes mellitus   . Kidney stones   . Recurrent UTI   . Non-alcoholic fatty liver disease   . Weight decrease   . Chills   . Fatigue     loss of sleep  . Dizziness   . Generalized headaches   . Wears glasses   . Blood in urine   . Liver disease   . Poor appetite   . Nausea & vomiting   . Abdominal pain   . Blurred vision     when feeling very fatigued   . Ovarian cyst     Past Surgical History  Procedure Date  . Cholecystectomy 2005  . Partial hysterectomy 2008  . Kidney stones   . Gastric bypass 10/2010  . Tubal ligation     x3  . Abdominal hysterectomy   . Cystostomy w/ bladder biopsy   . Exploratory laparotomy 09/01/11  . Laparoscopy 10/19/2011    Procedure: LAPAROSCOPY OPERATIVE;  Surgeon: Miguel Aschoff;  Location:  [facility];  Service: Gynecology;  Laterality: N/A;  Operative Laparoscopy with Lysis Of Adhesions  . Laparotomy 10/19/2011    Procedure: LAPAROTOMY;  Surgeon: Miguel Aschoff;  Location: [facility];  Service: Gynecology;  Laterality: Bilateral;  Laparotomy With Bilateral Oophorectomy    Family History  Problem Relation Age of Onset  . Lung cancer Mother   . Cancer Mother     lung  . Hyperlipidemia Father   . Heart disease Father   . Stroke Father   . Hypertension Father   . Diabetes Father   . Hypertension Brother   . Other Brother     suicide    History  Substance Use Topics  . Smoking status: Never Smoker   . Smokeless tobacco: Not on file  . Alcohol Use: No    OB History    Grav Para Term Preterm Abortions TAB SAB Ect Mult Living                  Review of Systems  Constitutional: Negative for fever and chills.  Respiratory: Negative for cough.   Cardiovascular: Negative for leg swelling.  Gastrointestinal: Negative for abdominal pain and diarrhea.  Genitourinary: Positive for flank pain. Negative for dysuria, hematuria, decreased urine volume and difficulty urinating.  Skin: Negative for rash.  Neurological: Negative for headaches.  All other systems reviewed and are negative.    Allergies  Review of patient's allergies indicates no known allergies.  Home Medications   Current Outpatient Rx  Name Route Sig Dispense Refill  . ALPRAZOLAM 0.5 MG PO TABS  TAKE ONE TABLET BY MOUTH THREE TIMES DAILY AS NEEDED FOR SLEEP AND ANXIETY 30 tablet 1  . CHLORTHALIDONE 25 MG PO TABS Oral Take 25 mg by mouth daily.      Marland Kitchen CITALOPRAM HYDROBROMIDE 20 MG PO TABS Oral Take 1 tablet (20 mg total) by mouth daily. 30 tablet 2  . METFORMIN HCL 500 MG PO TABS Oral Take 1 tablet (500 mg total) by mouth 2 (two) times daily with a meal. 60 tablet 3  . POTASSIUM CHLORIDE 20 MEQ PO PACK Oral Take 20 mEq by mouth 2 (two) times daily.      Marland Kitchen ROSUVASTATIN CALCIUM 20 MG PO TABS Oral Take 1 tablet (20  mg total) by mouth daily. 30 tablet 3  . CHLOROTHIAZIDE PO Oral Take 25 mg by mouth daily.      . TRAMADOL HCL 50 MG PO TABS Oral Take 1 tablet (50 mg total) by mouth every 6 (six) hours as needed for pain. 45 tablet 0    BP 97/61  Pulse 96  Temp(Src) 98.1 F (36.7 C) (Oral)  Resp 18  Ht 5\' 4"  (1.626 m)  Wt 182 lb (82.555 kg)  BMI 31.24 kg/m2  SpO2 99%  Physical Exam  Nursing note and vitals reviewed. Constitutional: She is oriented to person, place, and time. She appears well-developed and well-nourished.  Non-toxic appearance. She does not have a sickly appearance.  HENT:  Head: Normocephalic and atraumatic.  Eyes: Conjunctivae, EOM and lids are normal. Pupils are equal, round, and reactive to light. No scleral icterus.  Neck: Trachea normal and normal range of motion. Neck supple.  Cardiovascular: Regular rhythm and normal heart sounds.   Pulmonary/Chest: Effort normal and breath sounds normal.  Abdominal: Soft. Normal appearance. There is no tenderness. There is no rebound, no guarding and no CVA tenderness.       "Flank pain felt better with palpation and percussion"  Musculoskeletal: Normal range of motion. She exhibits no edema.  Neurological: She is alert and oriented to person, place, and time. She has normal strength.  Skin: Skin is warm, dry and intact. No rash noted.    ED Course  Procedures (including critical care time)  DIAGNOSTIC STUDIES: Oxygen Saturation is 99% on room air, normal by my interpretation.    COORDINATION OF CARE: 11:26pm:  - Patient evaluated by ED physician, Dilaudid, Zofran, DG Abd Acute, CBC, Diff, BMP, UA ordered  LABS Results for orders placed during the hospital encounter of 11/23/11  URINALYSIS, ROUTINE W REFLEX MICROSCOPIC      Component Value Range   Color, Urine YELLOW  YELLOW    Appearance CLEAR  CLEAR    Specific Gravity, Urine 1.022  1.005 - 1.030    pH 5.5  5.0 - 8.0    Glucose, UA NEGATIVE  NEGATIVE (mg/dL)   Hgb urine  dipstick NEGATIVE  NEGATIVE    Bilirubin Urine NEGATIVE  NEGATIVE    Ketones, ur NEGATIVE  NEGATIVE (mg/dL)   Protein, ur NEGATIVE  NEGATIVE (mg/dL)   Urobilinogen, UA 1.0  0.0 - 1.0 (mg/dL)   Nitrite NEGATIVE  NEGATIVE    Leukocytes, UA NEGATIVE  NEGATIVE   CBC      Component Value Range  WBC 5.7  4.0 - 10.5 (K/uL)   RBC 4.01  3.87 - 5.11 (MIL/uL)   Hemoglobin 11.8 (*) 12.0 - 15.0 (g/dL)   HCT 95.6 (*) 21.3 - 46.0 (%)   MCV 88.3  78.0 - 100.0 (fL)   MCH 29.4  26.0 - 34.0 (pg)   MCHC 33.3  30.0 - 36.0 (g/dL)   RDW 08.6  57.8 - 46.9 (%)   Platelets 102 (*) 150 - 400 (K/uL)  DIFFERENTIAL      Component Value Range   Neutrophils Relative 33 (*) 43 - 77 (%)   Lymphocytes Relative 55 (*) 12 - 46 (%)   Monocytes Relative 6  3 - 12 (%)   Eosinophils Relative 5  0 - 5 (%)   Basophils Relative 1  0 - 1 (%)   Neutro Abs 1.9  1.7 - 7.7 (K/uL)   Lymphs Abs 3.1  0.7 - 4.0 (K/uL)   Monocytes Absolute 0.3  0.1 - 1.0 (K/uL)   Eosinophils Absolute 0.3  0.0 - 0.7 (K/uL)   Basophils Absolute 0.1  0.0 - 0.1 (K/uL)   RBC Morphology TEARDROP CELLS     WBC Morphology WHITE COUNT CONFIRMED ON SMEAR     Smear Review PLATELET COUNT CONFIRMED BY SMEAR    BASIC METABOLIC PANEL      Component Value Range   Sodium 139  135 - 145 (mEq/L)   Potassium 3.8  3.5 - 5.1 (mEq/L)   Chloride 100  96 - 112 (mEq/L)   CO2 29  19 - 32 (mEq/L)   Glucose, Bld 97  70 - 99 (mg/dL)   BUN 18  6 - 23 (mg/dL)   Creatinine, Ser 6.29  0.50 - 1.10 (mg/dL)   Calcium 9.3  8.4 - 52.8 (mg/dL)   GFR calc non Af Amer 80 (*) >90 (mL/min)   GFR calc Af Amer >90  >90 (mL/min)     Radiology: DG Abd Acute W/Chest. Reviewed by me. IMPRESSION: 1. Unremarkable bowel gas pattern; no free intra-abdominal air seen. 2. No acute cardiopulmonary process identified. Original Report Authenticated By: Tonia Ghent, M.D.     MDM  At this time patient is well-appearing, laboratory workup unremarkable, imaging negative for bowel  obstruction an exam not consistent with abdominal pathology. Very soft very nontender and nondistended. Vital signs normal at this time without any tachycardia or fever. Pain medication given with significant improvement, IV fluids for rehydration and imaging reviewed showing no bowel obstruction. Patient is amenable to discharge  I personally performed the services described in this documentation, which was scribed in my presence. The recorded information has been reviewed and considered.         Lynn Roller, MD 11/24/11 [phone]

## 2011-11-23 NOTE — ED Notes (Signed)
Pt c/o right flank pain with nausea. Pt has known kidney stones in right kidney

## 2011-11-24 ENCOUNTER — Other Ambulatory Visit: Payer: Self-pay | Admitting: Family Medicine

## 2011-11-24 LAB — DIFFERENTIAL
Basophils Absolute: 0.1 10*3/uL (ref 0.0–0.1)
Eosinophils Relative: 5 % (ref 0–5)
Lymphocytes Relative: 55 % — ABNORMAL HIGH (ref 12–46)
Monocytes Relative: 6 % (ref 3–12)
Neutrophils Relative %: 33 % — ABNORMAL LOW (ref 43–77)

## 2011-11-24 LAB — CBC
Platelets: 102 10*3/uL — ABNORMAL LOW (ref 150–400)
RBC: 4.01 MIL/uL (ref 3.87–5.11)
WBC: 5.7 10*3/uL (ref 4.0–10.5)

## 2011-11-24 MED ORDER — ROSUVASTATIN CALCIUM 20 MG PO TABS: 20.0000 mg | ORAL_TABLET | Freq: Every day | ORAL | Status: DC

## 2011-11-24 MED ORDER — OXYCODONE-ACETAMINOPHEN 5-325 MG PO TABS: 1.0000 | ORAL_TABLET | ORAL | Status: AC | PRN

## 2011-11-24 MED ORDER — METFORMIN HCL 500 MG PO TABS: 500.0000 mg | ORAL_TABLET | Freq: Two times a day (BID) | ORAL | Status: DC

## 2011-11-24 MED ORDER — OXYCODONE-ACETAMINOPHEN 5-325 MG PO TABS
2.0000 | ORAL_TABLET | Freq: Once | ORAL | Status: AC
  Administered 2011-11-24: 2 via ORAL
  Filled 2011-11-24: qty 2

## 2011-11-24 NOTE — Telephone Encounter (Signed)
rx sent to pharmacy by e-script Pt needs OV before any more refills

## 2011-12-01 ENCOUNTER — Other Ambulatory Visit: Payer: Self-pay | Admitting: Family Medicine

## 2011-12-02 ENCOUNTER — Other Ambulatory Visit: Payer: Self-pay | Admitting: Family Medicine

## 2011-12-02 MED ORDER — METFORMIN HCL 500 MG PO TABS: 500.0000 mg | ORAL_TABLET | Freq: Two times a day (BID) | ORAL | Status: DC

## 2011-12-02 NOTE — Telephone Encounter (Signed)
Last OV 08-19-11 last refill 10-19-11 #30 with 1 refill

## 2011-12-02 NOTE — Telephone Encounter (Signed)
rx sent to pharmacy by e-script  

## 2011-12-02 NOTE — Telephone Encounter (Signed)
Manually faxed refill to pharmacy

## 2011-12-16 ENCOUNTER — Encounter: Payer: Self-pay | Admitting: Family Medicine

## 2011-12-16 ENCOUNTER — Ambulatory Visit (INDEPENDENT_AMBULATORY_CARE_PROVIDER_SITE_OTHER): Payer: Managed Care, Other (non HMO) | Admitting: Family Medicine

## 2011-12-16 DIAGNOSIS — E785 Hyperlipidemia, unspecified: Secondary | ICD-10-CM | POA: Insufficient documentation

## 2011-12-16 DIAGNOSIS — B009 Herpesviral infection, unspecified: Secondary | ICD-10-CM

## 2011-12-16 DIAGNOSIS — B001 Herpesviral vesicular dermatitis: Secondary | ICD-10-CM | POA: Insufficient documentation

## 2011-12-16 DIAGNOSIS — E119 Type 2 diabetes mellitus without complications: Secondary | ICD-10-CM

## 2011-12-16 DIAGNOSIS — I959 Hypotension, unspecified: Secondary | ICD-10-CM | POA: Insufficient documentation

## 2011-12-16 HISTORY — DX: Herpesviral infection, unspecified: B00.9

## 2011-12-16 HISTORY — DX: Herpesviral vesicular dermatitis: B00.1

## 2011-12-16 MED ORDER — VALACYCLOVIR HCL 1 G PO TABS: ORAL_TABLET | ORAL | Status: DC

## 2011-12-16 NOTE — Patient Instructions (Signed)
We'll notify you of your lab results and make any changes if needed Call with any questions or concerns Use the Valtrex for the fever blister as needed You look great!  Keep up the good work! Happy Holidays!!!

## 2011-12-16 NOTE — Progress Notes (Signed)
  Subjective:    Patient ID: [name], female    DOB: [birth year], 40 y.o.   MRN: [id number]  HPI DM- chronic problem.  Has recently lost another 20 lbs.  Is on Metformin.  Some nausea.  Fasting CBGs ~110 but will drop during the day to 70-80s.  No CP, SOB, HAs, visual changes, edema.  Will occasionally have symptomatic lows.  Hyperlipidemia- chronic problem, has been taking Crestor.  Denies abd pain, myalgias.  Hypotension- having some readings of low blood pressure.  On HCTZ 12.5 mg daily for kidney stones.  Admits to decreased water intake since weight loss surgery.  Cold sores- ongoing problem for pt.  Currently has 1 blister present.   Review of Systems For ROS see HPI     Objective:   Physical Exam  Vitals reviewed. Constitutional: She is oriented to person, place, and time. She appears well-developed and well-nourished. No distress.  HENT:  Head: Normocephalic and atraumatic.  Eyes: Conjunctivae and EOM are normal. Pupils are equal, round, and reactive to light.  Neck: Normal range of motion. Neck supple. No thyromegaly present.  Cardiovascular: Normal rate, regular rhythm, normal heart sounds and intact distal pulses.   No murmur heard. Pulmonary/Chest: Effort normal and breath sounds normal. No respiratory distress.  Abdominal: Soft. She exhibits no distension. There is no tenderness.  Musculoskeletal: She exhibits no edema.  Lymphadenopathy:    She has no cervical adenopathy.  Neurological: She is alert and oriented to person, place, and time.  Skin: Skin is warm and dry.  Psychiatric: She has a normal mood and affect. Her behavior is normal.          Assessment & Plan:

## 2011-12-17 ENCOUNTER — Other Ambulatory Visit (INDEPENDENT_AMBULATORY_CARE_PROVIDER_SITE_OTHER): Payer: Self-pay | Admitting: Surgery

## 2011-12-17 ENCOUNTER — Other Ambulatory Visit: Payer: Self-pay | Admitting: Family Medicine

## 2011-12-17 LAB — HEPATIC FUNCTION PANEL
AST: 51 U/L — ABNORMAL HIGH (ref 0–37)
Albumin: 4.9 g/dL (ref 3.5–5.2)
Alkaline Phosphatase: 66 U/L (ref 39–117)
Bilirubin, Direct: 0.1 mg/dL (ref 0.0–0.3)

## 2011-12-17 LAB — BASIC METABOLIC PANEL
BUN: 26 mg/dL — ABNORMAL HIGH (ref 6–23)
CO2: 31 mEq/L (ref 19–32)
Calcium: 9.6 mg/dL (ref 8.4–10.5)
Chloride: 100 mEq/L (ref 96–112)
Creatinine, Ser: 1.7 mg/dL — ABNORMAL HIGH (ref 0.4–1.2)

## 2011-12-17 LAB — LIPID PANEL
Cholesterol: 143 mg/dL (ref 0–200)
Total CHOL/HDL Ratio: 3
Triglycerides: 136 mg/dL (ref 0.0–149.0)

## 2011-12-17 NOTE — Telephone Encounter (Signed)
Last OV 12-16-11 last refill 06-24-11 #45 no refills

## 2011-12-23 ENCOUNTER — Encounter: Payer: Self-pay | Admitting: *Deleted

## 2011-12-25 ENCOUNTER — Telehealth: Payer: Self-pay | Admitting: Family Medicine

## 2011-12-25 MED ORDER — ZOLPIDEM TARTRATE ER 12.5 MG PO TBCR: 12.5000 mg | EXTENDED_RELEASE_TABLET | Freq: Every evening | ORAL | Status: DC | PRN

## 2011-12-25 NOTE — Telephone Encounter (Signed)
.  rx faxed to pharmacy, manually. To [facility] per pt request to send there New rx of Ambien CR 12.5mg  #30 with 1 refill QHS Per verbal order from MD Tabori based on pt stating that she does want to try this medication

## 2011-12-25 NOTE — Telephone Encounter (Signed)
Last OV 12-16-11 last refill on xanax 12-01-11 #30 with 1 refill however unable to keep her asleep

## 2011-12-25 NOTE — Telephone Encounter (Signed)
Patient wants rx  To help her sleep she said xanax helps her fall asleep but she doesn't stay asleep - [facility] [address] [place]

## 2011-12-25 NOTE — Telephone Encounter (Signed)
Is she interested in switching to an extended release product like Ambien CR?

## 2011-12-25 NOTE — Telephone Encounter (Signed)
Agree w/ Ambien Cr script.

## 2011-12-29 NOTE — Assessment & Plan Note (Signed)
Chronic problem.  Tolerating statin w/out difficulty.  Check labs.  Adjust meds prn  

## 2011-12-29 NOTE — Assessment & Plan Note (Signed)
Chronic problem.  Typically well controlled on Metformin.  Asymptomatic.  Continues to lose weight.  Check labs.  Adjust meds prn

## 2011-12-29 NOTE — Assessment & Plan Note (Signed)
New.  Start Valtrex to improve sxs.

## 2011-12-29 NOTE — Assessment & Plan Note (Signed)
Pt is likely hypovolemic due to decreased water intake and use of HCTZ.  Discussed stopping HCTZ but pt is taking this based on Uro's advice- do not want to stop med w/out talking to them first.  Encouraged increased water intake and changing positions slowly.  Pt expressed understanding and is in agreement w/ plan.

## 2012-01-04 ENCOUNTER — Other Ambulatory Visit: Payer: Self-pay | Admitting: Family Medicine

## 2012-01-04 MED ORDER — ALPRAZOLAM 0.5 MG PO TABS: 0.5000 mg | ORAL_TABLET | Freq: Three times a day (TID) | ORAL | Status: DC | PRN

## 2012-01-04 NOTE — Telephone Encounter (Signed)
.  rx faxed to pharmacy, manually.  

## 2012-01-04 NOTE — Telephone Encounter (Signed)
Ok for #30 but please remind her she had a refill on last script and should not be needing this daily

## 2012-01-04 NOTE — Telephone Encounter (Signed)
Last OV 12-16-11 last refill 12-01-11 #30 with 1 refill (noted early)

## 2012-01-05 ENCOUNTER — Telehealth: Payer: Self-pay | Admitting: *Deleted

## 2012-01-05 NOTE — Telephone Encounter (Signed)
Called pt to advise her rx for xanax has been sent and that she needs to be aware this is not to be for daily use. Pt understood

## 2012-01-06 ENCOUNTER — Encounter: Payer: Self-pay | Admitting: Family

## 2012-01-06 ENCOUNTER — Telehealth: Payer: Self-pay | Admitting: *Deleted

## 2012-01-06 ENCOUNTER — Ambulatory Visit (INDEPENDENT_AMBULATORY_CARE_PROVIDER_SITE_OTHER): Payer: Managed Care, Other (non HMO) | Admitting: Family

## 2012-01-06 VITALS — BP 116/78 | HR 84 | Temp 97.9°F | Resp 16 | Wt 180.1 lb

## 2012-01-06 DIAGNOSIS — R300 Dysuria: Secondary | ICD-10-CM

## 2012-01-06 DIAGNOSIS — H01139 Eczematous dermatitis of unspecified eye, unspecified eyelid: Secondary | ICD-10-CM | POA: Insufficient documentation

## 2012-01-06 DIAGNOSIS — N289 Disorder of kidney and ureter, unspecified: Secondary | ICD-10-CM

## 2012-01-06 DIAGNOSIS — N390 Urinary tract infection, site not specified: Secondary | ICD-10-CM

## 2012-01-06 DIAGNOSIS — L259 Unspecified contact dermatitis, unspecified cause: Secondary | ICD-10-CM

## 2012-01-06 HISTORY — DX: Eczematous dermatitis of unspecified eye, unspecified eyelid: H01.139

## 2012-01-06 LAB — POCT URINALYSIS DIPSTICK
Ketones, UA: NEGATIVE
Leukocytes, UA: NEGATIVE
Nitrite, UA: NEGATIVE
Protein, UA: 300
Urobilinogen, UA: >=8
pH, UA: 8

## 2012-01-06 MED ORDER — CIPROFLOXACIN HCL 500 MG PO TABS: 500.0000 mg | ORAL_TABLET | Freq: Two times a day (BID) | ORAL | Status: AC

## 2012-01-06 NOTE — Patient Instructions (Signed)
Call if your develop worsening low back pain, if symptoms worsen, or if you are not feeling better in 2-3 days.

## 2012-01-06 NOTE — Assessment & Plan Note (Signed)
She had creatinine of 1.7 back in December. Repeat today.  If still elevated consider imaging of kidney's to exclude obstruction.

## 2012-01-06 NOTE — Progress Notes (Signed)
Subjective:    Patient ID: [name], female    DOB: 05-Sep-1971, 41 y.o.   MRN: [id number]  HPI  [name] is a 41 yr old patient who presents today with complaint of dysuria and skin irritation on her eyelids.  1) Dysuria- started 1 week ago.  Mild associated right flank pain.  She reports + hx of kidney stones but tells me that this does not feel like her previous bouts of kidney stones. Dysuria is intermittent.  Denies associated fever or hematuria. She does tell me that she had an elevation of her kidney function back in December.   2)Eye irritation- notes some itching.  Feels like it has "spread."     Review of Systems See HPI  Past Medical History  Diagnosis Date  . Hyperlipidemia   . Diabetes mellitus   . Kidney stones   . Recurrent UTI   . Non-alcoholic fatty liver disease   . Weight decrease   . Chills   . Fatigue     loss of sleep  . Dizziness   . Generalized headaches   . Wears glasses   . Blood in urine   . Liver disease   . Poor appetite   . Nausea & vomiting   . Abdominal pain   . Blurred vision     when feeling very fatigued   . Ovarian cyst     History   Social History  . Marital Status: Married    Spouse Name: N/A    Number of Children: N/A  . Years of Education: N/A   Occupational History  . Not on file.   Social History Main Topics  . Smoking status: Never Smoker   . Smokeless tobacco: Not on file  . Alcohol Use: No  . Drug Use: No  . Sexually Active:    Other Topics Concern  . Not on file   Social History Narrative  . No narrative on file    Past Surgical History  Procedure Date  . Cholecystectomy 2005  . Partial hysterectomy 2008  . Kidney stones   . Gastric bypass 10/2010  . Tubal ligation     x3  . Abdominal hysterectomy   . Cystostomy w/ bladder biopsy   . Exploratory laparotomy 09/01/11  . Laparoscopy 10/19/2011    Procedure: LAPAROSCOPY OPERATIVE;  Surgeon: Miguel Aschoff;  Location: [facility];  Service: Gynecology;   Laterality: N/A;  Operative Laparoscopy with Lysis Of Adhesions  . Laparotomy 10/19/2011    Procedure: LAPAROTOMY;  Surgeon: Miguel Aschoff;  Location: [facility];  Service: Gynecology;  Laterality: Bilateral;  Laparotomy With Bilateral Oophorectomy    Family History  Problem Relation Age of Onset  . Lung cancer Mother   . Cancer Mother     lung  . Hyperlipidemia Father   . Heart disease Father   . Stroke Father   . Hypertension Father   . Diabetes Father   . Hypertension Brother   . Other Brother     suicide    No Known Allergies  Current Outpatient Prescriptions on File Prior to Visit  Medication Sig Dispense Refill  . ALPRAZolam (XANAX) 0.5 MG tablet Take 1 tablet (0.5 mg total) by mouth 3 (three) times daily as needed for sleep.  30 tablet  0  . CHLOROTHIAZIDE PO 25 mg. Take 1/2 tablet daily.      . citalopram (CELEXA) 20 MG tablet Take 1 tablet (20 mg total) by mouth daily.  30 tablet  2  .  potassium chloride (KLOR-CON) 20 MEQ packet Take 20 mEq by mouth 2 (two) times daily.        . rosuvastatin (CRESTOR) 20 MG tablet Take 1 tablet (20 mg total) by mouth daily.  30 tablet  1  . traMADol (ULTRAM) 50 MG tablet TAKE ONE TABLET BY MOUTH EVERY 6 HOURS AS NEEDED FOR PAIN  45 tablet  1  . valACYclovir (VALTREX) 1000 MG tablet 2 tabs x1 dose.  Repeat in 12 hrs.  4 tablet  3  . zolpidem (AMBIEN CR) 12.5 MG CR tablet Take 1 tablet (12.5 mg total) by mouth at bedtime as needed for sleep.  30 tablet  1  . metFORMIN (GLUCOPHAGE) 500 MG tablet Take 1 tablet (500 mg total) by mouth 2 (two) times daily with a meal.  60 tablet  1    BP 116/78  Pulse 84  Temp(Src) 97.9 F (36.6 C) (Oral)  Resp 16  Wt 180 lb 1.3 oz (81.684 kg)       Objective:   Physical Exam  Constitutional: She appears well-developed and well-nourished. No distress.  Cardiovascular: Normal rate and regular rhythm.   No murmur heard. Pulmonary/Chest: Effort normal and breath sounds normal. No respiratory distress. She  has no wheezes. She has no rales.  Genitourinary:       Neg CVAT bilaterally  Skin: Skin is warm and dry.       Bilateral eyelids pink/dry mild scaling.  Psychiatric: She has a normal mood and affect. Her behavior is normal. Judgment and thought content normal.          Assessment & Plan:

## 2012-01-06 NOTE — Assessment & Plan Note (Signed)
UA notes trace blood. Will treat empirically and send urine for culture.

## 2012-01-06 NOTE — Assessment & Plan Note (Signed)
I recommended that she start with using a good facial moisturizer. If no improvement, consider referral to derm for recommendations on safe steroid creams for this area.

## 2012-01-06 NOTE — Telephone Encounter (Signed)
Pt called office and was transferred to triage VM needing appointment for possible UTI/Bladder infection. Nothing available at [facility] office, scheduled at [facility] w/Melissa Peggyann Juba at 4:00p 01.09.13 Patient informed.

## 2012-01-07 LAB — BASIC METABOLIC PANEL
BUN: 15 mg/dL (ref 6–23)
Calcium: 9.4 mg/dL (ref 8.4–10.5)
Creat: 1.5 mg/dL — ABNORMAL HIGH (ref 0.50–1.10)
Glucose, Bld: 106 mg/dL — ABNORMAL HIGH (ref 70–99)
Potassium: 3.5 mEq/L (ref 3.5–5.3)

## 2012-01-08 ENCOUNTER — Telehealth: Payer: Self-pay | Admitting: Family

## 2012-01-08 DIAGNOSIS — N289 Disorder of kidney and ureter, unspecified: Secondary | ICD-10-CM

## 2012-01-08 LAB — URINE CULTURE: Colony Count: NO GROWTH

## 2012-01-08 NOTE — Telephone Encounter (Signed)
Left message for pt to return my call.  When pt calls back please let her know that her kidney function remains elevated- only slightly improved from 1 month ago. Is she using NSAIDS? She should stop if she is.  I would like for her to complete a CT of the abdomen and pelvis (order pended) to make sure that she doesn't have a stone causing obstruction as possible cause for her elevated creatinine.

## 2012-01-08 NOTE — Telephone Encounter (Signed)
Notified pt, she states that she is not using any NSAIDS. Advised pt that she will be contacted next re: CT date and time and to call if she has not heard back from [place] by Tuesday. Pt wanted to know if this was the only way to check for a stone. Advised pt this was the best way to proceed and confirmed with Sandford Craze, NP.

## 2012-01-09 ENCOUNTER — Emergency Department (INDEPENDENT_AMBULATORY_CARE_PROVIDER_SITE_OTHER): Payer: Managed Care, Other (non HMO)

## 2012-01-09 ENCOUNTER — Encounter (HOSPITAL_BASED_OUTPATIENT_CLINIC_OR_DEPARTMENT_OTHER): Payer: Self-pay | Admitting: Emergency Medicine

## 2012-01-09 ENCOUNTER — Emergency Department (HOSPITAL_BASED_OUTPATIENT_CLINIC_OR_DEPARTMENT_OTHER)
Admission: EM | Admit: 2012-01-09 | Discharge: 2012-01-09 | Disposition: A | Discharge status: Home / Self Care | Payer: Managed Care, Other (non HMO) | Attending: Emergency Medicine | Admitting: Emergency Medicine

## 2012-01-09 DIAGNOSIS — E119 Type 2 diabetes mellitus without complications: Secondary | ICD-10-CM | POA: Insufficient documentation

## 2012-01-09 DIAGNOSIS — R112 Nausea with vomiting, unspecified: Secondary | ICD-10-CM | POA: Insufficient documentation

## 2012-01-09 DIAGNOSIS — E785 Hyperlipidemia, unspecified: Secondary | ICD-10-CM | POA: Insufficient documentation

## 2012-01-09 DIAGNOSIS — R109 Unspecified abdominal pain: Secondary | ICD-10-CM | POA: Insufficient documentation

## 2012-01-09 DIAGNOSIS — Z79899 Other long term (current) drug therapy: Secondary | ICD-10-CM | POA: Insufficient documentation

## 2012-01-09 DIAGNOSIS — N210 Calculus in bladder: Secondary | ICD-10-CM

## 2012-01-09 LAB — URINALYSIS, ROUTINE W REFLEX MICROSCOPIC
Bilirubin Urine: NEGATIVE
Glucose, UA: NEGATIVE mg/dL
Ketones, ur: NEGATIVE mg/dL
Protein, ur: 100 mg/dL — AB
Urobilinogen, UA: 2 mg/dL — ABNORMAL HIGH (ref 0.0–1.0)

## 2012-01-09 LAB — BASIC METABOLIC PANEL
BUN: 22 mg/dL (ref 6–23)
Calcium: 9.7 mg/dL (ref 8.4–10.5)
GFR calc Af Amer: 59 mL/min — ABNORMAL LOW (ref >90)
GFR calc non Af Amer: 51 mL/min — ABNORMAL LOW (ref >90)
Glucose, Bld: 104 mg/dL — ABNORMAL HIGH (ref 70–99)
Potassium: 3.2 mEq/L — ABNORMAL LOW (ref 3.5–5.1)
Sodium: 140 mEq/L (ref 135–145)

## 2012-01-09 LAB — URINE CULTURE

## 2012-01-09 LAB — URINE MICROSCOPIC-ADD ON

## 2012-01-09 LAB — HEPATIC FUNCTION PANEL
Albumin: 4.3 g/dL (ref 3.5–5.2)
Alkaline Phosphatase: 72 U/L (ref 39–117)
Indirect Bilirubin: 0.4 mg/dL (ref 0.3–0.9)
Total Bilirubin: 0.5 mg/dL (ref 0.3–1.2)

## 2012-01-09 MED ORDER — HYDROMORPHONE HCL PF 1 MG/ML IJ SOLN
1.0000 mg | Freq: Once | INTRAMUSCULAR | Status: AC
  Administered 2012-01-09: 1 mg via INTRAVENOUS
  Filled 2012-01-09: qty 1

## 2012-01-09 MED ORDER — SODIUM CHLORIDE 0.9 % IV SOLN
Freq: Once | INTRAVENOUS | Status: AC
  Administered 2012-01-09: 14:00:00 via INTRAVENOUS

## 2012-01-09 MED ORDER — MORPHINE SULFATE 4 MG/ML IJ SOLN
4.0000 mg | Freq: Once | INTRAMUSCULAR | Status: AC
  Administered 2012-01-09: 4 mg via INTRAVENOUS
  Filled 2012-01-09: qty 1

## 2012-01-09 MED ORDER — ONDANSETRON HCL 4 MG/2ML IJ SOLN
4.0000 mg | Freq: Once | INTRAMUSCULAR | Status: AC
Start: 2012-01-09 — End: 2012-01-09
  Administered 2012-01-09: 4 mg via INTRAVENOUS
  Filled 2012-01-09: qty 2

## 2012-01-09 MED ORDER — OXYCODONE-ACETAMINOPHEN 5-325 MG PO TABS: 2.0000 | ORAL_TABLET | ORAL | Status: AC | PRN

## 2012-01-09 MED ORDER — OXYCODONE-ACETAMINOPHEN 5-325 MG PO TABS
1.0000 | ORAL_TABLET | Freq: Once | ORAL | Status: AC
  Administered 2012-01-09: 1 via ORAL
  Filled 2012-01-09: qty 1

## 2012-01-09 NOTE — ED Provider Notes (Signed)
History     CSN: [id number]  Arrival date & time 01/09/12  1158   First MD Initiated Contact with Patient 01/09/12 1317      Chief Complaint  Patient presents with  . Flank Pain    (Consider location/radiation/quality/duration/timing/severity/associated sxs/prior treatment) HPI Comments: Pt state that she is having right flank pain:pt states that she has a history of stones and this feels similar  Patient is a 41 y.o. female presenting with flank pain. The history is provided by the patient. No language interpreter was used.  Flank Pain This is a recurrent problem. The current episode started in the past 7 days. The problem occurs constantly. The problem has been unchanged. Associated symptoms include nausea. Pertinent negatives include no abdominal pain, fever or vomiting. The symptoms are aggravated by nothing. She has tried nothing for the symptoms.    Past Medical History  Diagnosis Date  . Hyperlipidemia   . Diabetes mellitus   . Kidney stones   . Recurrent UTI   . Non-alcoholic fatty liver disease   . Weight decrease   . Chills   . Fatigue     loss of sleep  . Dizziness   . Generalized headaches   . Wears glasses   . Blood in urine   . Liver disease   . Poor appetite   . Nausea & vomiting   . Abdominal pain   . Blurred vision     when feeling very fatigued   . Ovarian cyst     Past Surgical History  Procedure Date  . Cholecystectomy 2005  . Partial hysterectomy 2008  . Kidney stones   . Gastric bypass 10/2010  . Tubal ligation     x3  . Abdominal hysterectomy   . Cystostomy w/ bladder biopsy   . Exploratory laparotomy 09/01/11  . Laparoscopy 10/19/2011    Procedure: LAPAROSCOPY OPERATIVE;  Surgeon: Miguel Aschoff;  Location: [facility];  Service: Gynecology;  Laterality: N/A;  Operative Laparoscopy with Lysis Of Adhesions  . Laparotomy 10/19/2011    Procedure: LAPAROTOMY;  Surgeon: Miguel Aschoff;  Location: [facility];  Service: Gynecology;  Laterality: Bilateral;   Laparotomy With Bilateral Oophorectomy    Family History  Problem Relation Age of Onset  . Lung cancer Mother   . Cancer Mother     lung  . Hyperlipidemia Father   . Heart disease Father   . Stroke Father   . Hypertension Father   . Diabetes Father   . Hypertension Brother   . Other Brother     suicide    History  Substance Use Topics  . Smoking status: Never Smoker   . Smokeless tobacco: Not on file  . Alcohol Use: No    OB History    Grav Para Term Preterm Abortions TAB SAB Ect Mult Living                  Review of Systems  Constitutional: Negative for fever.  Gastrointestinal: Positive for nausea. Negative for vomiting and abdominal pain.  Genitourinary: Positive for flank pain.  All other systems reviewed and are negative.    Allergies  Review of patient's allergies indicates no known allergies.  Home Medications   Current Outpatient Rx  Name Route Sig Dispense Refill  . ALPRAZOLAM 0.5 MG PO TABS Oral Take 1 tablet (0.5 mg total) by mouth 3 (three) times daily as needed for sleep. 30 tablet 0  . CHLOROTHIAZIDE PO  25 mg. Take 1/2 tablet daily.    Marland Kitchen  CIPROFLOXACIN HCL 500 MG PO TABS Oral Take 1 tablet (500 mg total) by mouth 2 (two) times daily. 14 tablet 0  . CITALOPRAM HYDROBROMIDE 20 MG PO TABS Oral Take 1 tablet (20 mg total) by mouth daily. 30 tablet 2  . METFORMIN HCL 500 MG PO TABS Oral Take 1 tablet (500 mg total) by mouth 2 (two) times daily with a meal. 60 tablet 1  . POTASSIUM CHLORIDE 20 MEQ PO PACK Oral Take 20 mEq by mouth 2 (two) times daily.      Marland Kitchen ROSUVASTATIN CALCIUM 20 MG PO TABS Oral Take 1 tablet (20 mg total) by mouth daily. 30 tablet 1  . TRAMADOL HCL 50 MG PO TABS  TAKE ONE TABLET BY MOUTH EVERY 6 HOURS AS NEEDED FOR PAIN 45 tablet 1  . VALACYCLOVIR HCL 1 G PO TABS  2 tabs x1 dose.  Repeat in 12 hrs. 4 tablet 3  . ZOLPIDEM TARTRATE ER 12.5 MG PO TBCR Oral Take 1 tablet (12.5 mg total) by mouth at bedtime as needed for sleep. 30 tablet  1    BP 114/70  Pulse 80  Temp(Src) 98.8 F (37.1 C) (Oral)  Resp 16  Ht 5\' 2"  (1.575 m)  Wt 180 lb (81.647 kg)  BMI 32.92 kg/m2  SpO2 100%  Physical Exam  Nursing note and vitals reviewed. Constitutional: She is oriented to person, place, and time. She appears well-developed and well-nourished.  HENT:  Head: Normocephalic and atraumatic.  Eyes: EOM are normal.  Cardiovascular: Normal rate and regular rhythm.   Pulmonary/Chest: Effort normal and breath sounds normal.  Abdominal: Soft. Bowel sounds are normal. There is no tenderness. There is no CVA tenderness.  Musculoskeletal: Normal range of motion.  Neurological: She is alert and oriented to person, place, and time.  Skin: Skin is warm and dry.    ED Course  Procedures (including critical care time)  Labs Reviewed  URINALYSIS, ROUTINE W REFLEX MICROSCOPIC - Abnormal; Notable for the following:    APPearance CLOUDY (*)    Hgb urine dipstick LARGE (*)    Protein, ur 100 (*)    Urobilinogen, UA 2.0 (*)    Leukocytes, UA TRACE (*)    All other components within normal limits  URINE MICROSCOPIC-ADD ON - Abnormal; Notable for the following:    Squamous Epithelial / LPF MANY (*)    Bacteria, UA MANY (*)    All other components within normal limits  BASIC METABOLIC PANEL - Abnormal; Notable for the following:    Potassium 3.2 (*)    Glucose, Bld 104 (*)    Creatinine, Ser 1.30 (*)    GFR calc non Af Amer 51 (*)    GFR calc Af Amer 59 (*)    All other components within normal limits  LIPASE, BLOOD - Abnormal; Notable for the following:    Lipase 61 (*)    All other components within normal limits  HEPATIC FUNCTION PANEL - Abnormal; Notable for the following:    AST 45 (*)    All other components within normal limits   Ct Abdomen Pelvis Wo Contrast  01/09/2012  *RADIOLOGY REPORT*  Clinical Data: Right flank pain for 3 days.  Status post cholecystectomy, gastric bypass.  CT ABDOMEN AND PELVIS WITHOUT CONTRAST   Technique:  Multidetector CT imaging of the abdomen and pelvis was performed following the standard protocol without intravenous contrast.  Comparison: Acute abdomen series 11/23/2011.  Most recent abdominal pelvic CT of 07/24/2011.  Findings: Normal heart  size without pericardial or pleural effusion.  Minimal exclusion of the liver.  Irregular hepatic contour is again identified, most apparent inferior right hepatic lobe.  A sub centimeter right hepatic lobe low density lesion on image 35 is unchanged and likely a cyst.  Mild splenomegaly, 15.5 cm cranial caudal.  Status post gastric bypass.  The efferent loop is antecolic, with the jejunal jejunal anastomoses identified in the left side of the abdomen on image 33. There is no evidence of complicating obstruction.  Cholecystectomy without biliary ductal dilatation.  The pancreatic head and uncinate process are somewhat ill-defined with surrounding subtle edema, including on image 26.  Normal adrenal glands.  No renal calculi or hydronephrosis.  No significant hydroureter is identified.  A calcification is identified at the left bladder base, possibly at the ureter vesicular junction.  This measures 4 mm on image 80.  Prominent azygos vein identified within the retrocrural space.  Colonic stool burden suggests constipation.  Normal terminal ileum and appendix.  Normal small bowel without abdominal ascites.    No pelvic adenopathy.  Hysterectomy.  No adnexal mass or significant free pelvic fluid. No acute osseous abnormality.  IMPRESSION:  1.  Left sided bladder base calculus.  No significant hydroureter. 2.  Apparent ill-definition of the pancreatic head, uncinate process.  Cannot exclude focal pancreatitis.    Consider correlation with pancreatic enzymes. 3.  Subtle findings which are again suspicious for cirrhosis. 4.  Status post gastric bypass, without acute complication identified. 5. Possible constipation.  Original Report Authenticated By: Consuello Bossier,  M.D.     1. Flank pain       MDM  Pt abdomen non tender:not consistent with pancreatitis:pt is okay to follow up:no definite infection in urine:pt notified of cirrhosis findings        Teressa Lower, NP 01/09/12 1557  Teressa Lower, NP 01/09/12 1558

## 2012-01-09 NOTE — ED Notes (Signed)
Pt c/o RT flank pain since Thurs, worse now

## 2012-01-10 NOTE — ED Provider Notes (Signed)
Medical screening examination/treatment/procedure(s) were performed by non-physician practitioner and as supervising physician I was immediately available for consultation/collaboration.  Ethelda Chick, MD 01/10/12 [phone]

## 2012-01-11 ENCOUNTER — Telehealth: Payer: Self-pay | Admitting: Family

## 2012-01-11 NOTE — Telephone Encounter (Signed)
Notified pt. 

## 2012-01-11 NOTE — Telephone Encounter (Signed)
Pls call pt and let her know that I have reviewed her ED records.  She should have follow up with Dr. Beverely Low early this week.

## 2012-01-14 ENCOUNTER — Ambulatory Visit (INDEPENDENT_AMBULATORY_CARE_PROVIDER_SITE_OTHER): Payer: Managed Care, Other (non HMO) | Admitting: Family Medicine

## 2012-01-14 ENCOUNTER — Encounter: Payer: Self-pay | Admitting: Family Medicine

## 2012-01-14 DIAGNOSIS — K7689 Other specified diseases of liver: Secondary | ICD-10-CM

## 2012-01-14 DIAGNOSIS — E876 Hypokalemia: Secondary | ICD-10-CM

## 2012-01-14 DIAGNOSIS — R300 Dysuria: Secondary | ICD-10-CM

## 2012-01-14 DIAGNOSIS — K760 Fatty (change of) liver, not elsewhere classified: Secondary | ICD-10-CM

## 2012-01-14 DIAGNOSIS — N289 Disorder of kidney and ureter, unspecified: Secondary | ICD-10-CM

## 2012-01-14 NOTE — Progress Notes (Signed)
  Subjective:    Patient ID: [name], female    DOB: [birth year], 41 y.o.   MRN: [id number]  HPI Kidney stone- went to ER on 1/12.  Had stone in bladder.  On CT scan showed bladder stone, unchanged liver cyst, ? Pancreatitis, constipation.  Labs showed low K+.  Still having intermittent back pain.  Has not seen stone pass.   Review of Systems For ROS see HPI     Objective:   Physical Exam  Vitals reviewed. Constitutional: She appears well-developed and well-nourished. No distress.  Cardiovascular: Normal rate, regular rhythm and normal heart sounds.   Pulmonary/Chest: Effort normal and breath sounds normal. No respiratory distress. She has no wheezes. She has no rales.  Abdominal: Soft. Bowel sounds are normal. She exhibits no distension and no mass. There is no tenderness. There is no rebound and no guarding.          Assessment & Plan:

## 2012-01-14 NOTE — Patient Instructions (Signed)
Follow up as scheduled We'll notify you of your lab results and make any changes if needed Call with any questions or concerns Enjoy the snow!!!

## 2012-01-15 ENCOUNTER — Encounter: Payer: Self-pay | Admitting: *Deleted

## 2012-01-15 LAB — HEPATIC FUNCTION PANEL
ALT: 42 U/L — ABNORMAL HIGH (ref 0–35)
AST: 59 U/L — ABNORMAL HIGH (ref 0–37)
Albumin: 4.4 g/dL (ref 3.5–5.2)
Total Protein: 7 g/dL (ref 6.0–8.3)

## 2012-01-15 LAB — BASIC METABOLIC PANEL
BUN: 15 mg/dL (ref 6–23)
CO2: 29 mEq/L (ref 19–32)
Chloride: 95 mEq/L — ABNORMAL LOW (ref 96–112)
Creatinine, Ser: 1.3 mg/dL — ABNORMAL HIGH (ref 0.4–1.2)
Glucose, Bld: 110 mg/dL — ABNORMAL HIGH (ref 70–99)
Potassium: 3.2 mEq/L — ABNORMAL LOW (ref 3.5–5.1)

## 2012-01-15 MED ORDER — POTASSIUM CHLORIDE CRYS ER 20 MEQ PO TBCR: 20.0000 meq | EXTENDED_RELEASE_TABLET | Freq: Every day | ORAL | Status: DC

## 2012-01-24 NOTE — Assessment & Plan Note (Signed)
Stable per CT report.  Currently asymptomatic.  Has seen liver specialist.  Check LFTs.

## 2012-01-24 NOTE — Assessment & Plan Note (Signed)
Pt had abnormal K+ in ER.  Check labs to assess for improvement or start supplement.

## 2012-01-24 NOTE — Assessment & Plan Note (Signed)
Pt periodically w/ elevated Cr.  Check labs.  Encouraged fluids.  Follow closely and refer to renal PRN.  Pt expressed understanding and is in agreement w/ plan.

## 2012-01-24 NOTE — Assessment & Plan Note (Signed)
Pt had CT that showed bladder stone.  Has not seen stone pass but dysuria has improved.  Encouraged increased fluid intake.  Will continue to follow.

## 2012-01-25 ENCOUNTER — Other Ambulatory Visit: Payer: Managed Care, Other (non HMO)

## 2012-02-12 ENCOUNTER — Emergency Department (HOSPITAL_BASED_OUTPATIENT_CLINIC_OR_DEPARTMENT_OTHER)
Admission: EM | Admit: 2012-02-12 | Discharge: 2012-02-12 | Disposition: A | Discharge status: Home / Self Care | Payer: Managed Care, Other (non HMO) | Attending: Emergency Medicine | Admitting: Emergency Medicine

## 2012-02-12 ENCOUNTER — Encounter (HOSPITAL_BASED_OUTPATIENT_CLINIC_OR_DEPARTMENT_OTHER): Payer: Self-pay | Admitting: *Deleted

## 2012-02-12 ENCOUNTER — Other Ambulatory Visit: Payer: Self-pay

## 2012-02-12 ENCOUNTER — Emergency Department (INDEPENDENT_AMBULATORY_CARE_PROVIDER_SITE_OTHER): Payer: Managed Care, Other (non HMO)

## 2012-02-12 DIAGNOSIS — F411 Generalized anxiety disorder: Secondary | ICD-10-CM | POA: Insufficient documentation

## 2012-02-12 DIAGNOSIS — F419 Anxiety disorder, unspecified: Secondary | ICD-10-CM

## 2012-02-12 DIAGNOSIS — E785 Hyperlipidemia, unspecified: Secondary | ICD-10-CM | POA: Insufficient documentation

## 2012-02-12 DIAGNOSIS — R29898 Other symptoms and signs involving the musculoskeletal system: Secondary | ICD-10-CM

## 2012-02-12 DIAGNOSIS — E119 Type 2 diabetes mellitus without complications: Secondary | ICD-10-CM | POA: Insufficient documentation

## 2012-02-12 DIAGNOSIS — R42 Dizziness and giddiness: Secondary | ICD-10-CM | POA: Insufficient documentation

## 2012-02-12 DIAGNOSIS — R079 Chest pain, unspecified: Secondary | ICD-10-CM

## 2012-02-12 LAB — COMPREHENSIVE METABOLIC PANEL
ALT: 33 U/L (ref 0–35)
Albumin: 4.2 g/dL (ref 3.5–5.2)
BUN: 18 mg/dL (ref 6–23)
Calcium: 9.6 mg/dL (ref 8.4–10.5)
GFR calc Af Amer: 81 mL/min — ABNORMAL LOW (ref >90)
Glucose, Bld: 107 mg/dL — ABNORMAL HIGH (ref 70–99)
Sodium: 139 mEq/L (ref 135–145)
Total Protein: 6.9 g/dL (ref 6.0–8.3)

## 2012-02-12 LAB — CBC
HCT: 37.6 % (ref 36.0–46.0)
MCHC: 34.6 g/dL (ref 30.0–36.0)
Platelets: 83 10*3/uL — ABNORMAL LOW (ref 150–400)
RDW: 12 % (ref 11.5–15.5)
WBC: 4.8 10*3/uL (ref 4.0–10.5)

## 2012-02-12 LAB — URINALYSIS, ROUTINE W REFLEX MICROSCOPIC
Hgb urine dipstick: NEGATIVE
Nitrite: NEGATIVE
Protein, ur: NEGATIVE mg/dL
Urobilinogen, UA: 1 mg/dL (ref 0.0–1.0)

## 2012-02-12 LAB — LIPASE, BLOOD: Lipase: 58 U/L (ref 11–59)

## 2012-02-12 LAB — PRO B NATRIURETIC PEPTIDE: Pro B Natriuretic peptide (BNP): 62.1 pg/mL (ref 0–125)

## 2012-02-12 MED ORDER — OXYCODONE-ACETAMINOPHEN 5-325 MG PO TABS
2.0000 | ORAL_TABLET | Freq: Once | ORAL | Status: AC
  Administered 2012-02-12: 2 via ORAL
  Filled 2012-02-12: qty 2

## 2012-02-12 MED ORDER — LORAZEPAM 1 MG PO TABS
1.0000 mg | ORAL_TABLET | Freq: Once | ORAL | Status: AC
  Administered 2012-02-12: 1 mg via ORAL
  Filled 2012-02-12: qty 1

## 2012-02-12 MED ORDER — MORPHINE SULFATE 4 MG/ML IJ SOLN
4.0000 mg | Freq: Once | INTRAMUSCULAR | Status: AC
  Administered 2012-02-12: 4 mg via INTRAVENOUS
  Filled 2012-02-12: qty 1

## 2012-02-12 MED ORDER — LORAZEPAM 1 MG PO TABS: 1.0000 mg | ORAL_TABLET | Freq: Three times a day (TID) | ORAL | Status: AC | PRN

## 2012-02-12 MED ORDER — ASPIRIN 81 MG PO CHEW
324.0000 mg | CHEWABLE_TABLET | Freq: Once | ORAL | Status: AC
  Administered 2012-02-12: 324 mg via ORAL
  Filled 2012-02-12: qty 4

## 2012-02-12 NOTE — ED Notes (Signed)
Woke during the night with back pain radiating into her right upper abdominal quadrant. Hx of gallbladder surgery in 2004. The pain never went away. After she got to work this am she had an episode of pain in her upper abdomen with radiation into her right chest into her neck. Rates pain 5/10 describes pain as a warm aching feeling.

## 2012-02-12 NOTE — ED Provider Notes (Signed)
History     CSN: [id number]  Arrival date & time 02/12/12  1119   First MD Initiated Contact with Patient 02/12/12 1146      Chief Complaint  Patient presents with  . Chest Pain    (Consider location/radiation/quality/duration/timing/severity/associated sxs/prior treatment) HPI Patient is a 41 year old female who presents today complaining of right-sided chest pain that began while she was sitting still at a computer. She says that this is a 3/10 currently. At its worst the pain was a 5/10. Patient was at work when it began. Patient denies any stressful situations preceding this. Patient endorses associated shortness of breath and worsening with deep breathing. She has no history of thromboembolic disease. Patient has no history of coronary artery disease or hypertension. She's not a smoker or a diabetic. She does have a history of hypercholesterolemia and family history of a father with coronary artery disease before the age of 57. Patient denies recent fevers, coughs, or night sweats. There are no other associated or modifying factors. Past Medical History  Diagnosis Date  . Hyperlipidemia   . Diabetes mellitus   . Kidney stones   . Recurrent UTI   . Non-alcoholic fatty liver disease   . Weight decrease   . Chills   . Fatigue     loss of sleep  . Dizziness   . Generalized headaches   . Wears glasses   . Blood in urine   . Liver disease   . Poor appetite   . Nausea & vomiting   . Abdominal pain   . Blurred vision     when feeling very fatigued   . Ovarian cyst     Past Surgical History  Procedure Date  . Cholecystectomy 2005  . Partial hysterectomy 2008  . Kidney stones   . Gastric bypass 10/2010  . Tubal ligation     x3  . Abdominal hysterectomy   . Cystostomy w/ bladder biopsy   . Exploratory laparotomy 09/01/11  . Laparoscopy 10/19/2011    Procedure: LAPAROSCOPY OPERATIVE;  Surgeon: Miguel Aschoff;  Location: [facility];  Service: Gynecology;  Laterality: N/A;   Operative Laparoscopy with Lysis Of Adhesions  . Laparotomy 10/19/2011    Procedure: LAPAROTOMY;  Surgeon: Miguel Aschoff;  Location: [facility];  Service: Gynecology;  Laterality: Bilateral;  Laparotomy With Bilateral Oophorectomy    Family History  Problem Relation Age of Onset  . Lung cancer Mother   . Cancer Mother     lung  . Hyperlipidemia Father   . Heart disease Father   . Stroke Father   . Hypertension Father   . Diabetes Father   . Hypertension Brother   . Other Brother     suicide    History  Substance Use Topics  . Smoking status: Never Smoker   . Smokeless tobacco: Not on file  . Alcohol Use: No    OB History    Grav Para Term Preterm Abortions TAB SAB Ect Mult Living                  Review of Systems  Constitutional: Negative.   HENT: Negative.   Eyes: Negative.   Respiratory: Negative.   Cardiovascular: Positive for chest pain.  Gastrointestinal: Negative.   Genitourinary: Negative.   Musculoskeletal: Negative.   Skin: Negative.   Neurological: Negative.   Hematological: Negative.   Psychiatric/Behavioral: Negative.   All other systems reviewed and are negative.    Allergies  Review of patient's allergies indicates no known allergies.  Home Medications   Current Outpatient Rx  Name Route Sig Dispense Refill  . ALPRAZOLAM 0.5 MG PO TABS Oral Take 1 tablet (0.5 mg total) by mouth 3 (three) times daily as needed for sleep. 30 tablet 0  . CHLOROTHIAZIDE PO  25 mg. Take 1/2 tablet daily.    Marland Kitchen CITALOPRAM HYDROBROMIDE 20 MG PO TABS Oral Take 1 tablet (20 mg total) by mouth daily. 30 tablet 2  . LORAZEPAM 1 MG PO TABS Oral Take 1 tablet (1 mg total) by mouth every 8 (eight) hours as needed for anxiety. 10 tablet 0  . POTASSIUM CHLORIDE CRYS ER 20 MEQ PO TBCR Oral Take 1 tablet (20 mEq total) by mouth daily. 30 tablet 3  . POTASSIUM CHLORIDE CRYS ER 20 MEQ PO TBCR Oral Take 1 tablet (20 mEq total) by mouth daily. 30 tablet 3  . ROSUVASTATIN CALCIUM 20 MG  PO TABS Oral Take 1 tablet (20 mg total) by mouth daily. 30 tablet 1  . TRAMADOL HCL 50 MG PO TABS  TAKE ONE TABLET BY MOUTH EVERY 6 HOURS AS NEEDED FOR PAIN 45 tablet 1  . VALACYCLOVIR HCL 1 G PO TABS  2 tabs x1 dose.  Repeat in 12 hrs. 4 tablet 3    BP 110/63  Pulse 74  Temp(Src) 98 F (36.7 C) (Oral)  Resp 14  SpO2 96%  Physical Exam  Nursing note and vitals reviewed. GEN: Well-developed, well-nourished female in no distress HEENT: Atraumatic, normocephalic. Oropharynx clear without erythema EYES: PERRLA BL, no scleral icterus. NECK: Trachea midline, no meningismus CV: regular rate and rhythm. No murmurs, rubs, or gallops PULM: No respiratory distress.  No crackles, wheezes, or rales. GI: soft, non-tender. No guarding, rebound, or tenderness. + bowel sounds  Neuro: cranial nerves 2-12 intact, no abnormalities of strength or sensation, A and O x 3 MSK: Patient moves all 4 extremities symmetrically, no deformity, edema, or injury noted Psych: no abnormality of mood   ED Course  Procedures (including critical care time)   Date: 02/12/2012  Rate: 82  Rhythm: normal sinus rhythm  QRS Axis: normal  Intervals: normal  ST/T Wave abnormalities: normal  Conduction Disutrbances: none  Narrative Interpretation:   Old EKG Reviewed: No significant changes noted    Labs Reviewed  CBC - Abnormal; Notable for the following:    Platelets 83 (*)    All other components within normal limits  URINALYSIS, ROUTINE W REFLEX MICROSCOPIC - Abnormal; Notable for the following:    APPearance CLOUDY (*)    Glucose, UA 100 (*)    All other components within normal limits  COMPREHENSIVE METABOLIC PANEL - Abnormal; Notable for the following:    Potassium 3.4 (*)    Glucose, Bld 107 (*)    AST 48 (*)    GFR calc non Af Amer 70 (*)    GFR calc Af Amer 81 (*)    All other components within normal limits  PRO B NATRIURETIC PEPTIDE  LIPASE, BLOOD  TROPONIN I  D-DIMER, QUANTITATIVE  TROPONIN  I  TROPONIN I   Dg Chest 2 View  02/12/2012  *RADIOLOGY REPORT*  Clinical Data: Right-sided chest pain, right arm weakness  CHEST - 2 VIEW  Comparison: Chest x-ray of 07/23/2010  Findings: No active infiltrate or effusion is seen.  Mediastinal contours are stable.  The heart is within normal limits in size. No bony abnormality is noted.  IMPRESSION: Stable chest x-ray.  No active lung disease.  Original Report Authenticated By: Renae Fickle  D. Gery Pray, M.D.     1. Chest pain   2. Anxiety       MDM  Patient was evaluated by myself. His evaluation she had workup for possible ACS versus thromboembolic disease as a cause of her symptoms. Patient had negative workup with normal CBC, renal panel, troponin, d-dimer, EKG, and chest x-ray. A three-hour enzyme was also sent. On further discussion with the patient she did admit to being somewhat anxious and having a stressful environment at work. She was given 1 mg of Ativan by mouth. Patient's pain was treated here with one dose of morphine as well as 2 tabs of Percocet. Patient was feeling better after this. She was felt safe for discharge and was discharged home with a prescription for 10 tabs of Ativan. She was advised to followup with her primary care physician. I did tell her that I thought speaking with her primary care physician regarding the possibility of stress testing was reasonable given her father's history of coronary artery disease. Patient was discharged home in good condition.        Cyndra Numbers, MD 02/12/12 [phone]

## 2012-02-15 ENCOUNTER — Other Ambulatory Visit (INDEPENDENT_AMBULATORY_CARE_PROVIDER_SITE_OTHER): Payer: Managed Care, Other (non HMO)

## 2012-02-15 ENCOUNTER — Other Ambulatory Visit: Payer: Managed Care, Other (non HMO)

## 2012-02-15 ENCOUNTER — Other Ambulatory Visit: Payer: Self-pay | Admitting: Family Medicine

## 2012-02-15 DIAGNOSIS — R945 Abnormal results of liver function studies: Secondary | ICD-10-CM

## 2012-02-15 DIAGNOSIS — R7989 Other specified abnormal findings of blood chemistry: Secondary | ICD-10-CM

## 2012-02-15 LAB — BASIC METABOLIC PANEL
BUN: 17 mg/dL (ref 6–23)
CO2: 32 mEq/L (ref 19–32)
Chloride: 101 mEq/L (ref 96–112)
Glucose, Bld: 127 mg/dL — ABNORMAL HIGH (ref 70–99)
Potassium: 3.3 mEq/L — ABNORMAL LOW (ref 3.5–5.1)

## 2012-02-15 LAB — HEPATIC FUNCTION PANEL
ALT: 36 U/L — ABNORMAL HIGH (ref 0–35)
AST: 50 U/L — ABNORMAL HIGH (ref 0–37)
Albumin: 4.1 g/dL (ref 3.5–5.2)

## 2012-02-15 MED ORDER — ZOLPIDEM TARTRATE ER 12.5 MG PO TBCR: 12.5000 mg | EXTENDED_RELEASE_TABLET | Freq: Every evening | ORAL | Status: DC | PRN

## 2012-02-15 NOTE — Telephone Encounter (Signed)
.  rx faxed to pharmacy, manually.  

## 2012-02-15 NOTE — Telephone Encounter (Signed)
Last OV 01-14-12 last refill 02-24-11 #30 with 1 refill

## 2012-02-15 NOTE — Telephone Encounter (Signed)
Ok for #30, 1 refill 

## 2012-02-16 ENCOUNTER — Telehealth: Payer: Self-pay | Admitting: Family Medicine

## 2012-02-16 NOTE — Telephone Encounter (Signed)
Ok to increase to Celexa 40mg  daily.  Can double what she has and then take 1 tab daily when she picks up the new script.  #30, 3 refills.

## 2012-02-16 NOTE — Telephone Encounter (Signed)
[facility] Triage Call Report Triage Record Num: [id number] Operator: Jeraldine Loots Patient Name: [name] Call Date & Time: 02/16/2012 9:48:10AM Patient Phone: [phone] PCP: Lezlie Octave Patient Gender: Female PCP Fax : [phone] Patient DOB: 1971/05/05 Practice Name: [facility] Reason for Call: Caller: Tamra/Patient; PCP: Sheliah Hatch.; CB#: [phone]; States that she is on Celexa 20mg . Has been on same since July, 2012. States that it isn't helping like it was and wanting to know if it can be increased. She was in the office last week. Having panic attacks still and an overwhelming feeling at times. She is at work today, functioning. Uses [facility] on [address]. Protocol(s) Used: Office Note Recommended Outcome per Protocol: Information Noted and Sent to Office Reason for Outcome: Caller information to office Care Advice: ~

## 2012-02-17 MED ORDER — CITALOPRAM HYDROBROMIDE 40 MG PO TABS: 40.0000 mg | ORAL_TABLET | Freq: Every day | ORAL | Status: DC

## 2012-02-17 NOTE — Telephone Encounter (Signed)
Discuss with patient, Rx sent. 

## 2012-02-22 ENCOUNTER — Telehealth: Payer: Self-pay | Admitting: Family Medicine

## 2012-02-22 NOTE — Telephone Encounter (Signed)
Refill: Alprazolam .5mg  tablet. Take 1 tablet by mouth three times daily as needed for anxiety and for sleep. Qty 30. Last fill 01-06-12

## 2012-02-22 NOTE — Telephone Encounter (Signed)
Last OV 01-14-12 last refill 01-04-12 #30 no refills

## 2012-02-22 NOTE — Telephone Encounter (Signed)
Ok for #30, 1 refill 

## 2012-02-23 MED ORDER — ALPRAZOLAM 0.5 MG PO TABS: 0.5000 mg | ORAL_TABLET | Freq: Three times a day (TID) | ORAL | Status: DC | PRN

## 2012-02-23 NOTE — Telephone Encounter (Signed)
.  rx faxed to pharmacy, manually. To [facility] [address] per incoming fax from this pharmacy, pt has 3 pharmacies noted in chart

## 2012-03-06 ENCOUNTER — Encounter (HOSPITAL_BASED_OUTPATIENT_CLINIC_OR_DEPARTMENT_OTHER): Payer: Self-pay | Admitting: Emergency Medicine

## 2012-03-06 ENCOUNTER — Emergency Department (HOSPITAL_BASED_OUTPATIENT_CLINIC_OR_DEPARTMENT_OTHER)
Admission: EM | Admit: 2012-03-06 | Discharge: 2012-03-06 | Disposition: A | Discharge status: Home / Self Care | Payer: Managed Care, Other (non HMO) | Attending: Emergency Medicine | Admitting: Emergency Medicine

## 2012-03-06 ENCOUNTER — Emergency Department (INDEPENDENT_AMBULATORY_CARE_PROVIDER_SITE_OTHER): Payer: Managed Care, Other (non HMO)

## 2012-03-06 DIAGNOSIS — R112 Nausea with vomiting, unspecified: Secondary | ICD-10-CM | POA: Insufficient documentation

## 2012-03-06 DIAGNOSIS — R109 Unspecified abdominal pain: Secondary | ICD-10-CM

## 2012-03-06 DIAGNOSIS — Z79899 Other long term (current) drug therapy: Secondary | ICD-10-CM | POA: Insufficient documentation

## 2012-03-06 DIAGNOSIS — E119 Type 2 diabetes mellitus without complications: Secondary | ICD-10-CM | POA: Insufficient documentation

## 2012-03-06 DIAGNOSIS — E785 Hyperlipidemia, unspecified: Secondary | ICD-10-CM | POA: Insufficient documentation

## 2012-03-06 LAB — CBC
HCT: 37.7 % (ref 36.0–46.0)
MCH: 29.6 pg (ref 26.0–34.0)
MCHC: 34.2 g/dL (ref 30.0–36.0)
RDW: 12.1 % (ref 11.5–15.5)

## 2012-03-06 LAB — URINALYSIS, ROUTINE W REFLEX MICROSCOPIC
Ketones, ur: NEGATIVE mg/dL
Leukocytes, UA: NEGATIVE
Nitrite: NEGATIVE
Protein, ur: NEGATIVE mg/dL

## 2012-03-06 LAB — URINE MICROSCOPIC-ADD ON

## 2012-03-06 LAB — BASIC METABOLIC PANEL WITH GFR
BUN: 16 mg/dL (ref 6–23)
CO2: 32 meq/L (ref 19–32)
Calcium: 9.4 mg/dL (ref 8.4–10.5)
Chloride: 101 meq/L (ref 96–112)
Creatinine, Ser: 1 mg/dL (ref 0.50–1.10)
GFR calc Af Amer: 81 mL/min — ABNORMAL LOW
GFR calc non Af Amer: 70 mL/min — ABNORMAL LOW
Glucose, Bld: 106 mg/dL — ABNORMAL HIGH (ref 70–99)
Potassium: 3.3 meq/L — ABNORMAL LOW (ref 3.5–5.1)
Sodium: 140 meq/L (ref 135–145)

## 2012-03-06 LAB — DIFFERENTIAL
Basophils Absolute: 0 10*3/uL (ref 0.0–0.1)
Basophils Relative: 1 % (ref 0–1)
Eosinophils Absolute: 0.2 10*3/uL (ref 0.0–0.7)
Monocytes Absolute: 0.3 10*3/uL (ref 0.1–1.0)
Neutro Abs: 1.7 10*3/uL (ref 1.7–7.7)
Neutrophils Relative %: 35 % — ABNORMAL LOW (ref 43–77)

## 2012-03-06 MED ORDER — HYDROMORPHONE HCL PF 1 MG/ML IJ SOLN
1.0000 mg | Freq: Once | INTRAMUSCULAR | Status: AC
  Administered 2012-03-06: 1 mg via INTRAVENOUS
  Filled 2012-03-06: qty 1

## 2012-03-06 MED ORDER — KETOROLAC TROMETHAMINE 30 MG/ML IJ SOLN
30.0000 mg | Freq: Once | INTRAMUSCULAR | Status: AC
  Administered 2012-03-06: 30 mg via INTRAVENOUS
  Filled 2012-03-06: qty 1

## 2012-03-06 MED ORDER — MORPHINE SULFATE 4 MG/ML IJ SOLN
4.0000 mg | Freq: Once | INTRAMUSCULAR | Status: AC
  Administered 2012-03-06: 4 mg via INTRAVENOUS
  Filled 2012-03-06: qty 1

## 2012-03-06 MED ORDER — ONDANSETRON HCL 4 MG/2ML IJ SOLN
4.0000 mg | Freq: Once | INTRAMUSCULAR | Status: AC
  Administered 2012-03-06: 4 mg via INTRAVENOUS
  Filled 2012-03-06: qty 2

## 2012-03-06 MED ORDER — ONDANSETRON HCL 4 MG PO TABS: 4.0000 mg | ORAL_TABLET | Freq: Four times a day (QID) | ORAL | Status: AC

## 2012-03-06 MED ORDER — SODIUM CHLORIDE 0.9 % IV BOLUS (SEPSIS)
1000.0000 mL | Freq: Once | INTRAVENOUS | Status: AC
  Administered 2012-03-06: 1000 mL via INTRAVENOUS

## 2012-03-06 MED ORDER — OXYCODONE-ACETAMINOPHEN 5-325 MG PO TABS: 1.0000 | ORAL_TABLET | ORAL | Status: AC | PRN

## 2012-03-06 NOTE — ED Notes (Signed)
Pt c/o LT flank pain since Fri- passed large kidney stone Fri, but still having pain

## 2012-03-06 NOTE — ED Provider Notes (Signed)
History     CSN: [id number]  Arrival date & time 03/06/12  4098   First MD Initiated Contact with Patient 03/06/12 1018      Chief Complaint  Patient presents with  . Flank Pain    (Consider location/radiation/quality/duration/timing/severity/associated sxs/prior treatment) HPI Pt with extensive Hx of renal stones p/w L flank pain radiating to L groin. Passed large renal stone and has had persistent pain. No fever, chill, urinary symptoms.  Past Medical History  Diagnosis Date  . Hyperlipidemia   . Diabetes mellitus   . Kidney stones   . Recurrent UTI   . Non-alcoholic fatty liver disease   . Weight decrease   . Chills   . Fatigue     loss of sleep  . Dizziness   . Generalized headaches   . Wears glasses   . Blood in urine   . Liver disease   . Poor appetite   . Nausea & vomiting   . Abdominal pain   . Blurred vision     when feeling very fatigued   . Ovarian cyst     Past Surgical History  Procedure Date  . Cholecystectomy 2005  . Partial hysterectomy 2008  . Kidney stones   . Gastric bypass 10/2010  . Tubal ligation     x3  . Abdominal hysterectomy   . Cystostomy w/ bladder biopsy   . Exploratory laparotomy 09/01/11  . Laparoscopy 10/19/2011    Procedure: LAPAROSCOPY OPERATIVE;  Surgeon: Miguel Aschoff;  Location: [facility];  Service: Gynecology;  Laterality: N/A;  Operative Laparoscopy with Lysis Of Adhesions  . Laparotomy 10/19/2011    Procedure: LAPAROTOMY;  Surgeon: Miguel Aschoff;  Location: [facility];  Service: Gynecology;  Laterality: Bilateral;  Laparotomy With Bilateral Oophorectomy    Family History  Problem Relation Age of Onset  . Lung cancer Mother   . Cancer Mother     lung  . Hyperlipidemia Father   . Heart disease Father   . Stroke Father   . Hypertension Father   . Diabetes Father   . Hypertension Brother   . Other Brother     suicide    History  Substance Use Topics  . Smoking status: Never Smoker   . Smokeless tobacco: Not on file  .  Alcohol Use: No    OB History    Grav Para Term Preterm Abortions TAB SAB Ect Mult Living                  Review of Systems  Constitutional: Negative for fever, chills and fatigue.  Gastrointestinal: Positive for nausea and vomiting. Negative for abdominal pain.  Genitourinary: Positive for flank pain. Negative for dysuria, hematuria and pelvic pain.    Allergies  Review of patient's allergies indicates no known allergies.  Home Medications   Current Outpatient Rx  Name Route Sig Dispense Refill  . ALLOPURINOL PO Oral Take by mouth.    . ESTRADIOL 1 MG PO TABS Oral Take 1 mg by mouth daily.    Marland Kitchen ALPRAZOLAM 0.5 MG PO TABS Oral Take 1 tablet (0.5 mg total) by mouth 3 (three) times daily as needed for sleep. 30 tablet 1  . CHLOROTHIAZIDE PO  25 mg. Take 1/2 tablet daily.    Marland Kitchen CITALOPRAM HYDROBROMIDE 40 MG PO TABS Oral Take 1 tablet (40 mg total) by mouth daily. 30 tablet 3  . ONDANSETRON HCL 4 MG PO TABS Oral Take 1 tablet (4 mg total) by mouth every 6 (six) hours.  12 tablet 0  . OXYCODONE-ACETAMINOPHEN 5-325 MG PO TABS Oral Take 1 tablet by mouth every 4 (four) hours as needed for pain. 15 tablet 0  . POTASSIUM CHLORIDE CRYS ER 20 MEQ PO TBCR Oral Take 1 tablet (20 mEq total) by mouth daily. 30 tablet 3  . POTASSIUM CHLORIDE CRYS ER 20 MEQ PO TBCR Oral Take 1 tablet (20 mEq total) by mouth daily. 30 tablet 3  . ROSUVASTATIN CALCIUM 20 MG PO TABS Oral Take 1 tablet (20 mg total) by mouth daily. 30 tablet 1  . TRAMADOL HCL 50 MG PO TABS  TAKE ONE TABLET BY MOUTH EVERY 6 HOURS AS NEEDED FOR PAIN 45 tablet 1  . VALACYCLOVIR HCL 1 G PO TABS  2 tabs x1 dose.  Repeat in 12 hrs. 4 tablet 3  . ZOLPIDEM TARTRATE ER 12.5 MG PO TBCR Oral Take 1 tablet (12.5 mg total) by mouth at bedtime as needed for sleep. 30 tablet 1    BP 115/59  Pulse 86  Temp(Src) 98.2 F (36.8 C) (Oral)  Resp 14  Ht 5\' 4"  (1.626 m)  Wt 174 lb (78.926 kg)  BMI 29.87 kg/m2  SpO2 99%  Physical Exam  Nursing note  and vitals reviewed. Constitutional: She is oriented to person, place, and time. She appears well-developed and well-nourished. No distress.  HENT:  Head: Normocephalic and atraumatic.  Mouth/Throat: Oropharynx is clear and moist.  Eyes: EOM are normal. Pupils are equal, round, and reactive to light.  Neck: Normal range of motion. Neck supple.  Cardiovascular: Normal rate and regular rhythm.   Pulmonary/Chest: Effort normal and breath sounds normal. No respiratory distress. She has no wheezes. She has no rales.  Abdominal: Soft. Bowel sounds are normal. She exhibits no mass. There is no tenderness. There is no rebound and no guarding.  Musculoskeletal: Normal range of motion. She exhibits no edema and no tenderness.  Neurological: She is alert and oriented to person, place, and time.  Skin: Skin is warm and dry. No rash noted. No erythema.  Psychiatric: She has a normal mood and affect. Her behavior is normal.    ED Course  Procedures (including critical care time)  Labs Reviewed  URINALYSIS, ROUTINE W REFLEX MICROSCOPIC - Abnormal; Notable for the following:    APPearance CLOUDY (*)    Hgb urine dipstick LARGE (*)    All other components within normal limits  CBC - Abnormal; Notable for the following:    Platelets 87 (*) PLATELET COUNT CONFIRMED BY SMEAR   All other components within normal limits  DIFFERENTIAL - Abnormal; Notable for the following:    Neutrophils Relative 35 (*)    Lymphocytes Relative 55 (*)    All other components within normal limits  BASIC METABOLIC PANEL - Abnormal; Notable for the following:    Potassium 3.3 (*)    Glucose, Bld 106 (*)    GFR calc non Af Amer 70 (*)    GFR calc Af Amer 81 (*)    All other components within normal limits  URINE MICROSCOPIC-ADD ON - Abnormal; Notable for the following:    Squamous Epithelial / LPF MANY (*)    Bacteria, UA MANY (*)    All other components within normal limits  PREGNANCY, URINE   Ct Abdomen Pelvis Wo  Contrast  03/06/2012  *RADIOLOGY REPORT*  Clinical Data: Left-sided flank pain.  CT ABDOMEN AND PELVIS WITHOUT CONTRAST  Technique:  Multidetector CT imaging of the abdomen and pelvis was performed following the  standard protocol without intravenous contrast.  Comparison: CT of abdomen and pelvis dated 01/09/2012.  Findings:  Lung Bases: Visualized lung bases are clear.  Faint calcifications of the aortic valve.  Heart size is borderline enlarged.  Abdomen/Pelvis: There are no abnormal urinary tract calcifications within the collecting system of either kidney, along the course of either ureter, or within the lumen of the urinary bladder.  No hydroureteronephrosis or perinephric stranding.  The patient is status post cholecystectomy.  There is a 9 mm low attenuation lesion in the inferior aspect of segment six of the liver which is unchanged, but is not characterized on today's noncontrast CT scan.  The liver has a slightly shrunken and slightly nodular contour, which could be indicative of early cirrhosis.  Postoperative changes of gastric bypass again noted. Again noted are some very subtle areas of stranding around the pancreatic head and uncinate process, similar to prior study from 01/09/2012.  The remainder of the body and tail of the pancreas are otherwise unremarkable in appearance.  The unenhanced appearance of the spleen, bilateral adrenal glands and bilateral kidneys is unremarkable.  The patient is status post hysterectomy.  Ovaries are unremarkable in appearance.  No significant volume of ascites.  No definite pneumoperitoneum.  No pathologic distension of bowel.  Musculoskeletal: There are no aggressive appearing lytic or blastic lesions noted in the visualized portions of the skeleton.  IMPRESSION: 1.  No abnormal urinary tract calcifications. 2.  Subtle stranding around the head of the pancreas and uncinate process, similar to prior study 01/09/2012.  This appears to be chronic in this patient,  however, similar findings can be seen in the setting of early or mild pancreatitis.  Clinical correlation is recommended. 3.  Status post cholecystectomy, gastric bypass and hysterectomy. 4.  9 mm low attenuation lesion in segment six of the liver is not characterized on this noncontrast study, but is statistically likely to represent a small cyst. 5.  Findings again suspicious for early cirrhosis, as above.  6. There are calcifications of the aortic valve.  Echocardiographic correlation for evaluation of potential valvular dysfunction may be warranted if clinically indicated.  Original Report Authenticated By: Florencia Reasons, M.D.     1. Lt flank pain       MDM    Pt feeling better. F/u with urologist. Return for concerns      Loren Racer, MD 03/06/12 1351

## 2012-03-15 ENCOUNTER — Telehealth: Payer: Self-pay | Admitting: Family Medicine

## 2012-03-15 MED ORDER — CITALOPRAM HYDROBROMIDE 40 MG PO TABS: 40.0000 mg | ORAL_TABLET | Freq: Every day | ORAL | Status: DC

## 2012-03-15 NOTE — Telephone Encounter (Signed)
Refill for  Citalopram HBR 40MG  Tablet  Qty 90 Written 2.20.13

## 2012-03-23 ENCOUNTER — Telehealth: Payer: Self-pay | Admitting: Family Medicine

## 2012-03-23 ENCOUNTER — Ambulatory Visit (INDEPENDENT_AMBULATORY_CARE_PROVIDER_SITE_OTHER): Payer: Managed Care, Other (non HMO) | Admitting: Internal Medicine

## 2012-03-23 ENCOUNTER — Encounter: Payer: Self-pay | Admitting: Internal Medicine

## 2012-03-23 DIAGNOSIS — K760 Fatty (change of) liver, not elsewhere classified: Secondary | ICD-10-CM

## 2012-03-23 DIAGNOSIS — R109 Unspecified abdominal pain: Secondary | ICD-10-CM

## 2012-03-23 DIAGNOSIS — R1031 Right lower quadrant pain: Secondary | ICD-10-CM

## 2012-03-23 DIAGNOSIS — K7689 Other specified diseases of liver: Secondary | ICD-10-CM

## 2012-03-23 DIAGNOSIS — R195 Other fecal abnormalities: Secondary | ICD-10-CM

## 2012-03-23 LAB — POCT URINALYSIS DIPSTICK
Bilirubin, UA: NEGATIVE
Glucose, UA: NEGATIVE
Ketones, UA: NEGATIVE
Leukocytes, UA: NEGATIVE
Protein, UA: NEGATIVE
Spec Grav, UA: 1.015

## 2012-03-23 MED ORDER — HYOSCYAMINE SULFATE 0.125 MG SL SUBL: 0.1250 mg | SUBLINGUAL_TABLET | SUBLINGUAL | Status: DC | PRN

## 2012-03-23 NOTE — Progress Notes (Signed)
  Subjective:    Patient ID: [name], female    DOB: 02-Nov-1971, 41 y.o.   MRN: [id number]  HPI ABDOMINAL PAIN: Onset:few weeks ago Location:RLQ Character:sharp   Radiation:to R flank & superiorly  Severity: up to 7 Duration:initially intermittent , constant X 3 days  Better with: no Worse with: BMs Symptoms Nausea/Vomiting: no Diarrhea: no Constipation:yes as of 1 week ago with bloating. She took a laxative 3/24 with a subsequent production of small stools Melena/BRBPR: no but white mucus X 2  Hematemesis: no  Anorexia: yes  Fever/Chills: no Dysuria/hematuria/pyuria: no Rash: no Wt loss:gained 7 # over 10 days  NSAIDs/ASA: no  LMP: S/P  TAH for polycystic ovaries 2007; BSO 2012  Her extensive past surgical history was  reviewed. She's had multiple laparoscopic procedures as well as bariatric surgery, upper endoscopy, colonoscopy. She still has her appendix.    Family history of GI problems         Review of Systems she describes some clay-colored stool for 2-3 weeks. She denies any dark or:-colored urine     Objective:   Physical Exam Gen.: Healthy and well-nourished in appearance. Alert, appropriate and cooperative throughout exam.  Eyes: No corneal or conjunctival inflammation noted. No icterusEars: External  ear exam reveals no significant lesions or deformities. Canals clear .TMs normal. Hearing is grossly normal bilaterally. Nose: External nasal exam reveals no deformity or inflammation. Nasal mucosa are pink and moist. No lesions or exudates noted.  Mouth: Oral mucosa and oropharynx reveal no lesions or exudates. Teeth in good repair. Neck: No deformities, masses, or tenderness noted.  Lungs: Normal respiratory effort; chest expands symmetrically. Lungs are clear to auscultation without rales, wheezes, or increased work of breathing. Heart: Normal rate and rhythm. Normal S1 and S2. No gallop, click, or rub.S4 w/o  murmur. Abdomen: Bowel sounds normal;  abdomen soft but slightly tender RLQmedially. No masses, organomegaly or hernias noted.                                             Musculoskeletal/extremities: No clubbing, cyanosis, edema, or deformity noted.  Nail health  Good.Neg SLR bilaterally Vascular: Carotid, radial artery, dorsalis pedis and  posterior tibial pulses are full and equal. No bruits present. Neurologic: Alert and oriented x3.   Skin: Intact without suspicious lesions or rashes. Lymph: No cervical, axillary lymphadenopathy present. Psych: Mood and affect are normal. Normally interactive                                                                                         Assessment & Plan:  #1 right lower quadrant abdominal pain, most likely etiology would be adhesions related to multiple surgeries. She has not had an appendectomy but clinically this is not present  #2 clay-colored stool with the present symptoms  #3 history of nonalcoholic cirrhosis  Plan: See orders and recommendations

## 2012-03-23 NOTE — Patient Instructions (Signed)
Stay on clear liquids for 48-72 hours .This would include  jello, sherbert (NOT ice cream), Lipton's chicken noodle soup(NOT cream based soups),Gatorade Lite, flat Ginger ale (without High Fructose Corn Syrup),dry toast or crackers, baked potato.No milk , dairy or grease .  Report increasing pain, fever or rectal bleeding

## 2012-03-23 NOTE — Telephone Encounter (Signed)
Caller: Pearlee/Mother; PCP: Sheliah Hatch.; CB#: [phone]; Call regarding RLQ  Abdominal Pain; Onset 03/20/12.  Pain rated at 4 of 10, Constant.  S/p  hysterectomy and oophorectomy. Pain described as deep and burning.  Hx of kidney stones, Gastric Bypass.  Last stooled on 3/25.  Afebrile/subjective. Per Abdominal Pain protocol advised see in 4 hours due to possible dehydration; dry mouth and tongue, infrequent urination.  Appt. w Dr. Alwyn Ren at [phone].  Home care for the interim and parameters for callback given.

## 2012-03-23 NOTE — Telephone Encounter (Signed)
Agree w appt

## 2012-03-24 LAB — HEPATIC FUNCTION PANEL
ALT: 28 U/L (ref 0–35)
AST: 39 U/L — ABNORMAL HIGH (ref 0–37)
Total Protein: 6.9 g/dL (ref 6.0–8.3)

## 2012-03-24 LAB — BASIC METABOLIC PANEL
BUN: 16 mg/dL (ref 6–23)
Calcium: 9.1 mg/dL (ref 8.4–10.5)
GFR: 66.06 mL/min (ref >60.00)
Glucose, Bld: 98 mg/dL (ref 70–99)
Potassium: 3.7 mEq/L (ref 3.5–5.1)
Sodium: 137 mEq/L (ref 135–145)

## 2012-03-24 LAB — CBC WITH DIFFERENTIAL/PLATELET
Basophils Relative: 0.3 % (ref 0.0–3.0)
Eosinophils Relative: 2.6 % (ref 0.0–5.0)
HCT: 38.8 % (ref 36.0–46.0)
Hemoglobin: 13.2 g/dL (ref 12.0–15.0)
Lymphs Abs: 2.4 10*3/uL (ref 0.7–4.0)
MCV: 88.9 fl (ref 78.0–100.0)
Monocytes Absolute: 0.2 10*3/uL (ref 0.1–1.0)
Monocytes Relative: 5.2 % (ref 3.0–12.0)
RBC: 4.36 Mil/uL (ref 3.87–5.11)
WBC: 4.6 10*3/uL (ref 4.5–10.5)

## 2012-03-28 ENCOUNTER — Telehealth: Payer: Self-pay

## 2012-03-28 NOTE — Telephone Encounter (Signed)
Msg from patient requesting lab results from Last week. Please advise     KP

## 2012-03-29 NOTE — Telephone Encounter (Signed)
Called pt to advise lab results, pt understood and noted that she can log into my chart to review, no need to mail results.

## 2012-04-05 ENCOUNTER — Other Ambulatory Visit: Payer: Self-pay | Admitting: *Deleted

## 2012-04-05 MED ORDER — TRAMADOL HCL 50 MG PO TABS: 50.0000 mg | ORAL_TABLET | Freq: Four times a day (QID) | ORAL | Status: DC | PRN

## 2012-04-05 NOTE — Telephone Encounter (Signed)
Received fax for tramadol 50mg  #45, noted last refill 12-17-11 #45 with 1 refill, advised per MD Beverely Low verbal order to send medication to pharmacy, rx sent to pharmacy by e-script

## 2012-04-11 ENCOUNTER — Telehealth: Payer: Self-pay | Admitting: Family Medicine

## 2012-04-11 MED ORDER — ZOLPIDEM TARTRATE ER 12.5 MG PO TBCR: 12.5000 mg | EXTENDED_RELEASE_TABLET | Freq: Every evening | ORAL | Status: DC | PRN

## 2012-04-11 NOTE — Telephone Encounter (Signed)
Refill: Zolpidem tart er 12.5 mg tab. Take 1 tablet at bedtime as needed for sleep. °

## 2012-04-11 NOTE — Telephone Encounter (Signed)
Last OV noted with Hopper on 03-23-12, last OV with MD Tabori noted 01-14-12, last refill 02-15-12 #30 with 1 refill

## 2012-04-11 NOTE — Telephone Encounter (Signed)
Rx sent 

## 2012-04-11 NOTE — Telephone Encounter (Signed)
Ok for #30, 1 refill 

## 2012-04-14 ENCOUNTER — Telehealth: Payer: Self-pay | Admitting: Family Medicine

## 2012-04-14 NOTE — Telephone Encounter (Signed)
Noted incoming phone call was sent from [place] [facility], rx was faxed to [facility] in [place] by mistake, contact [facility] to advise to cancel order and apologized for inconveince faxed to correct fax number [phone] clarfied by [facility] rep in [place],

## 2012-04-14 NOTE — Telephone Encounter (Signed)
Can you please re-send prescription sent 4.15.13 for Zolpidem [facility] did not receive thanks

## 2012-04-28 ENCOUNTER — Other Ambulatory Visit: Payer: Self-pay | Admitting: Internal Medicine

## 2012-04-28 ENCOUNTER — Other Ambulatory Visit: Payer: Self-pay | Admitting: Family Medicine

## 2012-04-28 MED ORDER — ALPRAZOLAM 0.5 MG PO TABS: 0.5000 mg | ORAL_TABLET | Freq: Three times a day (TID) | ORAL | Status: DC | PRN

## 2012-04-28 NOTE — Telephone Encounter (Signed)
.  rx faxed to pharmacy, manually.  

## 2012-04-28 NOTE — Telephone Encounter (Signed)
Last OV 01-14-12  last refill 02-23-12 #30 plus 1 refill

## 2012-04-28 NOTE — Telephone Encounter (Signed)
refill for Alprazolam 0.5MG  Tab Qty 30 Take one tablet by mouth three times daily as needed for anxiety and for sleep Last filled 3.23.13  Last OV 3.27.13

## 2012-04-28 NOTE — Telephone Encounter (Signed)
Ok for #30, 1 refill 

## 2012-04-29 NOTE — Telephone Encounter (Signed)
Called pt to clarify if she continues to need the medication requested, left vm on home phone

## 2012-04-29 NOTE — Telephone Encounter (Signed)
Hopp gave this at an acute visit.  Is this something she still needs?

## 2012-05-03 NOTE — Telephone Encounter (Signed)
Pt advised that she has three left and she does still use this but only as PRN, she states that this does help her somewhat, please advise

## 2012-05-16 ENCOUNTER — Emergency Department (HOSPITAL_BASED_OUTPATIENT_CLINIC_OR_DEPARTMENT_OTHER)
Admission: EM | Admit: 2012-05-16 | Discharge: 2012-05-16 | Disposition: A | Discharge status: Home / Self Care | Payer: Managed Care, Other (non HMO) | Attending: Emergency Medicine | Admitting: Emergency Medicine

## 2012-05-16 ENCOUNTER — Encounter (HOSPITAL_BASED_OUTPATIENT_CLINIC_OR_DEPARTMENT_OTHER): Payer: Self-pay

## 2012-05-16 DIAGNOSIS — Z9079 Acquired absence of other genital organ(s): Secondary | ICD-10-CM | POA: Insufficient documentation

## 2012-05-16 DIAGNOSIS — R319 Hematuria, unspecified: Secondary | ICD-10-CM | POA: Insufficient documentation

## 2012-05-16 DIAGNOSIS — N23 Unspecified renal colic: Secondary | ICD-10-CM

## 2012-05-16 DIAGNOSIS — R112 Nausea with vomiting, unspecified: Secondary | ICD-10-CM | POA: Insufficient documentation

## 2012-05-16 DIAGNOSIS — Z87440 Personal history of urinary (tract) infections: Secondary | ICD-10-CM | POA: Insufficient documentation

## 2012-05-16 DIAGNOSIS — E785 Hyperlipidemia, unspecified: Secondary | ICD-10-CM | POA: Insufficient documentation

## 2012-05-16 DIAGNOSIS — E119 Type 2 diabetes mellitus without complications: Secondary | ICD-10-CM | POA: Insufficient documentation

## 2012-05-16 DIAGNOSIS — Z9884 Bariatric surgery status: Secondary | ICD-10-CM | POA: Insufficient documentation

## 2012-05-16 DIAGNOSIS — R109 Unspecified abdominal pain: Secondary | ICD-10-CM | POA: Insufficient documentation

## 2012-05-16 DIAGNOSIS — Z87442 Personal history of urinary calculi: Secondary | ICD-10-CM | POA: Insufficient documentation

## 2012-05-16 LAB — URINALYSIS, ROUTINE W REFLEX MICROSCOPIC
Bilirubin Urine: NEGATIVE
Glucose, UA: NEGATIVE mg/dL
Specific Gravity, Urine: 1.023 (ref 1.005–1.030)
pH: 6 (ref 5.0–8.0)

## 2012-05-16 LAB — URINE MICROSCOPIC-ADD ON

## 2012-05-16 MED ORDER — HYDROCODONE-ACETAMINOPHEN 5-325 MG PO TABS: 2.0000 | ORAL_TABLET | ORAL | Status: AC | PRN

## 2012-05-16 MED ORDER — HYDROMORPHONE HCL PF 1 MG/ML IJ SOLN
1.0000 mg | Freq: Once | INTRAMUSCULAR | Status: AC
  Administered 2012-05-16: 1 mg via INTRAVENOUS
  Filled 2012-05-16: qty 1

## 2012-05-16 MED ORDER — KETOROLAC TROMETHAMINE 30 MG/ML IJ SOLN
30.0000 mg | Freq: Once | INTRAMUSCULAR | Status: AC
  Administered 2012-05-16: 30 mg via INTRAVENOUS
  Filled 2012-05-16: qty 1

## 2012-05-16 MED ORDER — PROMETHAZINE HCL 25 MG PO TABS: 25.0000 mg | ORAL_TABLET | Freq: Four times a day (QID) | ORAL | Status: DC | PRN

## 2012-05-16 MED ORDER — HYDROMORPHONE HCL PF 1 MG/ML IJ SOLN
INTRAMUSCULAR | Status: AC
  Filled 2012-05-16: qty 1

## 2012-05-16 MED ORDER — SODIUM CHLORIDE 0.9 % IV BOLUS (SEPSIS)
1000.0000 mL | Freq: Once | INTRAVENOUS | Status: AC
  Administered 2012-05-16: 1000 mL via INTRAVENOUS

## 2012-05-16 MED ORDER — ONDANSETRON HCL 4 MG/2ML IJ SOLN
INTRAMUSCULAR | Status: AC
  Filled 2012-05-16: qty 2

## 2012-05-16 MED ORDER — ONDANSETRON HCL 4 MG/2ML IJ SOLN
4.0000 mg | Freq: Once | INTRAMUSCULAR | Status: AC
  Administered 2012-05-16: 4 mg via INTRAVENOUS
  Filled 2012-05-16: qty 2

## 2012-05-16 MED ORDER — HYDROMORPHONE HCL PF 1 MG/ML IJ SOLN
1.0000 mg | Freq: Once | INTRAMUSCULAR | Status: AC
  Administered 2012-05-16: 1 mg via INTRAVENOUS

## 2012-05-16 MED ORDER — ONDANSETRON HCL 4 MG/2ML IJ SOLN
4.0000 mg | Freq: Once | INTRAMUSCULAR | Status: AC
  Administered 2012-05-16: 4 mg via INTRAVENOUS

## 2012-05-16 NOTE — ED Notes (Signed)
IVF infusing and pt medicated.

## 2012-05-16 NOTE — ED Notes (Signed)
Pt reports right flank pain radiating to RLQ and groin that started yesterday.

## 2012-05-16 NOTE — ED Notes (Signed)
MD at bedside. 

## 2012-05-16 NOTE — Discharge Instructions (Signed)
Kidney Stones Kidney stones (ureteral lithiasis) are deposits that form inside your kidneys. The intense pain is caused by the stone moving through the urinary tract. When the stone moves, the ureter goes into spasm around the stone. The stone is usually passed in the urine.  CAUSES   A disorder that makes certain neck glands produce too much parathyroid hormone (primary hyperparathyroidism).   A buildup of uric acid crystals.   Narrowing (stricture) of the ureter.   A kidney obstruction present at birth (congenital obstruction).   Previous surgery on the kidney or ureters.   Numerous kidney infections.  SYMPTOMS   Feeling sick to your stomach (nauseous).   Throwing up (vomiting).   Blood in the urine (hematuria).   Pain that usually spreads (radiates) to the groin.   Frequency or urgency of urination.  DIAGNOSIS   Taking a history and physical exam.   Blood or urine tests.   Computerized X-ray scan (CT scan).   Occasionally, an examination of the inside of the urinary bladder (cystoscopy) is performed.  TREATMENT   Observation.   Increasing your fluid intake.   Surgery may be needed if you have severe pain or persistent obstruction.  The size, location, and chemical composition are all important variables that will determine the proper choice of action for you. Talk to your caregiver to better understand your situation so that you will minimize the risk of injury to yourself and your kidney.  HOME CARE INSTRUCTIONS   Drink enough water and fluids to keep your urine clear or pale yellow.   Strain all urine through the provided strainer. Keep all particulate matter and stones for your caregiver to see. The stone causing the pain may be as small as a grain of salt. It is very important to use the strainer each and every time you pass your urine. The collection of your stone will allow your caregiver to analyze it and verify that a stone has actually passed.   Only take  over-the-counter or prescription medicines for pain, discomfort, or fever as directed by your caregiver.   Make a follow-up appointment with your caregiver as directed.   Get follow-up X-rays if required. The absence of pain does not always mean that the stone has passed. It may have only stopped moving. If the urine remains completely obstructed, it can cause loss of kidney function or even complete destruction of the kidney. It is your responsibility to make sure X-rays and follow-ups are completed. Ultrasounds of the kidney can show blockages and the status of the kidney. Ultrasounds are not associated with any radiation and can be performed easily in a matter of minutes.  SEEK IMMEDIATE MEDICAL CARE IF:   Pain cannot be controlled with the prescribed medicine.   You have a fever.   The severity or intensity of pain increases over 18 hours and is not relieved by pain medicine.   You develop a new onset of abdominal pain.   You feel faint or pass out.  You're unable to take fluids due to her vomiting and nausea.  MAKE SURE YOU:   Understand these instructions.   Will watch your condition.   Will get help right away if you are not doing well or get worse.  Document Released: 12/14/2005 Document Revised: 12/03/2011 Document Reviewed: 04/11/2010 [facility] Patient Information 2012 [place], [place].

## 2012-05-16 NOTE — ED Provider Notes (Signed)
History     CSN: [id number]  Arrival date & time 05/16/12  4098   First MD Initiated Contact with Patient 05/16/12 [phone]      Chief Complaint  Patient presents with  . Flank Pain    (Consider location/radiation/quality/duration/timing/severity/associated sxs/prior treatment) HPI This 41 year old female has just over 24 hours of severe colicky right flank pain with nausea and vomiting multiple times, no trauma, no fever, no chest pain, no shortness breath, no midline back pain, no weakness or numbness, no abdominal pain, this feels just like her prior kidney stone pain, she has had multiple CT scans per year for several years, she has had gastric bypass hysterectomy and cholecystectomy, this pain feels just like her prior kidney stone pains, she is now on a CT scan for possible, she did have gross hematuria at home prior to arrival, she tried her home medications with Flomax hydrocodone and Zofran without relief so came to the ED for evaluation today. Past Medical History  Diagnosis Date  . Hyperlipidemia   . Diabetes mellitus   . Kidney stones   . Recurrent UTI   . Non-alcoholic fatty liver disease   . Weight decrease   . Chills   . Fatigue     loss of sleep  . Dizziness   . Generalized headaches   . Wears glasses   . Blood in urine   . Liver disease   . Poor appetite   . Abdominal pain   . Blurred vision     when feeling very fatigued   . Ovarian cyst     Past Surgical History  Procedure Date  . Cholecystectomy 2005  . Kidney stones   . Gastric bypass 10/2010  . Tubal ligation     x3  . Abdominal hysterectomy 2008    for polycystic ovaries  . Cystostomy w/ bladder biopsy   . Exploratory laparotomy 09/01/11  . Laparoscopy 10/19/2011    Procedure: LAPAROSCOPY OPERATIVE;  Surgeon: Miguel Aschoff;  Location: [facility];  Service: Gynecology;  Laterality: N/A;  Operative Laparoscopy with Lysis Of Adhesions  . Laparotomy 10/19/2011    Procedure: LAPAROTOMY;  Surgeon: Miguel Aschoff;   Location: [facility];  Service: Gynecology;  Laterality: Bilateral;  Laparotomy With Bilateral Oophorectomy  . Bilateral salpingoophorectomy 2012    Dr Tenny Craw  . Colonoscopy     negative, done for pain  . Upper gastrointestinal endoscopy     to assess varices    Family History  Problem Relation Age of Onset  . Lung cancer Mother   . Cancer Mother     lung  . Hyperlipidemia Father   . Heart disease Father   . Stroke Father   . Hypertension Father   . Diabetes Father   . Hypertension Brother   . Other Brother     suicide    History  Substance Use Topics  . Smoking status: Never Smoker   . Smokeless tobacco: Not on file  . Alcohol Use: No    OB History    Grav Para Term Preterm Abortions TAB SAB Ect Mult Living                  Review of Systems  Constitutional: Negative for fever.       10 Systems reviewed and are negative for acute change except as noted in the HPI.  HENT: Negative for congestion.   Eyes: Negative for discharge and redness.  Respiratory: Negative for cough and shortness of breath.   Cardiovascular:  Negative for chest pain.  Gastrointestinal: Positive for nausea and vomiting. Negative for abdominal pain and diarrhea.  Genitourinary: Positive for flank pain. Negative for dysuria.  Musculoskeletal: Negative for back pain.  Skin: Negative for rash.  Neurological: Negative for syncope, numbness and headaches.  Psychiatric/Behavioral:       No behavior change.    Allergies  Review of patient's allergies indicates no known allergies.  Home Medications   Current Outpatient Rx  Name Route Sig Dispense Refill  . ALLOPURINOL PO Oral Take by mouth.    . ALPRAZOLAM 0.5 MG PO TABS Oral Take 1 tablet (0.5 mg total) by mouth 3 (three) times daily as needed for sleep. 30 tablet 1  . CHLOROTHIAZIDE PO  25 mg. Take 1/2 tablet daily.    Marland Kitchen CITALOPRAM HYDROBROMIDE 40 MG PO TABS Oral Take 1 tablet (40 mg total) by mouth daily. 90 tablet 0  . ESTRADIOL 1 MG PO TABS  Oral Take 1 mg by mouth daily.    Marland Kitchen HYDROCODONE-ACETAMINOPHEN 5-325 MG PO TABS Oral Take 2 tablets by mouth every 4 (four) hours as needed for pain. 20 tablet 0  . HYOSCYAMINE SULFATE 0.125 MG PO TABS  PLACE 1 TABLET UNDER THE TONGUE EVERY 4 HOURS AS NEEDED FOR CRAMPING. 30 tablet 0  . POTASSIUM CHLORIDE CRYS ER 20 MEQ PO TBCR Oral Take 1 tablet (20 mEq total) by mouth daily. 30 tablet 3  . POTASSIUM CHLORIDE CRYS ER 20 MEQ PO TBCR Oral Take 1 tablet (20 mEq total) by mouth daily. 30 tablet 3  . PROMETHAZINE HCL 25 MG PO TABS Oral Take 1 tablet (25 mg total) by mouth every 6 (six) hours as needed for nausea. 10 tablet 0  . ROSUVASTATIN CALCIUM 20 MG PO TABS Oral Take 1 tablet (20 mg total) by mouth daily. 30 tablet 1  . TRAMADOL HCL 50 MG PO TABS Oral Take 1 tablet (50 mg total) by mouth every 6 (six) hours as needed for pain. 45 tablet 1  . VALACYCLOVIR HCL 1 G PO TABS  2 tabs x1 dose.  Repeat in 12 hrs. 4 tablet 3  . ZOLPIDEM TARTRATE ER 12.5 MG PO TBCR Oral Take 1 tablet (12.5 mg total) by mouth at bedtime as needed for sleep. 30 tablet 1    BP 116/70  Pulse 87  Temp(Src) 97.9 F (36.6 C) (Oral)  Resp 16  Ht 5\' 3"  (1.6 m)  Wt 170 lb (77.111 kg)  BMI 30.11 kg/m2  SpO2 97%  Physical Exam  Nursing note and vitals reviewed. Constitutional:       Awake, alert, nontoxic appearance.  HENT:  Head: Atraumatic.       Oral mucosa somewhat dry  Eyes: Right eye exhibits no discharge. Left eye exhibits no discharge.  Neck: Neck supple.  Cardiovascular: Normal rate and regular rhythm.   No murmur heard. Pulmonary/Chest: Effort normal and breath sounds normal. No respiratory distress. She has no wheezes. She has no rales. She exhibits no tenderness.  Abdominal: Soft. Bowel sounds are normal. She exhibits no mass. There is no tenderness. There is no rebound and no guarding.       No midline back tenderness but she does have positive right CVA tenderness  Musculoskeletal: She exhibits no edema  and no tenderness.       Baseline ROM, no obvious new focal weakness. Patient moves all 4 extremities well with normal light touch.  Neurological:       Mental status and motor strength appears  baseline for patient and situation.  Skin: No rash noted.  Psychiatric: She has a normal mood and affect.    ED Course  Procedures (including critical care time) Pt feels improved after observation and/or treatment in ED. Labs Reviewed  URINALYSIS, ROUTINE W REFLEX MICROSCOPIC - Abnormal; Notable for the following:    APPearance CLOUDY (*)    Hgb urine dipstick LARGE (*)    Leukocytes, UA TRACE (*)    All other components within normal limits  URINE MICROSCOPIC-ADD ON   No results found.   1. Renal colic   2. Hematuria       MDM  Pt stable in ED with no significant deterioration in condition.Patient / Family / Caregiver informed of clinical course, understand medical decision-making process, and agree with plan.I doubt any other EMC precluding discharge at this time including, but not necessarily limited to the following:SBI.        Hurman Horn, MD 05/23/12 [phone]

## 2012-06-08 ENCOUNTER — Other Ambulatory Visit: Payer: Self-pay | Admitting: Family Medicine

## 2012-06-08 NOTE — Telephone Encounter (Signed)
Last OV 01-14-12 last refill 04-11-12 #30 with 1 refill

## 2012-06-08 NOTE — Telephone Encounter (Signed)
refill ambien cr 12.5mg  tab Take one tablet by mouth at bedtime as needed for sleep Qty 30 Last fill 5.31.13 Last ov 5.27.13

## 2012-06-08 NOTE — Telephone Encounter (Signed)
More than 2 weeks early for refill.  None at this time.

## 2012-06-10 NOTE — Telephone Encounter (Signed)
Spoke with Santina Evans at [facility]. She states the last time pt filled rx there was 04/10/12. She is seeing that pt recently filled Rx on 05/25/12 at [facility] in [place]. Mardee Postin that current request has been denied.

## 2012-06-17 ENCOUNTER — Other Ambulatory Visit: Payer: Self-pay | Admitting: Family Medicine

## 2012-06-17 MED ORDER — CITALOPRAM HYDROBROMIDE 40 MG PO TABS: 40.0000 mg | ORAL_TABLET | Freq: Every day | ORAL | Status: DC

## 2012-06-17 NOTE — Telephone Encounter (Signed)
rx sent to pharmacy by e-script  

## 2012-06-17 NOTE — Telephone Encounter (Signed)
Refill Citalopram  HBR 40MG  Tablet #30, take one tablet every day, last fill 3.27.13, last ov 3.27.13

## 2012-06-27 ENCOUNTER — Telehealth: Payer: Self-pay | Admitting: Family Medicine

## 2012-06-27 MED ORDER — ALPRAZOLAM 0.5 MG PO TABS: 0.5000 mg | ORAL_TABLET | Freq: Three times a day (TID) | ORAL | Status: DC | PRN

## 2012-06-27 NOTE — Telephone Encounter (Signed)
Ok for #30, 3 refills 

## 2012-06-27 NOTE — Telephone Encounter (Signed)
Last OV with MD Beverely Low 01-14-12 with [facility] on 03-23-12 last refill #30 with 1 refill on 04-28-12

## 2012-06-27 NOTE — Telephone Encounter (Signed)
Refill: Alprazolam 0.5mg  tab. Take one tablet by mouth three times daily as needed for sleep. Qty 30. Last fill 06-06-12

## 2012-06-27 NOTE — Telephone Encounter (Signed)
.  rx faxed to pharmacy, manually.  

## 2012-06-28 ENCOUNTER — Telehealth: Payer: Self-pay | Admitting: Family Medicine

## 2012-06-28 MED ORDER — HYOSCYAMINE SULFATE 0.125 MG PO TABS: 0.1250 mg | ORAL_TABLET | ORAL | Status: DC | PRN

## 2012-06-28 NOTE — Telephone Encounter (Signed)
rx sent to pharmacy by e-script  

## 2012-06-28 NOTE — Telephone Encounter (Signed)
Refill: hyoscyamine 0.125mg  tab. Place 1 tablet under the tongue every 4 hours as needed for cramping. Qty 30. Last fill 5.8.13

## 2012-07-19 ENCOUNTER — Other Ambulatory Visit: Payer: Self-pay | Admitting: Family Medicine

## 2012-07-19 NOTE — Telephone Encounter (Signed)
Last OV 01-14-12 noted as hospital f/u, unable to locate CPE for pt, did note on 06-01-11 Establish OV to schedule CPE in 3 months (09-01-11) last refill noted last refill for Ambein 04-11-12 #30 with 1 refill

## 2012-07-19 NOTE — Telephone Encounter (Signed)
Refill AMBIEN CR 12.5 MG Take 1 tablet (12.5 mg total) by mouth at bedtime as needed for sleep, #30, last fill 6.21.13, last ov 3.27.13 ([facility])

## 2012-07-20 MED ORDER — ZOLPIDEM TARTRATE ER 12.5 MG PO TBCR: 12.5000 mg | EXTENDED_RELEASE_TABLET | Freq: Every evening | ORAL | Status: DC | PRN

## 2012-07-20 NOTE — Telephone Encounter (Signed)
.  rx faxed to pharmacy, manually. Left vm on pt phone to advise she is overdue for CPE and needs to call office to schedule a fasting CPE asap

## 2012-07-20 NOTE — Telephone Encounter (Signed)
Ok for #30, remind her to schedule CPE

## 2012-07-29 ENCOUNTER — Other Ambulatory Visit: Payer: Self-pay | Admitting: Family Medicine

## 2012-07-29 MED ORDER — HYOSCYAMINE SULFATE 0.125 MG PO TABS: 0.1250 mg | ORAL_TABLET | ORAL | Status: DC | PRN

## 2012-07-29 NOTE — Telephone Encounter (Signed)
HYOSCYAMINE SULF 0.125 MG TABLET QTY:30 LAST REFILLED 06/28/12 TAKE 1 TABLET BY MOUTH EVERY 4 HOURS AS NEEDED FOR CRAMPING

## 2012-07-29 NOTE — Telephone Encounter (Signed)
rx sent to pharmacy by e-script  

## 2012-08-14 ENCOUNTER — Emergency Department (HOSPITAL_BASED_OUTPATIENT_CLINIC_OR_DEPARTMENT_OTHER)
Admission: EM | Admit: 2012-08-14 | Discharge: 2012-08-14 | Disposition: A | Discharge status: Home / Self Care | Payer: Managed Care, Other (non HMO) | Attending: Emergency Medicine | Admitting: Emergency Medicine

## 2012-08-14 ENCOUNTER — Encounter (HOSPITAL_BASED_OUTPATIENT_CLINIC_OR_DEPARTMENT_OTHER): Payer: Self-pay | Admitting: *Deleted

## 2012-08-14 ENCOUNTER — Emergency Department (HOSPITAL_BASED_OUTPATIENT_CLINIC_OR_DEPARTMENT_OTHER): Payer: Managed Care, Other (non HMO)

## 2012-08-14 DIAGNOSIS — R1031 Right lower quadrant pain: Secondary | ICD-10-CM | POA: Insufficient documentation

## 2012-08-14 DIAGNOSIS — R109 Unspecified abdominal pain: Secondary | ICD-10-CM

## 2012-08-14 DIAGNOSIS — Z79899 Other long term (current) drug therapy: Secondary | ICD-10-CM | POA: Insufficient documentation

## 2012-08-14 DIAGNOSIS — Z9089 Acquired absence of other organs: Secondary | ICD-10-CM | POA: Insufficient documentation

## 2012-08-14 DIAGNOSIS — E119 Type 2 diabetes mellitus without complications: Secondary | ICD-10-CM | POA: Insufficient documentation

## 2012-08-14 DIAGNOSIS — E785 Hyperlipidemia, unspecified: Secondary | ICD-10-CM | POA: Insufficient documentation

## 2012-08-14 DIAGNOSIS — R319 Hematuria, unspecified: Secondary | ICD-10-CM | POA: Insufficient documentation

## 2012-08-14 LAB — URINALYSIS, ROUTINE W REFLEX MICROSCOPIC
Bilirubin Urine: NEGATIVE
Protein, ur: NEGATIVE mg/dL
Urobilinogen, UA: 0.2 mg/dL (ref 0.0–1.0)

## 2012-08-14 LAB — BASIC METABOLIC PANEL
BUN: 18 mg/dL (ref 6–23)
Calcium: 9 mg/dL (ref 8.4–10.5)
Creatinine, Ser: 1.1 mg/dL (ref 0.50–1.10)
GFR calc non Af Amer: 62 mL/min — ABNORMAL LOW (ref >90)
Glucose, Bld: 100 mg/dL — ABNORMAL HIGH (ref 70–99)

## 2012-08-14 MED ORDER — HYDROMORPHONE HCL PF 1 MG/ML IJ SOLN
1.0000 mg | Freq: Once | INTRAMUSCULAR | Status: AC
  Administered 2012-08-14: 1 mg via INTRAVENOUS
  Filled 2012-08-14: qty 1

## 2012-08-14 MED ORDER — ONDANSETRON HCL 4 MG/2ML IJ SOLN
4.0000 mg | Freq: Once | INTRAMUSCULAR | Status: AC
  Administered 2012-08-14: 4 mg via INTRAVENOUS
  Filled 2012-08-14: qty 2

## 2012-08-14 MED ORDER — KETOROLAC TROMETHAMINE 30 MG/ML IJ SOLN
30.0000 mg | Freq: Once | INTRAMUSCULAR | Status: AC
  Administered 2012-08-14: 30 mg via INTRAVENOUS
  Filled 2012-08-14: qty 1

## 2012-08-14 MED ORDER — SODIUM CHLORIDE 0.9 % IV BOLUS (SEPSIS)
1000.0000 mL | Freq: Once | INTRAVENOUS | Status: AC
  Administered 2012-08-14: 1000 mL via INTRAVENOUS

## 2012-08-14 NOTE — ED Notes (Signed)
Pt has hx of kidney stones and states this feels the same. C/O RLQ and flank pain. +dysuria. Yellow bld-tinged vag d/c onset yesterday

## 2012-08-14 NOTE — ED Provider Notes (Signed)
History     CSN: [id number]  Arrival date & time 08/14/12  1319   First MD Initiated Contact with Patient 08/14/12 1417      Chief Complaint  Patient presents with  . Abdominal Pain    (Consider location/radiation/quality/duration/timing/severity/associated sxs/prior treatment) HPI Comments: Patient presents with a chief complaint of right flank pain, hematuria, and nausea that began this morning.  Pain does not radiate.  Pain has been coming in waves and is worse at times.  Symptoms similar to when she had kidney stones in the past.  She reports that she has had kidney stones numerous times in the past, with the last time being 3 weeks ago.  She states that she was evaluated at a hospital in [place] at that time.  She has not taken anything for pain prior to arrival.  She denies vomiting.  She denies dysuria, increased urinary frequency, or urgency.  She had been able to urinate without difficulty.    Patient is a 41 y.o. female presenting with abdominal pain. The history is provided by the patient.  Abdominal Pain The primary symptoms of the illness include nausea. The primary symptoms of the illness do not include abdominal pain, fever, vomiting, diarrhea, hematemesis, dysuria, vaginal discharge or vaginal bleeding.  The patient states that she believes she is currently not pregnant. Additional symptoms associated with the illness include hematuria. Symptoms associated with the illness do not include chills, urgency or frequency.    Past Medical History  Diagnosis Date  . Hyperlipidemia   . Diabetes mellitus   . Kidney stones   . Recurrent UTI   . Non-alcoholic fatty liver disease   . Weight decrease   . Chills   . Fatigue     loss of sleep  . Dizziness   . Generalized headaches   . Wears glasses   . Blood in urine   . Liver disease   . Poor appetite   . Abdominal pain   . Blurred vision     when feeling very fatigued   . Ovarian cyst     Past Surgical History    Procedure Date  . Cholecystectomy 2005  . Kidney stones   . Gastric bypass 10/2010  . Tubal ligation     x3  . Abdominal hysterectomy 2008    for polycystic ovaries  . Cystostomy w/ bladder biopsy   . Exploratory laparotomy 09/01/11  . Laparoscopy 10/19/2011    Procedure: LAPAROSCOPY OPERATIVE;  Surgeon: Miguel Aschoff;  Location: [facility];  Service: Gynecology;  Laterality: N/A;  Operative Laparoscopy with Lysis Of Adhesions  . Laparotomy 10/19/2011    Procedure: LAPAROTOMY;  Surgeon: Miguel Aschoff;  Location: [facility];  Service: Gynecology;  Laterality: Bilateral;  Laparotomy With Bilateral Oophorectomy  . Bilateral salpingoophorectomy 2012    Dr Tenny Craw  . Colonoscopy     negative, done for pain  . Upper gastrointestinal endoscopy     to assess varices    Family History  Problem Relation Age of Onset  . Lung cancer Mother   . Cancer Mother     lung  . Hyperlipidemia Father   . Heart disease Father   . Stroke Father   . Hypertension Father   . Diabetes Father   . Hypertension Brother   . Other Brother     suicide    History  Substance Use Topics  . Smoking status: Never Smoker   . Smokeless tobacco: Not on file  . Alcohol Use: No  OB History    Grav Para Term Preterm Abortions TAB SAB Ect Mult Living                  Review of Systems  Constitutional: Negative for fever and chills.  Gastrointestinal: Positive for nausea. Negative for vomiting, abdominal pain, diarrhea and hematemesis.  Genitourinary: Positive for hematuria and flank pain. Negative for dysuria, urgency, frequency, decreased urine volume, vaginal bleeding, vaginal discharge, difficulty urinating and pelvic pain.  Neurological: Negative for dizziness, syncope and light-headedness.    Allergies  Review of patient's allergies indicates no known allergies.  Home Medications   Current Outpatient Rx  Name Route Sig Dispense Refill  . ALLOPURINOL PO Oral Take by mouth.    . ALPRAZOLAM 0.5 MG PO TABS Oral  Take 1 tablet (0.5 mg total) by mouth 3 (three) times daily as needed for sleep. 30 tablet 3  . CHLOROTHIAZIDE PO  25 mg. Take 1/2 tablet daily.    Marland Kitchen CITALOPRAM HYDROBROMIDE 40 MG PO TABS Oral Take 1 tablet (40 mg total) by mouth daily. 90 tablet 0  . ESTRADIOL 1 MG PO TABS Oral Take 1 mg by mouth daily.    Marland Kitchen HYOSCYAMINE SULFATE 0.125 MG PO TABS Oral Take 1 tablet (0.125 mg total) by mouth every 4 (four) hours as needed for cramping. 30 tablet 0  . POTASSIUM CHLORIDE CRYS ER 20 MEQ PO TBCR Oral Take 1 tablet (20 mEq total) by mouth daily. 30 tablet 3  . POTASSIUM CHLORIDE CRYS ER 20 MEQ PO TBCR Oral Take 1 tablet (20 mEq total) by mouth daily. 30 tablet 3  . PROMETHAZINE HCL 25 MG PO TABS Oral Take 1 tablet (25 mg total) by mouth every 6 (six) hours as needed for nausea. 10 tablet 0  . ROSUVASTATIN CALCIUM 20 MG PO TABS Oral Take 1 tablet (20 mg total) by mouth daily. 30 tablet 1  . TRAMADOL HCL 50 MG PO TABS Oral Take 1 tablet (50 mg total) by mouth every 6 (six) hours as needed for pain. 45 tablet 1  . VALACYCLOVIR HCL 1 G PO TABS  2 tabs x1 dose.  Repeat in 12 hrs. 4 tablet 3  . ZOLPIDEM TARTRATE ER 12.5 MG PO TBCR Oral Take 1 tablet (12.5 mg total) by mouth at bedtime as needed for sleep. 30 tablet 0    BP 107/68  Pulse 110  Temp 98.2 F (36.8 C) (Oral)  Resp 20  Ht 5\' 4"  (1.626 m)  Wt 177 lb (80.287 kg)  BMI 30.38 kg/m2  SpO2 98%  Physical Exam  Nursing note and vitals reviewed. Constitutional: She appears well-developed and well-nourished. No distress.  HENT:  Head: Normocephalic and atraumatic.  Mouth/Throat: Oropharynx is clear and moist.  Cardiovascular: Normal rate, regular rhythm and normal heart sounds.   Pulmonary/Chest: Effort normal and breath sounds normal.  Abdominal: Soft. Bowel sounds are normal. She exhibits no distension and no mass. There is no tenderness. There is CVA tenderness. There is no rebound and no guarding.       Right CVA tenderness  Neurological:  She is alert.  Skin: Skin is warm and dry. She is not diaphoretic.  Psychiatric: She has a normal mood and affect.    ED Course  Procedures (including critical care time)  Labs Reviewed  URINALYSIS, ROUTINE W REFLEX MICROSCOPIC - Abnormal; Notable for the following:    Hgb urine dipstick LARGE (*)     All other components within normal limits  BASIC METABOLIC  PANEL - Abnormal; Notable for the following:    Glucose, Bld 100 (*)     GFR calc non Af Amer 62 (*)     GFR calc Af Amer 72 (*)     All other components within normal limits  URINE MICROSCOPIC-ADD ON - Abnormal; Notable for the following:    Squamous Epithelial / LPF FEW (*)     Bacteria, UA MANY (*)     All other components within normal limits   Ct Abdomen Pelvis Wo Contrast  08/14/2012  *RADIOLOGY REPORT*  Clinical Data: Right lower quadrant pain with dysuria and vaginal discharge for 1 day.  History urinary tract stones.  CT ABDOMEN AND PELVIS WITHOUT CONTRAST  Technique:  Multidetector CT imaging of the abdomen and pelvis was performed following the standard protocol without intravenous contrast.  Comparison: CT abdomen and pelvis 03/06/2012.  Findings: Mild dependent atelectasis is seen in the lung bases.  No pleural or pericardial effusion.  No renal or ureteral stones are seen on the right or left.  The kidneys are unremarkable.  The patient is status post cholecystectomy.  A 0.9 cm low attenuating lesion in the inferior right hepatic lobe is unchanged. Subtle nodularity of the liver border again noted.  The liver is otherwise unremarkable.  The spleen, adrenal glands, pancreas and biliary tree appear normal. The patient is status post hysterectomy.  The colon and appendix are unremarkable.  Surgical anastomoses of stomach and small bowel for gastric bypass again noted.  There is no lymphadenopathy or fluid.  No focal bony abnormality.  IMPRESSION:  1.  Negative for urinary tract stone or acute abnormality. 2.  Status post  cholecystectomy gastric bypass. 3.  Subtle nodularity the liver border suggestive of cirrhosis again seen.  Original Report Authenticated By: Bernadene Bell. Maricela Curet, M.D.     No diagnosis found.  4:01 PM Patient currently eating [employer].  She does not appear to be in any distress.  She states that her pain and nausea have improved.  MDM  Patient complaining of right flank pain and hematuria, which is similar to previous kidney stones.  CT ab/pelvis does not show any evidence of a kidney stone.  No signs of infection with UA.  Patient afebrile.  Pain and nausea improved while in the ED.  Patient discharged home.  Patient given follow up with Urology.          Pascal Lux [place], PA-C 08/15/12 0006  Pascal Lux [place], PA-C 08/15/12 0006

## 2012-08-14 NOTE — ED Notes (Signed)
D/c home with family- no new rx given 

## 2012-08-15 NOTE — ED Provider Notes (Signed)
Medical screening examination/treatment/procedure(s) were performed by non-physician practitioner and as supervising physician I was immediately available for consultation/collaboration.   Moreen Piggott, MD 08/15/12 0702 

## 2012-08-23 ENCOUNTER — Other Ambulatory Visit: Payer: Self-pay | Admitting: Family Medicine

## 2012-08-24 ENCOUNTER — Other Ambulatory Visit: Payer: Self-pay | Admitting: Family Medicine

## 2012-08-24 MED ORDER — ZOLPIDEM TARTRATE ER 12.5 MG PO TBCR: 12.5000 mg | EXTENDED_RELEASE_TABLET | Freq: Every evening | ORAL | Status: DC | PRN

## 2012-08-24 NOTE — Telephone Encounter (Signed)
Ok for #30, 3 refills 

## 2012-08-24 NOTE — Telephone Encounter (Signed)
Refill Zolpidem TART ER 12.5 MG Take 1 tablet (12.5 mg total) by mouth at bedtime as needed for sleep - last fill stated as 7.16.13  Chart shows last wrt 7.24.13 #30  Last ov 3.27.13 wt/[facility]  Current CPE sch. 9.26.13

## 2012-08-24 NOTE — Telephone Encounter (Signed)
.  rx faxed to pharmacy, manually.  

## 2012-08-24 NOTE — Telephone Encounter (Signed)
rx sent to pharmacy by e-script  

## 2012-08-24 NOTE — Telephone Encounter (Signed)
Last OV 03-23-12 acute with Alwyn Ren, upcoming CPE 09-22-12, last refill 07-20-12 #30 with 0 refills

## 2012-09-06 DIAGNOSIS — R109 Unspecified abdominal pain: Secondary | ICD-10-CM

## 2012-09-06 DIAGNOSIS — K7460 Unspecified cirrhosis of liver: Secondary | ICD-10-CM

## 2012-09-06 HISTORY — DX: Unspecified cirrhosis of liver: K74.60

## 2012-09-06 HISTORY — DX: Unspecified abdominal pain: R10.9

## 2012-09-22 ENCOUNTER — Ambulatory Visit (INDEPENDENT_AMBULATORY_CARE_PROVIDER_SITE_OTHER): Payer: Managed Care, Other (non HMO) | Admitting: Family Medicine

## 2012-09-22 ENCOUNTER — Encounter: Payer: Self-pay | Admitting: Family Medicine

## 2012-09-22 VITALS — BP 98/62 | HR 79 | Temp 98.2°F | Ht 65.0 in | Wt 191.8 lb

## 2012-09-22 DIAGNOSIS — E785 Hyperlipidemia, unspecified: Secondary | ICD-10-CM

## 2012-09-22 DIAGNOSIS — Z0000 Encounter for general adult medical examination without abnormal findings: Secondary | ICD-10-CM | POA: Insufficient documentation

## 2012-09-22 DIAGNOSIS — F329 Major depressive disorder, single episode, unspecified: Secondary | ICD-10-CM

## 2012-09-22 DIAGNOSIS — Z23 Encounter for immunization: Secondary | ICD-10-CM

## 2012-09-22 DIAGNOSIS — E119 Type 2 diabetes mellitus without complications: Secondary | ICD-10-CM

## 2012-09-22 DIAGNOSIS — Z9884 Bariatric surgery status: Secondary | ICD-10-CM

## 2012-09-22 MED ORDER — VENLAFAXINE HCL ER 75 MG PO CP24: 75.0000 mg | ORAL_CAPSULE | Freq: Every day | ORAL | Status: DC

## 2012-09-22 MED ORDER — ALPRAZOLAM 0.5 MG PO TABS: 0.5000 mg | ORAL_TABLET | Freq: Three times a day (TID) | ORAL | Status: DC | PRN

## 2012-09-22 NOTE — Patient Instructions (Addendum)
Follow up in 1 month to recheck mood STOP the celexa! START the Effexor daily in the morning We'll notify you of your lab results and make any changes if needed I definitely support the therapy! Call with any questions or concerns Hang in there!

## 2012-09-22 NOTE — Assessment & Plan Note (Signed)
Deteriorated due to situation w/ daughter.  Stop celexa.  Start Effexor.  Refill on xanax given.  Applauded counseling.  Will follow closely.

## 2012-09-22 NOTE — Assessment & Plan Note (Signed)
Chronic problem, tolerating statin w/out difficulty.  Check labs.  Adjust meds prn  

## 2012-09-22 NOTE — Progress Notes (Signed)
  Subjective:    Patient ID: [name], female    DOB: [birth year], 41 y.o.   MRN: [id number]  HPI CPE- GYN Tenny Craw).  Pt notes fatigue and persistently low BP.  Depression- pt reports she has 'broke down and saw therapist'.  Daughter (18) got engaged and was supposed to get married 04/2013, ran away from home and called pt indicating that wedding is Friday.  Having difficulty sleeping, feels that celexa is not working.   Review of Systems Patient reports no vision/ hearing changes, adenopathy,fever, weight change,  persistant/recurrent hoarseness , swallowing issues, chest pain, palpitations, edema, persistant/recurrent cough, hemoptysis, dyspnea (rest/exertional/paroxysmal nocturnal), gastrointestinal bleeding (melena, rectal bleeding), abdominal pain, significant heartburn, bowel changes, GU symptoms (dysuria, hematuria, incontinence), Gyn symptoms (abnormal  bleeding, pain),  syncope, focal weakness, memory loss, numbness & tingling, skin/hair/nail changes, abnormal bruising or bleeding.     Objective:   Physical Exam General Appearance:    Alert, cooperative, no distress, appears stated age  Head:    Normocephalic, without obvious abnormality, atraumatic  Eyes:    PERRL, conjunctiva/corneas clear, EOM's intact, fundi    benign, both eyes  Ears:    Normal TM's and external ear canals, both ears  Nose:   Nares normal, septum midline, mucosa normal, no drainage    or sinus tenderness  Throat:   Lips, mucosa, and tongue normal; teeth and gums normal  Neck:   Supple, symmetrical, trachea midline, no adenopathy;    Thyroid: no enlargement/tenderness/nodules  Back:     Symmetric, no curvature, ROM normal, no CVA tenderness  Lungs:     Clear to auscultation bilaterally, respirations unlabored  Chest Wall:    No tenderness or deformity   Heart:    Regular rate and rhythm, S1 and S2 normal, no murmur, rub   or gallop  Breast Exam:    Deferred to GYN  Abdomen:     Soft, non-tender, bowel  sounds active all four quadrants,    no masses, no organomegaly  Genitalia:    Deferred to GYN  Rectal:    Extremities:   Extremities normal, atraumatic, no cyanosis or edema  Pulses:   2+ and symmetric all extremities  Skin:   Skin color, texture, turgor normal, no rashes or lesions  Lymph nodes:   Cervical, supraclavicular, and axillary nodes normal  Neurologic:   CNII-XII intact, normal strength, sensation and reflexes    throughout          Assessment & Plan:

## 2012-09-22 NOTE — Assessment & Plan Note (Signed)
Check B12 level. 

## 2012-09-22 NOTE — Assessment & Plan Note (Signed)
Chronic problem, check labs.  Start meds prn.  Will follow closely.

## 2012-09-22 NOTE — Assessment & Plan Note (Signed)
PE WNL.  UTD on GYN.  Check labs.  Anticipatory guidance provided.  

## 2012-09-23 LAB — HEPATIC FUNCTION PANEL
ALT: 20 U/L (ref 0–35)
Albumin: 4.2 g/dL (ref 3.5–5.2)
Total Bilirubin: 0.4 mg/dL (ref 0.3–1.2)
Total Protein: 7.1 g/dL (ref 6.0–8.3)

## 2012-09-23 LAB — LIPID PANEL
Cholesterol: 204 mg/dL — ABNORMAL HIGH (ref 0–200)
VLDL: 16 mg/dL (ref 0.0–40.0)

## 2012-09-23 LAB — BASIC METABOLIC PANEL
BUN: 18 mg/dL (ref 6–23)
Chloride: 99 mEq/L (ref 96–112)
Creatinine, Ser: 0.9 mg/dL (ref 0.4–1.2)

## 2012-09-23 LAB — CBC WITH DIFFERENTIAL/PLATELET
Basophils Relative: 0.3 % (ref 0.0–3.0)
Eosinophils Absolute: 0.1 10*3/uL (ref 0.0–0.7)
Hemoglobin: 13.1 g/dL (ref 12.0–15.0)
Lymphocytes Relative: 44.4 % (ref 12.0–46.0)
MCHC: 33.2 g/dL (ref 30.0–36.0)
MCV: 90.5 fl (ref 78.0–100.0)
Neutro Abs: 2.3 10*3/uL (ref 1.4–7.7)
RBC: 4.36 Mil/uL (ref 3.87–5.11)

## 2012-09-23 LAB — TSH: TSH: 1.64 u[IU]/mL (ref 0.35–5.50)

## 2012-09-23 LAB — HEMOGLOBIN A1C: Hgb A1c MFr Bld: 5.8 % (ref 4.6–6.5)

## 2012-09-23 LAB — VITAMIN B12: Vitamin B-12: >1500 pg/mL — ABNORMAL HIGH (ref 211–911)

## 2012-09-23 LAB — LDL CHOLESTEROL, DIRECT: Direct LDL: 135 mg/dL

## 2012-09-26 LAB — VITAMIN D 1,25 DIHYDROXY: Vitamin D2 1, 25 (OH)2: 25 pg/mL

## 2012-10-10 ENCOUNTER — Other Ambulatory Visit: Payer: Self-pay | Admitting: Family Medicine

## 2012-10-10 NOTE — Telephone Encounter (Signed)
refills x 2 -- last ov 9.26.13 V70  1-Zolpidem Tartrate ER 12.5 MG Take 1 tablet (12.5 mg total) by mouth at bedtime as needed for sleep.-- Last fill 7.16.13 NOTE LAST WRT 8.28.13 #30 wt/3-refills  2-Crestor 20MG  tablet #30 wt/1-refills Take 1 Tablet Every Day -- last fill 8.12.13

## 2012-10-11 MED ORDER — ROSUVASTATIN CALCIUM 20 MG PO TABS: 20.0000 mg | ORAL_TABLET | Freq: Every day | ORAL | Status: DC

## 2012-10-11 NOTE — Telephone Encounter (Signed)
Noted pt last refill for Ambein 08-24-12 #30 with 3 refills thus too early for refill, noted declined at this time, crestor sent via escribe

## 2012-10-24 ENCOUNTER — Encounter: Payer: Self-pay | Admitting: Family Medicine

## 2012-10-24 ENCOUNTER — Ambulatory Visit (INDEPENDENT_AMBULATORY_CARE_PROVIDER_SITE_OTHER): Payer: Managed Care, Other (non HMO) | Admitting: Family Medicine

## 2012-10-24 VITALS — BP 130/80 | HR 80 | Temp 98.0°F | Ht 64.0 in | Wt 196.8 lb

## 2012-10-24 DIAGNOSIS — F329 Major depressive disorder, single episode, unspecified: Secondary | ICD-10-CM

## 2012-10-24 MED ORDER — VENLAFAXINE HCL ER 150 MG PO CP24: 150.0000 mg | ORAL_CAPSULE | Freq: Every day | ORAL | Status: DC

## 2012-10-24 NOTE — Patient Instructions (Addendum)
Double the Effexor to 150- take 2 of what you have and when you pick up the new prescription, back to 1 Call w/ any questions or concerns Hang in there!!!

## 2012-10-24 NOTE — Progress Notes (Signed)
  Subjective:    Patient ID: [name], female    DOB: 1971-09-19, 41 y.o.   MRN: [id number]  HPI Depression- ongoing issue.  Effexor was started at last visit.  Felt good x2 weeks, now less effective.  Not sleeping well.  Less tearful.  Daughter is now married and living w/ in-laws.  Is doing better about calling home.  Someone at church started rumor that pt has drug problem- this is obviously very upsetting to pt.   Review of Systems For ROS see HPI     Objective:   Physical Exam  Vitals reviewed. Constitutional: She is oriented to person, place, and time. She appears well-developed and well-nourished. No distress.  Neurological: She is alert and oriented to person, place, and time.  Psychiatric: She has a normal mood and affect. Her behavior is normal. Judgment and thought content normal.       Less anxious, not tearful today.  Able to speak about daughter calmly.          Assessment & Plan:

## 2012-10-24 NOTE — Assessment & Plan Note (Signed)
Some improvement since switching to Effexor but not ideal control of sxs.  Will increase to 150mg  and continue to follow.  Pt expressed understanding and is in agreement w/ plan.

## 2012-11-09 ENCOUNTER — Emergency Department (HOSPITAL_BASED_OUTPATIENT_CLINIC_OR_DEPARTMENT_OTHER)
Admission: EM | Admit: 2012-11-09 | Discharge: 2012-11-09 | Disposition: A | Discharge status: Home / Self Care | Payer: Managed Care, Other (non HMO) | Attending: Emergency Medicine | Admitting: Emergency Medicine

## 2012-11-09 ENCOUNTER — Encounter (HOSPITAL_BASED_OUTPATIENT_CLINIC_OR_DEPARTMENT_OTHER): Payer: Self-pay

## 2012-11-09 ENCOUNTER — Emergency Department (HOSPITAL_BASED_OUTPATIENT_CLINIC_OR_DEPARTMENT_OTHER): Payer: Managed Care, Other (non HMO)

## 2012-11-09 DIAGNOSIS — H538 Other visual disturbances: Secondary | ICD-10-CM | POA: Insufficient documentation

## 2012-11-09 DIAGNOSIS — E785 Hyperlipidemia, unspecified: Secondary | ICD-10-CM | POA: Insufficient documentation

## 2012-11-09 DIAGNOSIS — E119 Type 2 diabetes mellitus without complications: Secondary | ICD-10-CM | POA: Insufficient documentation

## 2012-11-09 DIAGNOSIS — R0602 Shortness of breath: Secondary | ICD-10-CM | POA: Insufficient documentation

## 2012-11-09 DIAGNOSIS — Z79899 Other long term (current) drug therapy: Secondary | ICD-10-CM | POA: Insufficient documentation

## 2012-11-09 DIAGNOSIS — Z87442 Personal history of urinary calculi: Secondary | ICD-10-CM | POA: Insufficient documentation

## 2012-11-09 DIAGNOSIS — N390 Urinary tract infection, site not specified: Secondary | ICD-10-CM | POA: Insufficient documentation

## 2012-11-09 DIAGNOSIS — Z8742 Personal history of other diseases of the female genital tract: Secondary | ICD-10-CM | POA: Insufficient documentation

## 2012-11-09 DIAGNOSIS — K7689 Other specified diseases of liver: Secondary | ICD-10-CM | POA: Insufficient documentation

## 2012-11-09 DIAGNOSIS — R319 Hematuria, unspecified: Secondary | ICD-10-CM | POA: Insufficient documentation

## 2012-11-09 DIAGNOSIS — R0789 Other chest pain: Secondary | ICD-10-CM | POA: Insufficient documentation

## 2012-11-09 LAB — COMPREHENSIVE METABOLIC PANEL
ALT: 21 U/L (ref 0–35)
AST: 30 U/L (ref 0–37)
Albumin: 4 g/dL (ref 3.5–5.2)
CO2: 31 mEq/L (ref 19–32)
Chloride: 99 mEq/L (ref 96–112)
GFR calc non Af Amer: 69 mL/min — ABNORMAL LOW (ref >90)
Sodium: 138 mEq/L (ref 135–145)
Total Bilirubin: 0.3 mg/dL (ref 0.3–1.2)

## 2012-11-09 LAB — CBC WITH DIFFERENTIAL/PLATELET
Basophils Absolute: 0 10*3/uL (ref 0.0–0.1)
Basophils Relative: 1 % (ref 0–1)
Lymphocytes Relative: 44 % (ref 12–46)
Neutro Abs: 2.5 10*3/uL (ref 1.7–7.7)
Neutrophils Relative %: 48 % (ref 43–77)
Platelets: 96 10*3/uL — ABNORMAL LOW (ref 150–400)
RDW: 12.3 % (ref 11.5–15.5)
WBC: 5.1 10*3/uL (ref 4.0–10.5)

## 2012-11-09 LAB — D-DIMER, QUANTITATIVE: D-Dimer, Quant: <0.27 ug/mL-FEU (ref 0.00–0.48)

## 2012-11-09 MED ORDER — ASPIRIN 81 MG PO CHEW
324.0000 mg | CHEWABLE_TABLET | Freq: Once | ORAL | Status: AC
  Administered 2012-11-09: 324 mg via ORAL
  Filled 2012-11-09: qty 4

## 2012-11-09 MED ORDER — MORPHINE SULFATE 4 MG/ML IJ SOLN
4.0000 mg | Freq: Once | INTRAMUSCULAR | Status: AC
  Administered 2012-11-09: 4 mg via INTRAVENOUS
  Filled 2012-11-09: qty 1

## 2012-11-09 NOTE — ED Provider Notes (Signed)
History     CSN: [id number]  Arrival date & time 11/09/12  1449   First MD Initiated Contact with Patient 11/09/12 1500      Chief Complaint  Patient presents with  . Chest Pain    (Consider location/radiation/quality/duration/timing/severity/associated sxs/prior treatment) Patient is a 41 y.o. female presenting with chest pain.  Chest Pain Primary symptoms include shortness of breath. Pertinent negatives for primary symptoms include no fever, no cough, no wheezing, no palpitations, no abdominal pain, no nausea, no vomiting and no dizziness.  Pertinent negatives for associated symptoms include no diaphoresis, no numbness and no weakness.    Pt p/w SOB worse when lying flat x 2-3 days and pressure like CP in substernal area since this afternoon. [place] is worse with deep breathing. No recent travel or surgeries. No fever chills, cough, URI sx, leg swelling or tenderness. No trauma or recent heavy lifting. + fam history of CAD. Pt seen in ED earlier this month with similar presentation. W/u negative for CAD and PE. Symptoms felt likely to anxiety/depression. Last stress test 3-4 years ago was normal.  Past Medical History  Diagnosis Date  . Hyperlipidemia   . Diabetes mellitus   . Kidney stones   . Recurrent UTI   . Non-alcoholic fatty liver disease   . Weight decrease   . Chills   . Fatigue     loss of sleep  . Dizziness   . Generalized headaches   . Wears glasses   . Blood in urine   . Liver disease   . Poor appetite   . Abdominal pain   . Blurred vision     when feeling very fatigued   . Ovarian cyst     Past Surgical History  Procedure Date  . Cholecystectomy 2005  . Kidney stones   . Gastric bypass 10/2010  . Tubal ligation     x3  . Abdominal hysterectomy 2008    for polycystic ovaries  . Cystostomy w/ bladder biopsy   . Exploratory laparotomy 09/01/11  . Laparoscopy 10/19/2011    Procedure: LAPAROSCOPY OPERATIVE;  Surgeon: Miguel Aschoff;  Location: [facility];   Service: Gynecology;  Laterality: N/A;  Operative Laparoscopy with Lysis Of Adhesions  . Laparotomy 10/19/2011    Procedure: LAPAROTOMY;  Surgeon: Miguel Aschoff;  Location: [facility];  Service: Gynecology;  Laterality: Bilateral;  Laparotomy With Bilateral Oophorectomy  . Bilateral salpingoophorectomy 2012    Dr Tenny Craw  . Colonoscopy     negative, done for pain  . Upper gastrointestinal endoscopy     to assess varices    Family History  Problem Relation Age of Onset  . Lung cancer Mother   . Cancer Mother     lung  . Hyperlipidemia Father   . Heart disease Father   . Stroke Father   . Hypertension Father   . Diabetes Father   . Hypertension Brother   . Other Brother     suicide    History  Substance Use Topics  . Smoking status: Never Smoker   . Smokeless tobacco: Not on file  . Alcohol Use: No    OB History    Grav Para Term Preterm Abortions TAB SAB Ect Mult Living                  Review of Systems  Constitutional: Negative for fever, chills and diaphoresis.  HENT: Negative for congestion, sore throat, sneezing, neck pain, neck stiffness and sinus pressure.   Respiratory: Positive for  shortness of breath. Negative for cough, wheezing and stridor.   Cardiovascular: Positive for chest pain. Negative for palpitations and leg swelling.  Gastrointestinal: Negative for nausea, vomiting, abdominal pain, diarrhea and constipation.  Musculoskeletal: Negative for myalgias and back pain.  Skin: Negative for rash and wound.  Neurological: Negative for dizziness, weakness, light-headedness, numbness and headaches.    Allergies  Review of patient's allergies indicates no known allergies.  Home Medications   Current Outpatient Rx  Name  Route  Sig  Dispense  Refill  . ALLOPURINOL PO   Oral   Take by mouth.         . ALPRAZOLAM 0.5 MG PO TABS   Oral   Take 1 tablet (0.5 mg total) by mouth 3 (three) times daily as needed for sleep.   30 tablet   3   . CHLOROTHIAZIDE  PO      25 mg. Take 1/2 tablet daily.         Marland Kitchen ESTRADIOL 1 MG PO TABS   Oral   Take 1 mg by mouth daily.         Marland Kitchen HYOSCYAMINE SULFATE 0.125 MG PO TABS      TAKE 1 TABLET BY MOUTH EVERY 4 HOURS AS NEEDED FOR CRAMPING   30 tablet   0   . POTASSIUM CHLORIDE CRYS ER 20 MEQ PO TBCR   Oral   Take 1 tablet (20 mEq total) by mouth daily.   30 tablet   3   . PROMETHAZINE HCL 25 MG PO TABS   Oral   Take 25 mg by mouth every 6 (six) hours as needed.         Marland Kitchen ROSUVASTATIN CALCIUM 20 MG PO TABS   Oral   Take 1 tablet (20 mg total) by mouth daily.   30 tablet   5   . TRAMADOL HCL 50 MG PO TABS   Oral   Take 1 tablet (50 mg total) by mouth every 6 (six) hours as needed for pain.   45 tablet   1   . VALACYCLOVIR HCL 1 G PO TABS      2 tabs x1 dose.  Repeat in 12 hrs.   4 tablet   3   . VENLAFAXINE HCL ER 150 MG PO CP24   Oral   Take 1 capsule (150 mg total) by mouth daily.   30 capsule   3   . ZOLPIDEM TARTRATE ER 12.5 MG PO TBCR   Oral   Take 12.5 mg by mouth at bedtime as needed.           BP 103/56  Pulse 87  Temp 98.5 F (36.9 C) (Oral)  Resp 16  Ht 5\' 3"  (1.6 m)  Wt 195 lb (88.451 kg)  BMI 34.54 kg/m2  SpO2 100%  Physical Exam  Nursing note and vitals reviewed. Constitutional: She is oriented to person, place, and time. She appears well-developed and well-nourished. No distress.  HENT:  Head: Normocephalic and atraumatic.  Mouth/Throat: Oropharynx is clear and moist.  Eyes: EOM are normal. Pupils are equal, round, and reactive to light.  Neck: Normal range of motion. Neck supple.  Cardiovascular: Normal rate and regular rhythm.  Exam reveals no gallop and no friction rub.   No murmur heard. Pulmonary/Chest: Breath sounds normal. No respiratory distress. She has no wheezes. She has no rales. She exhibits no tenderness.       Pt taking shallow breaths  Abdominal: Soft. Bowel sounds are normal.  She exhibits no distension and no mass. There is  no tenderness. There is no rebound and no guarding.  Musculoskeletal: Normal range of motion. She exhibits no edema and no tenderness.       No calf swelling or tenderness  Neurological: She is alert and oriented to person, place, and time.  Skin: Skin is warm and dry. No rash noted. No erythema.  Psychiatric:       Flat affect    ED Course  Procedures (including critical care time)  Labs Reviewed  CBC WITH DIFFERENTIAL - Abnormal; Notable for the following:    Platelets 96 (*)  CONSISTENT WITH PREVIOUS RESULT   All other components within normal limits  COMPREHENSIVE METABOLIC PANEL - Abnormal; Notable for the following:    Glucose, Bld 127 (*)     GFR calc non Af Amer 69 (*)     GFR calc Af Amer 81 (*)     All other components within normal limits  TROPONIN I  D-DIMER, QUANTITATIVE  TROPONIN I   Dg Chest 2 View  11/09/2012  *RADIOLOGY REPORT*  Clinical Data: Anterior chest pain  CHEST - 2 VIEW  Comparison: 02/12/2012  Findings: Lungs are essentially clear.  No pleural effusion or pneumothorax.  Cardiomediastinal silhouette is within normal limits.  Mild degenerative changes of the visualized thoracolumbar spine.  IMPRESSION: No evidence of acute cardiopulmonary disease.   Original Report Authenticated By: Charline Bills, M.D.      1. Atypical chest pain      Date: 11/09/2012  Rate:83  Rhythm: normal sinus rhythm  QRS Axis: normal  Intervals: normal  ST/T Wave abnormalities: normal  Conduction Disutrbances:none  Narrative Interpretation:   Old EKG Reviewed: none available    MDM  Pt states she is feeling better after meds. States she recently had a CT for kidney stones that showed small amount of fluid around the heart. Reviewing pt previous CT's in our system demonstrated she has had greater than 10 in the last 3 years. Most of them was for r/o renal stone. I reviewed the finding of most of these and no stones have been present in any of the CT's. CT in august with  no renal stones or pericardial fluid. I have counseled the pt about the dangers of excessive radiation and that renal stones are not the source of her chronic flank pain.   Bedside US showed small amount of fluid in pericardial space. No tamponading effect. Pt with normal EKG, trop, d-dimer. Will recheck trop and refer to cardiology for f/u.   Neg trop x 2. Do not believe pt symptoms are related to CAD or other medical emergency. Will d/c to f/u lwith cardiology. Pt instructed to return immediately for worsening pain or any concerns      Loren Racer, MD 11/09/12 1950

## 2012-11-09 NOTE — ED Notes (Signed)
C/o SOB x 3-4 days-CP stared while seated at work today approx 12pm-NAD at present

## 2012-11-28 ENCOUNTER — Other Ambulatory Visit: Payer: Self-pay | Admitting: Family Medicine

## 2012-11-28 MED ORDER — VALACYCLOVIR HCL 1 G PO TABS: ORAL_TABLET | ORAL | Status: DC

## 2012-11-28 NOTE — Telephone Encounter (Signed)
VALTREX 1 GM TABLET QTY:4 LAST FILL: 10.4.13 TAKE 2 TABLETS BY MOHTH FOR 1 DOSE, THEN REPEAT IN 12 HOURS

## 2012-11-28 NOTE — Telephone Encounter (Signed)
Rx sent 

## 2012-11-29 ENCOUNTER — Encounter: Payer: Self-pay | Admitting: Family Medicine

## 2012-11-29 ENCOUNTER — Ambulatory Visit (INDEPENDENT_AMBULATORY_CARE_PROVIDER_SITE_OTHER): Payer: Managed Care, Other (non HMO) | Admitting: Family Medicine

## 2012-11-29 VITALS — BP 104/68 | HR 98 | Temp 98.3°F | Ht 64.75 in | Wt 205.8 lb

## 2012-11-29 DIAGNOSIS — I319 Disease of pericardium, unspecified: Secondary | ICD-10-CM

## 2012-11-29 DIAGNOSIS — I3139 Other pericardial effusion (noninflammatory): Secondary | ICD-10-CM | POA: Insufficient documentation

## 2012-11-29 DIAGNOSIS — R0789 Other chest pain: Secondary | ICD-10-CM | POA: Insufficient documentation

## 2012-11-29 DIAGNOSIS — R5381 Other malaise: Secondary | ICD-10-CM

## 2012-11-29 DIAGNOSIS — I313 Pericardial effusion (noninflammatory): Secondary | ICD-10-CM

## 2012-11-29 DIAGNOSIS — R5383 Other fatigue: Secondary | ICD-10-CM

## 2012-11-29 HISTORY — DX: Pericardial effusion (noninflammatory): I31.3

## 2012-11-29 HISTORY — DX: Other pericardial effusion (noninflammatory): I31.39

## 2012-11-29 LAB — CBC WITH DIFFERENTIAL/PLATELET
Basophils Absolute: 0 10*3/uL (ref 0.0–0.1)
Basophils Relative: 0.9 % (ref 0.0–3.0)
Eosinophils Relative: 0.6 % (ref 0.0–5.0)
HCT: 38.7 % (ref 36.0–46.0)
Hemoglobin: 13.3 g/dL (ref 12.0–15.0)
Lymphocytes Relative: 43 % (ref 12.0–46.0)
Lymphs Abs: 1.8 10*3/uL (ref 0.7–4.0)
Monocytes Relative: 6.6 % (ref 3.0–12.0)
Neutro Abs: 2 10*3/uL (ref 1.4–7.7)
RBC: 4.37 Mil/uL (ref 3.87–5.11)
WBC: 4.1 10*3/uL — ABNORMAL LOW (ref 4.5–10.5)

## 2012-11-29 LAB — SEDIMENTATION RATE: Sed Rate: 14 mm/hr (ref 0–22)

## 2012-11-29 LAB — TSH: TSH: 2.02 u[IU]/mL (ref 0.35–5.50)

## 2012-11-29 NOTE — Progress Notes (Signed)
  Subjective:    Patient ID: [name], female    DOB: [birth year], 41 y.o.   MRN: [id number]  HPI ER f/u- went to ER on 11/13 for CP.  Had CT at [facility] on 11/5 that showed ? Pericardial effusion.  Had bedside US in ER that showed small amount of pericardial fluid.  Was told by Uro to inquire about possible lupus after reviewing chart.  Pt currently is feeling well w/ exception of fatigue.  Still having central CP radiating to R neck and shoulders.  Described as 'achey- almost flu like'.  + SOB.  No fevers.  + knee pain, body aches.  'i feel swollen'.   Review of Systems For ROS see HPI     Objective:   Physical Exam  Constitutional: She is oriented to person, place, and time. She appears well-developed and well-nourished. No distress.  HENT:  Head: Normocephalic and atraumatic.  Eyes: Conjunctivae normal and EOM are normal. Pupils are equal, round, and reactive to light.  Neck: Normal range of motion. Neck supple. No thyromegaly present.  Cardiovascular: Normal rate, regular rhythm, normal heart sounds and intact distal pulses.   No murmur heard. Pulmonary/Chest: Effort normal and breath sounds normal. No respiratory distress.  Abdominal: Soft. She exhibits no distension. There is no tenderness.  Musculoskeletal: She exhibits tenderness (over multiple trigger points diffusely). She exhibits no edema.  Lymphadenopathy:    She has no cervical adenopathy.  Neurological: She is alert and oriented to person, place, and time.  Skin: Skin is warm and dry.  Psychiatric: She has a normal mood and affect. Her behavior is normal.          Assessment & Plan:

## 2012-11-29 NOTE — Patient Instructions (Addendum)
We'll notify you of your lab results and make any changes if needed We'll call you with your rheumatology appt Call with any questions or concerns Happy Holidays!!!

## 2012-12-02 ENCOUNTER — Other Ambulatory Visit: Payer: Self-pay | Admitting: Family Medicine

## 2012-12-02 NOTE — Telephone Encounter (Signed)
VENLAFAXINE HCL ER 75 MG CAPSULES QTY:90 TAKE 1 CAPSULE EVERYDAY

## 2012-12-02 NOTE — Telephone Encounter (Signed)
Spoke to pharmacy Pt has Rx on file can disregard request.

## 2012-12-08 ENCOUNTER — Telehealth: Payer: Self-pay | Admitting: Family Medicine

## 2012-12-08 DIAGNOSIS — G479 Sleep disorder, unspecified: Secondary | ICD-10-CM

## 2012-12-08 NOTE — Telephone Encounter (Signed)
Ok for #30, will need to sign controlled substance agreement and get UDS.  Needs to pick up script

## 2012-12-08 NOTE — Telephone Encounter (Signed)
Refill: Alprazolam 0.5 mg tablet. Take 1 tablet 3 times a day as needed for sleep. Qty 30. Last fill 11-18-12

## 2012-12-08 NOTE — Telephone Encounter (Signed)
Please advise on RF request.//AB/CMA 

## 2012-12-09 ENCOUNTER — Encounter: Payer: Self-pay | Admitting: Lab

## 2012-12-09 ENCOUNTER — Ambulatory Visit (INDEPENDENT_AMBULATORY_CARE_PROVIDER_SITE_OTHER): Payer: Managed Care, Other (non HMO) | Admitting: Family Medicine

## 2012-12-09 ENCOUNTER — Other Ambulatory Visit: Payer: Self-pay | Admitting: *Deleted

## 2012-12-09 ENCOUNTER — Encounter: Payer: Self-pay | Admitting: Family Medicine

## 2012-12-09 VITALS — BP 118/70 | HR 97 | Temp 98.1°F | Ht 64.75 in | Wt 208.8 lb

## 2012-12-09 DIAGNOSIS — J329 Chronic sinusitis, unspecified: Secondary | ICD-10-CM

## 2012-12-09 MED ORDER — ALPRAZOLAM 0.5 MG PO TABS: 0.5000 mg | ORAL_TABLET | Freq: Three times a day (TID) | ORAL | Status: DC | PRN

## 2012-12-09 MED ORDER — AMOXICILLIN 875 MG PO TABS: 875.0000 mg | ORAL_TABLET | Freq: Two times a day (BID) | ORAL | Status: DC

## 2012-12-09 NOTE — Telephone Encounter (Signed)
Left message to call office to see if Pt had Rx. Spoke with Dr Beverely Low who indicated that Rx was given to Pt at OV today. Pt can disregard call.

## 2012-12-09 NOTE — Progress Notes (Signed)
  Subjective:    Patient ID: [name], female    DOB: 1971-09-24, 41 y.o.   MRN: [id number]  HPI URI- sxs started Monday w/ diarrhea and vomiting.  sxs resolved spontaneously.  Yesterday had severe nasal and head congestion- 'it felt like my head was going to explode'.  L ear pain.  + hoarseness.  Minimal cough.  + tooth pain on L.  + sore throat.  Tm 102 Wednesday.  No known sick contacts.   Review of Systems For ROS see HPI     Objective:   Physical Exam  Constitutional: She appears well-developed and well-nourished. No distress.  HENT:  Head: Normocephalic and atraumatic.  Right Ear: Tympanic membrane normal.  Left Ear: Tympanic membrane normal.  Nose: Mucosal edema and rhinorrhea present. Right sinus exhibits no maxillary sinus tenderness and no frontal sinus tenderness. Left sinus exhibits maxillary sinus tenderness and frontal sinus tenderness.  Mouth/Throat: Uvula is midline and mucous membranes are normal. Posterior oropharyngeal erythema present. No oropharyngeal exudate.  Eyes: Conjunctivae normal and EOM are normal. Pupils are equal, round, and reactive to light.  Neck: Normal range of motion. Neck supple.  Cardiovascular: Normal rate, regular rhythm and normal heart sounds.   Pulmonary/Chest: Effort normal and breath sounds normal. No respiratory distress. She has no wheezes.  Lymphadenopathy:    She has no cervical adenopathy.          Assessment & Plan:

## 2012-12-09 NOTE — Patient Instructions (Addendum)
This appears to be an early sinus infection Start the Amox twice daily- take w/ food Drink plenty of fluids REST! Hang in there! Happy Holidays!!!

## 2012-12-09 NOTE — Telephone Encounter (Signed)
Refill for Alprazolam 0.5mg  #30 tab printed and ready for pt to p/u. Must sign agreement and submit to UDS.

## 2012-12-11 NOTE — Assessment & Plan Note (Signed)
New.  Had [place] to assess.  No obvious PE abnormalities today.  Refer to rheum- if pt again symptomatic will refer to cards.  Pt expressed understanding and is in agreement w/ plan.

## 2012-12-11 NOTE — Assessment & Plan Note (Signed)
New.  Appears early infxn but given facial and tooth pain, seems to be bacterial in nature.  Start abx.  Reviewed supportive care and red flags that should prompt return.  Pt expressed understanding and is in agreement w/ plan.

## 2012-12-11 NOTE — Assessment & Plan Note (Signed)
Ongoing issue for pt w/out obvious cause.  Now coupled w/ small pericardial effusion it is suspicious for rheumatologic process.  Check labs.  Refer to rheum for complete evaluation and possible tx.  Pt expressed understanding and is in agreement w/ plan.

## 2012-12-11 NOTE — Assessment & Plan Note (Signed)
New.  Pt had normal cardiac w/u in ER.  sxs may be related to rheum process or more simply- musculoskeletal.  NSAIDs prn.  Reviewed supportive care and red flags that should prompt return.  Pt expressed understanding and is in agreement w/ plan.

## 2012-12-13 ENCOUNTER — Telehealth: Payer: Self-pay | Admitting: Family Medicine

## 2012-12-13 NOTE — Telephone Encounter (Signed)
Please advise on RF request.//AB/CMA 

## 2012-12-13 NOTE — Telephone Encounter (Signed)
Refill: Zolpidem tart er 12.5 mg tab. Take 1 tablet at bedtime as needed for sleep.

## 2012-12-14 NOTE — Telephone Encounter (Signed)
Ok for #30, 1 refill 

## 2012-12-15 MED ORDER — ZOLPIDEM TARTRATE ER 12.5 MG PO TBCR: 12.5000 mg | EXTENDED_RELEASE_TABLET | Freq: Every evening | ORAL | Status: DC | PRN

## 2012-12-15 NOTE — Telephone Encounter (Signed)
RX was faxed to pharmacy//AB/CMA

## 2012-12-26 ENCOUNTER — Telehealth: Payer: Self-pay | Admitting: *Deleted

## 2012-12-26 NOTE — Telephone Encounter (Signed)
Pt was seen on 12/13 and given 10 days of meds.  She should've been done w/ abx x1 week at this point.  Dentists are able to prescribe antibiotics.  If her dentist is concerned, they need to write the prescription.

## 2012-12-26 NOTE — Telephone Encounter (Signed)
Currently being treated for sinus infection with amoxicillin and dentist feels that infection is into gumline. Wants stronger antibiotic called in, please advise.

## 2012-12-29 ENCOUNTER — Encounter: Payer: Self-pay | Admitting: Lab

## 2012-12-30 ENCOUNTER — Ambulatory Visit: Payer: Managed Care, Other (non HMO) | Admitting: Family Medicine

## 2013-01-02 ENCOUNTER — Encounter: Payer: Self-pay | Admitting: Family Medicine

## 2013-01-03 ENCOUNTER — Telehealth: Payer: Self-pay | Admitting: *Deleted

## 2013-01-03 DIAGNOSIS — F329 Major depressive disorder, single episode, unspecified: Secondary | ICD-10-CM

## 2013-01-03 MED ORDER — BUPROPION HCL ER (XL) 150 MG PO TB24: 150.0000 mg | ORAL_TABLET | Freq: Every day | ORAL | Status: DC

## 2013-01-03 NOTE — Telephone Encounter (Signed)
Rx for wellbutrin xl sent patients pharmacy, #30 with 3 refills. Needs ov in 1 month. lmovm on pts cellphone.

## 2013-01-03 NOTE — Telephone Encounter (Signed)
Patient sent message via my chart states she is still depressed and crying a lot. Wants to know if medication needs to be changed or if she needs an ov. Please advise.

## 2013-01-03 NOTE — Telephone Encounter (Signed)
We can add Wellbutrin XL 150mg , (#30, 3 refills) daily in addition to her Effexor but she really needs to start counseling for all that's going on.  Needs OV in 1 month to recheck mood and discuss meds

## 2013-01-25 ENCOUNTER — Encounter (HOSPITAL_BASED_OUTPATIENT_CLINIC_OR_DEPARTMENT_OTHER): Payer: Self-pay

## 2013-01-25 ENCOUNTER — Emergency Department (HOSPITAL_BASED_OUTPATIENT_CLINIC_OR_DEPARTMENT_OTHER)
Admission: EM | Admit: 2013-01-25 | Discharge: 2013-01-25 | Disposition: A | Discharge status: Home / Self Care | Payer: Managed Care, Other (non HMO) | Attending: Emergency Medicine | Admitting: Emergency Medicine

## 2013-01-25 DIAGNOSIS — W010XXA Fall on same level from slipping, tripping and stumbling without subsequent striking against object, initial encounter: Secondary | ICD-10-CM | POA: Insufficient documentation

## 2013-01-25 DIAGNOSIS — S0993XA Unspecified injury of face, initial encounter: Secondary | ICD-10-CM | POA: Insufficient documentation

## 2013-01-25 DIAGNOSIS — Z87448 Personal history of other diseases of urinary system: Secondary | ICD-10-CM | POA: Insufficient documentation

## 2013-01-25 DIAGNOSIS — Z87440 Personal history of urinary (tract) infections: Secondary | ICD-10-CM | POA: Insufficient documentation

## 2013-01-25 DIAGNOSIS — IMO0002 Reserved for concepts with insufficient information to code with codable children: Secondary | ICD-10-CM | POA: Insufficient documentation

## 2013-01-25 DIAGNOSIS — E785 Hyperlipidemia, unspecified: Secondary | ICD-10-CM | POA: Insufficient documentation

## 2013-01-25 DIAGNOSIS — Z8719 Personal history of other diseases of the digestive system: Secondary | ICD-10-CM | POA: Insufficient documentation

## 2013-01-25 DIAGNOSIS — S199XXA Unspecified injury of neck, initial encounter: Secondary | ICD-10-CM | POA: Insufficient documentation

## 2013-01-25 DIAGNOSIS — M549 Dorsalgia, unspecified: Secondary | ICD-10-CM

## 2013-01-25 DIAGNOSIS — Z8679 Personal history of other diseases of the circulatory system: Secondary | ICD-10-CM | POA: Insufficient documentation

## 2013-01-25 DIAGNOSIS — E119 Type 2 diabetes mellitus without complications: Secondary | ICD-10-CM | POA: Insufficient documentation

## 2013-01-25 DIAGNOSIS — Z8742 Personal history of other diseases of the female genital tract: Secondary | ICD-10-CM | POA: Insufficient documentation

## 2013-01-25 DIAGNOSIS — Z87442 Personal history of urinary calculi: Secondary | ICD-10-CM | POA: Insufficient documentation

## 2013-01-25 DIAGNOSIS — Z79899 Other long term (current) drug therapy: Secondary | ICD-10-CM | POA: Insufficient documentation

## 2013-01-25 DIAGNOSIS — Y929 Unspecified place or not applicable: Secondary | ICD-10-CM | POA: Insufficient documentation

## 2013-01-25 DIAGNOSIS — W19XXXA Unspecified fall, initial encounter: Secondary | ICD-10-CM

## 2013-01-25 DIAGNOSIS — Y9329 Activity, other involving ice and snow: Secondary | ICD-10-CM | POA: Insufficient documentation

## 2013-01-25 MED ORDER — OXYCODONE-ACETAMINOPHEN 5-325 MG PO TABS
2.0000 | ORAL_TABLET | Freq: Once | ORAL | Status: AC
  Administered 2013-01-25: 2 via ORAL
  Filled 2013-01-25 (×2): qty 2

## 2013-01-25 MED ORDER — KETOROLAC TROMETHAMINE 60 MG/2ML IM SOLN
60.0000 mg | Freq: Once | INTRAMUSCULAR | Status: AC
  Administered 2013-01-25: 60 mg via INTRAMUSCULAR
  Filled 2013-01-25: qty 2

## 2013-01-25 MED ORDER — OXYCODONE-ACETAMINOPHEN 5-325 MG PO TABS: 2.0000 | ORAL_TABLET | ORAL | Status: DC | PRN

## 2013-01-25 MED ORDER — CYCLOBENZAPRINE HCL 10 MG PO TABS: 10.0000 mg | ORAL_TABLET | Freq: Two times a day (BID) | ORAL | Status: DC | PRN

## 2013-01-25 NOTE — ED Notes (Signed)
Pt reports she slipped on ice PTA and now has left shoulder pain, neck and low back pain.

## 2013-01-25 NOTE — ED Provider Notes (Signed)
History     CSN: [id number]  Arrival date & time 01/25/13  1226   First MD Initiated Contact with Patient 01/25/13 1231      Chief Complaint  Patient presents with  . Fall  . Shoulder Injury  . Back Pain  . Neck Pain    (Consider location/radiation/quality/duration/timing/severity/associated sxs/prior treatment) HPI Comments: Patient is a 42 year old female who presents with back, neck and shoulder pain after falling about 30 minutes ago. Patient reports sudden onset of aching pain that is severe in her left shoulder, low back and left neck. The pain does not radiate and is worse with movement and palpation. Patient has not tried anything for pain. No associated symptoms. Patient reports being able to ambulate and move extremities after fall.   Patient is a 42 y.o. female presenting with fall, shoulder injury, back pain, and neck pain.  Fall  Shoulder Injury Associated symptoms include neck pain.  Back Pain   Neck Pain     Past Medical History  Diagnosis Date  . Hyperlipidemia   . Diabetes mellitus   . Kidney stones   . Recurrent UTI   . Non-alcoholic fatty liver disease   . Weight decrease   . Chills   . Fatigue     loss of sleep  . Dizziness   . Generalized headaches   . Wears glasses   . Blood in urine   . Liver disease   . Poor appetite   . Abdominal pain   . Blurred vision     when feeling very fatigued   . Ovarian cyst     Past Surgical History  Procedure Date  . Cholecystectomy 2005  . Kidney stones   . Gastric bypass 10/2010  . Tubal ligation     x3  . Abdominal hysterectomy 2008    for polycystic ovaries  . Cystostomy w/ bladder biopsy   . Exploratory laparotomy 09/01/11  . Laparoscopy 10/19/2011    Procedure: LAPAROSCOPY OPERATIVE;  Surgeon: Miguel Aschoff;  Location: [facility];  Service: Gynecology;  Laterality: N/A;  Operative Laparoscopy with Lysis Of Adhesions  . Laparotomy 10/19/2011    Procedure: LAPAROTOMY;  Surgeon: Miguel Aschoff;  Location:  [facility];  Service: Gynecology;  Laterality: Bilateral;  Laparotomy With Bilateral Oophorectomy  . Bilateral salpingoophorectomy 2012    Dr Tenny Craw  . Colonoscopy     negative, done for pain  . Upper gastrointestinal endoscopy     to assess varices    Family History  Problem Relation Age of Onset  . Lung cancer Mother   . Cancer Mother     lung  . Hyperlipidemia Father   . Heart disease Father   . Stroke Father   . Hypertension Father   . Diabetes Father   . Hypertension Brother   . Other Brother     suicide    History  Substance Use Topics  . Smoking status: Never Smoker   . Smokeless tobacco: Not on file  . Alcohol Use: No    OB History    Grav Para Term Preterm Abortions TAB SAB Ect Mult Living                  Review of Systems  HENT: Positive for neck pain.   Musculoskeletal: Positive for back pain.  All other systems reviewed and are negative.    Allergies  Review of patient's allergies indicates no known allergies.  Home Medications   Current Outpatient Rx  Name  Route  Sig  Dispense  Refill  . ALLOPURINOL PO   Oral   Take by mouth.         . ALPRAZOLAM 0.5 MG PO TABS   Oral   Take 1 tablet (0.5 mg total) by mouth 3 (three) times daily as needed for sleep.   30 tablet   3   . AMOXICILLIN 875 MG PO TABS   Oral   Take 1 tablet (875 mg total) by mouth 2 (two) times daily.   20 tablet   0   . BUPROPION HCL ER (XL) 150 MG PO TB24   Oral   Take 1 tablet (150 mg total) by mouth daily.   30 tablet   3   . CHLOROTHIAZIDE PO      25 mg. Take 1/2 tablet daily.         Marland Kitchen ESTRADIOL 1 MG PO TABS   Oral   Take 1 mg by mouth daily.         Marland Kitchen HYOSCYAMINE SULFATE 0.125 MG PO TABS      TAKE 1 TABLET BY MOUTH EVERY 4 HOURS AS NEEDED FOR CRAMPING   30 tablet   0   . POTASSIUM CHLORIDE CRYS ER 20 MEQ PO TBCR   Oral   Take 1 tablet (20 mEq total) by mouth daily.   30 tablet   3   . PROMETHAZINE HCL 25 MG PO TABS   Oral   Take 25 mg by  mouth every 6 (six) hours as needed.         Marland Kitchen ROSUVASTATIN CALCIUM 20 MG PO TABS   Oral   Take 1 tablet (20 mg total) by mouth daily.   30 tablet   5   . TRAMADOL HCL 50 MG PO TABS   Oral   Take 1 tablet (50 mg total) by mouth every 6 (six) hours as needed for pain.   45 tablet   1   . VALACYCLOVIR HCL 1 G PO TABS      2 tabs x1 dose.  Repeat in 12 hrs.   4 tablet   0   . VENLAFAXINE HCL ER 150 MG PO CP24   Oral   Take 1 capsule (150 mg total) by mouth daily.   30 capsule   3   . ZOLPIDEM TARTRATE ER 12.5 MG PO TBCR   Oral   Take 1 tablet (12.5 mg total) by mouth at bedtime as needed.   30 tablet   1     BP 137/71  Pulse 94  Temp 98.4 F (36.9 C) (Oral)  Resp 18  Ht 5\' 3"  (1.6 m)  Wt 213 lb (96.616 kg)  BMI 37.73 kg/m2  SpO2 100%  Physical Exam  Nursing note and vitals reviewed. Constitutional: She is oriented to person, place, and time. She appears well-developed and well-nourished. No distress.  HENT:  Head: Normocephalic and atraumatic.  Eyes: Conjunctivae normal and EOM are normal.  Neck: Normal range of motion. Neck supple.  Cardiovascular: Normal rate and regular rhythm.  Exam reveals no gallop and no friction rub.   No murmur heard. Pulmonary/Chest: Effort normal and breath sounds normal. She has no wheezes. She has no rales. She exhibits no tenderness.  Abdominal: Soft. There is no tenderness.  Musculoskeletal: Normal range of motion.       Left upper back tender to palpation. Sacral area tender to palpation. No bruising or obvious deformity noted.   Neurological: She is alert and  oriented to person, place, and time. Coordination normal.       Strength and sensation equal and intact bilaterally. Speech is goal-oriented. Moves limbs without ataxia.   Skin: Skin is warm and dry.  Psychiatric: She has a normal mood and affect. Her behavior is normal.    ED Course  Procedures (including critical care time)  Labs Reviewed - No data to  display No results found.   1. Fall   2. Back pain       MDM  1:00 PM Patient given Percocet and toradol here for pain. Patient refused any imaging. Patient instructed to ice injuries and take Percocet and Flexeril as needed for pain. Patient instructed to return with worsening or concerning symptoms.         Emilia Beck, PA-C 01/25/13 1314

## 2013-01-26 NOTE — ED Provider Notes (Signed)
Medical screening examination/treatment/procedure(s) were performed by non-physician practitioner and as supervising physician I was immediately available for consultation/collaboration.  Latrisha Coiro, MD 01/26/13 0709 

## 2013-02-01 ENCOUNTER — Ambulatory Visit: Payer: Managed Care, Other (non HMO) | Admitting: Family Medicine

## 2013-02-06 ENCOUNTER — Encounter: Payer: Self-pay | Admitting: Family Medicine

## 2013-02-06 ENCOUNTER — Ambulatory Visit (INDEPENDENT_AMBULATORY_CARE_PROVIDER_SITE_OTHER): Payer: Managed Care, Other (non HMO) | Admitting: Family Medicine

## 2013-02-06 VITALS — BP 100/60 | HR 102 | Temp 98.2°F | Ht 64.75 in | Wt 215.4 lb

## 2013-02-06 DIAGNOSIS — M5380 Other specified dorsopathies, site unspecified: Secondary | ICD-10-CM

## 2013-02-06 DIAGNOSIS — M62830 Muscle spasm of back: Secondary | ICD-10-CM | POA: Insufficient documentation

## 2013-02-06 HISTORY — DX: Muscle spasm of back: M62.830

## 2013-02-06 MED ORDER — NAPROXEN 500 MG PO TABS: 500.0000 mg | ORAL_TABLET | Freq: Two times a day (BID) | ORAL | Status: DC

## 2013-02-06 NOTE — Patient Instructions (Addendum)
This is a paraspinal muscle spasm Start the Naproxen twice daily for 7-10 days (take w/ food) and then as needed HEAT! Use the flexeril at night- will make you sleepy Hang in there!!!

## 2013-02-06 NOTE — Progress Notes (Signed)
  Subjective:    Patient ID: [name], female    DOB: September 30, 1971, 42 y.o.   MRN: [id number]  HPI Back pain- fell 2 weeks ago in the snow, legs went out from under her and she landed on her bottom.  Previously was having pain in the tail bone, now pain is bandlike across lower back.  Pt went to [facility] after fall and was given percocet and flexeril 'which made me sleep for 3 days'.  Stopped flexeril due to fatigue.  Started ibuprofen w/out relief.  Pain remains localized to low back- no radiation into butt or legs.  No weakness or numbness.  No changes to bowel or bladder habits.   Review of Systems For ROS see HPI     Objective:   Physical Exam  Vitals reviewed. Constitutional: She is oriented to person, place, and time. She appears well-developed and well-nourished. No distress.  Musculoskeletal:  - SLR bilaterally No pain w/ extension, discomfort w/ forward flexion + lumbar paraspinal spasm bilaterally No bony tenderness over spine  Neurological: She is alert and oriented to person, place, and time. She has normal reflexes. No cranial nerve deficit. Coordination normal.          Assessment & Plan:

## 2013-02-07 ENCOUNTER — Telehealth: Payer: Self-pay | Admitting: *Deleted

## 2013-02-07 ENCOUNTER — Encounter: Payer: Self-pay | Admitting: Family Medicine

## 2013-02-07 ENCOUNTER — Telehealth: Payer: Self-pay | Admitting: Family Medicine

## 2013-02-07 MED ORDER — CYCLOBENZAPRINE HCL 10 MG PO TABS: 10.0000 mg | ORAL_TABLET | Freq: Two times a day (BID) | ORAL | Status: DC | PRN

## 2013-02-07 NOTE — Telephone Encounter (Signed)
Fax received from [facility] requesting refill on Alprazolam, last filled there 01/23/13, last OV here 02/06/13. Okay to refill, please advise.

## 2013-02-07 NOTE — Telephone Encounter (Signed)
Rx sent left Pt detail VM.

## 2013-02-07 NOTE — Assessment & Plan Note (Signed)
New.  Start scheduled NSAIDs and flexeril at night.  Heat.  Reviewed supportive care and red flags that should prompt return.  Pt expressed understanding and is in agreement w/ plan.

## 2013-02-07 NOTE — Telephone Encounter (Signed)
Ok to refill x 1  

## 2013-02-07 NOTE — Telephone Encounter (Signed)
Pt left VM that she only has 1 tab of the flexeril left but was told to use it at night. Pt states that she will need new Rx if she needs to continue with this med. Pt seen on yesterday.Please advise

## 2013-02-07 NOTE — Telephone Encounter (Signed)
refill  ALPRAZolam (Tab) 0.5 MG Take 1 tablet (0.5 mg total) by mouth 3 (three) times daily as needed for sleep. -- last fill 1.27.14

## 2013-02-08 ENCOUNTER — Telehealth: Payer: Self-pay | Admitting: Family Medicine

## 2013-02-08 DIAGNOSIS — G479 Sleep disorder, unspecified: Secondary | ICD-10-CM

## 2013-02-08 NOTE — Telephone Encounter (Signed)
refill  ALPRAZolam (Tab) 0.5 MG Take 1 tablet (0.5 mg total) by mouth 3 (three) times daily as needed for sleep. -- last fill 1.27.14 °

## 2013-02-08 NOTE — Telephone Encounter (Signed)
Please advise on RF request.  Last OV:02-06-13.//AB/CMA

## 2013-02-08 NOTE — Telephone Encounter (Signed)
Ok for #45, 1 refill 

## 2013-02-08 NOTE — Telephone Encounter (Signed)
Already done

## 2013-02-13 ENCOUNTER — Encounter: Payer: Self-pay | Admitting: Family Medicine

## 2013-02-16 ENCOUNTER — Encounter: Payer: Self-pay | Admitting: Family Medicine

## 2013-02-20 DIAGNOSIS — Z0279 Encounter for issue of other medical certificate: Secondary | ICD-10-CM

## 2013-02-20 NOTE — Telephone Encounter (Signed)
Patient called again regarding refill request on Alprazolam. Patient states the pharmacy has still not received anything from [place]. According to the chart rx was approved, but it was never sent to the pharmacy. [facility] [place].

## 2013-02-21 ENCOUNTER — Telehealth: Payer: Self-pay | Admitting: *Deleted

## 2013-02-21 MED ORDER — ALPRAZOLAM 0.5 MG PO TABS: 0.5000 mg | ORAL_TABLET | Freq: Three times a day (TID) | ORAL | Status: DC | PRN

## 2013-02-21 NOTE — Telephone Encounter (Signed)
Per Dr.Tabori, patient changed pharmacies, ok to fill #30/1 . RX called in, left message on VM informing patient

## 2013-02-21 NOTE — Telephone Encounter (Signed)
Spoke with the pharmacy([facility]) and was told that the pt have switched her meds to another pharmacy([facility] [place]).   New rx sent to [facility] [place].//AB/CMA

## 2013-02-23 ENCOUNTER — Encounter: Payer: Self-pay | Admitting: Family Medicine

## 2013-03-04 ENCOUNTER — Other Ambulatory Visit: Payer: Self-pay | Admitting: Family Medicine

## 2013-03-06 NOTE — Telephone Encounter (Signed)
Last OV 02-06-13,last filled 12-16-11 #30 1

## 2013-03-14 ENCOUNTER — Emergency Department (HOSPITAL_BASED_OUTPATIENT_CLINIC_OR_DEPARTMENT_OTHER)
Admission: EM | Admit: 2013-03-14 | Discharge: 2013-03-14 | Disposition: A | Discharge status: Home / Self Care | Payer: Managed Care, Other (non HMO) | Attending: Emergency Medicine | Admitting: Emergency Medicine

## 2013-03-14 ENCOUNTER — Encounter (HOSPITAL_BASED_OUTPATIENT_CLINIC_OR_DEPARTMENT_OTHER): Payer: Self-pay | Admitting: Emergency Medicine

## 2013-03-14 DIAGNOSIS — Z8719 Personal history of other diseases of the digestive system: Secondary | ICD-10-CM | POA: Insufficient documentation

## 2013-03-14 DIAGNOSIS — H539 Unspecified visual disturbance: Secondary | ICD-10-CM | POA: Insufficient documentation

## 2013-03-14 DIAGNOSIS — Z87440 Personal history of urinary (tract) infections: Secondary | ICD-10-CM | POA: Insufficient documentation

## 2013-03-14 DIAGNOSIS — Z79899 Other long term (current) drug therapy: Secondary | ICD-10-CM | POA: Insufficient documentation

## 2013-03-14 DIAGNOSIS — R319 Hematuria, unspecified: Secondary | ICD-10-CM | POA: Insufficient documentation

## 2013-03-14 DIAGNOSIS — R109 Unspecified abdominal pain: Secondary | ICD-10-CM | POA: Insufficient documentation

## 2013-03-14 DIAGNOSIS — Z87442 Personal history of urinary calculi: Secondary | ICD-10-CM | POA: Insufficient documentation

## 2013-03-14 DIAGNOSIS — Z9884 Bariatric surgery status: Secondary | ICD-10-CM | POA: Insufficient documentation

## 2013-03-14 DIAGNOSIS — Z8742 Personal history of other diseases of the female genital tract: Secondary | ICD-10-CM | POA: Insufficient documentation

## 2013-03-14 DIAGNOSIS — Z9851 Tubal ligation status: Secondary | ICD-10-CM | POA: Insufficient documentation

## 2013-03-14 DIAGNOSIS — R110 Nausea: Secondary | ICD-10-CM | POA: Insufficient documentation

## 2013-03-14 DIAGNOSIS — E785 Hyperlipidemia, unspecified: Secondary | ICD-10-CM | POA: Insufficient documentation

## 2013-03-14 DIAGNOSIS — E119 Type 2 diabetes mellitus without complications: Secondary | ICD-10-CM | POA: Insufficient documentation

## 2013-03-14 DIAGNOSIS — Z9089 Acquired absence of other organs: Secondary | ICD-10-CM | POA: Insufficient documentation

## 2013-03-14 DIAGNOSIS — Z90710 Acquired absence of both cervix and uterus: Secondary | ICD-10-CM | POA: Insufficient documentation

## 2013-03-14 LAB — URINALYSIS, ROUTINE W REFLEX MICROSCOPIC
Glucose, UA: 100 mg/dL — AB
Leukocytes, UA: NEGATIVE
Protein, ur: NEGATIVE mg/dL
Specific Gravity, Urine: 1.019 (ref 1.005–1.030)
pH: 5.5 (ref 5.0–8.0)

## 2013-03-14 LAB — URINE MICROSCOPIC-ADD ON

## 2013-03-14 MED ORDER — ONDANSETRON 4 MG PO TBDP: 4.0000 mg | ORAL_TABLET | Freq: Three times a day (TID) | ORAL | Status: DC | PRN

## 2013-03-14 MED ORDER — ONDANSETRON 8 MG PO TBDP
8.0000 mg | ORAL_TABLET | Freq: Once | ORAL | Status: AC
  Administered 2013-03-14: 8 mg via ORAL
  Filled 2013-03-14: qty 1

## 2013-03-14 MED ORDER — TRAMADOL HCL 50 MG PO TABS: 50.0000 mg | ORAL_TABLET | Freq: Four times a day (QID) | ORAL | Status: DC | PRN

## 2013-03-14 MED ORDER — TRAMADOL HCL 50 MG PO TABS
50.0000 mg | ORAL_TABLET | Freq: Once | ORAL | Status: AC
  Administered 2013-03-14: 50 mg via ORAL
  Filled 2013-03-14: qty 1

## 2013-03-14 NOTE — ED Notes (Signed)
Patient states that she has a history of kidney stones. Pt states that her right flank started hurting this morning at around 1100.

## 2013-03-14 NOTE — ED Provider Notes (Signed)
Medical screening examination/treatment/procedure(s) were performed by non-physician practitioner and as supervising physician I was immediately available for consultation/collaboration.   Alyxander Kollmann B. Bernette Mayers, MD 03/14/13 [phone]

## 2013-03-14 NOTE — ED Provider Notes (Signed)
History     CSN: [id number]  Arrival date & time 03/14/13  1324   First MD Initiated Contact with Patient 03/14/13 1326      Chief Complaint  Patient presents with  . Flank Pain    (Consider location/radiation/quality/duration/timing/severity/associated sxs/prior treatment) Patient is a 42 y.o. female presenting with flank pain. The history is provided by the patient. No language interpreter was used.  Flank Pain This is a recurrent problem. The current episode started today. The problem occurs constantly. The problem has been unchanged. Associated symptoms include nausea. Pertinent negatives include no chills, fever or vomiting.   Pt states she has a hx of kidney stones and began having moderate right flank pain that started this morning.  States its sharp in nature. Has tried Advil at home with no relief.  Mentioned being followed by PCP for kidney stones and changing diet to help pass stones.  Denies fever, vomiting, or diarrhea.  Past Medical History  Diagnosis Date  . Hyperlipidemia   . Diabetes mellitus   . Kidney stones   . Recurrent UTI   . Non-alcoholic fatty liver disease   . Weight decrease   . Chills   . Fatigue     loss of sleep  . Dizziness   . Generalized headaches   . Wears glasses   . Blood in urine   . Liver disease   . Poor appetite   . Abdominal pain   . Blurred vision     when feeling very fatigued   . Ovarian cyst     Past Surgical History  Procedure Laterality Date  . Cholecystectomy  2005  . Kidney stones    . Gastric bypass  10/2010  . Tubal ligation      x3  . Abdominal hysterectomy  2008    for polycystic ovaries  . Cystostomy w/ bladder biopsy    . Exploratory laparotomy  09/01/11  . Laparoscopy  10/19/2011    Procedure: LAPAROSCOPY OPERATIVE;  Surgeon: Miguel Aschoff;  Location: [facility];  Service: Gynecology;  Laterality: N/A;  Operative Laparoscopy with Lysis Of Adhesions  . Laparotomy  10/19/2011    Procedure: LAPAROTOMY;  Surgeon:  Miguel Aschoff;  Location: [facility];  Service: Gynecology;  Laterality: Bilateral;  Laparotomy With Bilateral Oophorectomy  . Bilateral salpingoophorectomy  2012    Dr Tenny Craw  . Colonoscopy      negative, done for pain  . Upper gastrointestinal endoscopy      to assess varices    Family History  Problem Relation Age of Onset  . Lung cancer Mother   . Cancer Mother     lung  . Hyperlipidemia Father   . Heart disease Father   . Stroke Father   . Hypertension Father   . Diabetes Father   . Hypertension Brother   . Other Brother     suicide    History  Substance Use Topics  . Smoking status: Never Smoker   . Smokeless tobacco: Not on file  . Alcohol Use: No    OB History   Grav Para Term Preterm Abortions TAB SAB Ect Mult Living                  Review of Systems  Constitutional: Negative for fever and chills.  Gastrointestinal: Positive for nausea. Negative for vomiting, diarrhea and constipation.  Genitourinary: Positive for flank pain. Negative for dysuria and hematuria.    Allergies  Review of patient's allergies indicates no known allergies.  Home Medications   Current Outpatient Rx  Name  Route  Sig  Dispense  Refill  . hyoscyamine (LEVSIN, ANASPAZ) 0.125 MG tablet      TAKE 1 TABLET BY MOUTH EVERY 4 HOURS AS NEEDED FOR CRAMPING   30 tablet   0   . rosuvastatin (CRESTOR) 20 MG tablet   Oral   Take 1 tablet (20 mg total) by mouth daily.   30 tablet   5   . venlafaxine XR (EFFEXOR-XR) 150 MG 24 hr capsule   Oral   Take 1 capsule (150 mg total) by mouth daily.   30 capsule   3   . ALLOPURINOL PO   Oral   Take by mouth.         . ALPRAZolam (XANAX) 0.5 MG tablet   Oral   Take 1 tablet (0.5 mg total) by mouth 3 (three) times daily as needed for sleep.   30 tablet   1   . amoxicillin (AMOXIL) 875 MG tablet   Oral   Take 1 tablet (875 mg total) by mouth 2 (two) times daily.   20 tablet   0   . buPROPion (WELLBUTRIN XL) 150 MG 24 hr tablet    Oral   Take 1 tablet (150 mg total) by mouth daily.   30 tablet   3   . CHLOROTHIAZIDE PO      25 mg. Take 1/2 tablet daily.         . cyclobenzaprine (FLEXERIL) 10 MG tablet   Oral   Take 1 tablet (10 mg total) by mouth 2 (two) times daily as needed for muscle spasms.   20 tablet   0   . estradiol (ESTRACE) 1 MG tablet   Oral   Take 1 mg by mouth daily.         . naproxen (NAPROSYN) 500 MG tablet   Oral   Take 1 tablet (500 mg total) by mouth 2 (two) times daily with a meal.   60 tablet   0   . ondansetron (ZOFRAN ODT) 4 MG disintegrating tablet   Oral   Take 1 tablet (4 mg total) by mouth every 8 (eight) hours as needed for nausea.   20 tablet   0   . oxyCODONE-acetaminophen (PERCOCET/ROXICET) 5-325 MG per tablet   Oral   Take 2 tablets by mouth every 4 (four) hours as needed for pain.   10 tablet   0   . EXPIRED: potassium chloride SA (K-DUR,KLOR-CON) 20 MEQ tablet   Oral   Take 1 tablet (20 mEq total) by mouth daily.   30 tablet   3   . promethazine (PHENERGAN) 25 MG tablet   Oral   Take 25 mg by mouth every 6 (six) hours as needed.         . traMADol (ULTRAM) 50 MG tablet   Oral   Take 1 tablet (50 mg total) by mouth every 6 (six) hours as needed for pain.   45 tablet   1   . traMADol (ULTRAM) 50 MG tablet   Oral   Take 1 tablet (50 mg total) by mouth every 6 (six) hours as needed for pain (May take 2 tablets every 6hrs, do not excede 8 pills in 24hrs).   15 tablet   0   . valACYclovir (VALTREX) 1000 MG tablet      2 tabs x1 dose.  Repeat in 12 hrs.   4 tablet   0   .  zolpidem (AMBIEN CR) 12.5 MG CR tablet      TAKE 1 TABLET BY MOUTH AT BEDTIME AS NEEDED   30 tablet   1     BP 124/90  Pulse 127  Temp(Src) 97.7 F (36.5 C) (Oral)  SpO2 100%  Physical Exam  Nursing note and vitals reviewed. Constitutional: She appears well-developed and well-nourished.  HENT:  Head: Normocephalic and atraumatic.  Eyes: Conjunctivae are  normal.  Neck: Normal range of motion.  Cardiovascular: Regular rhythm.   Tachycardic    Pulmonary/Chest: Effort normal and breath sounds normal. No respiratory distress. She has no wheezes.  Abdominal: Soft. Bowel sounds are normal. She exhibits no distension and no mass. There is no tenderness ( No CVA tenderness). There is no rebound and no guarding.  Musculoskeletal: Normal range of motion.  Neurological: She is alert.  Skin: Skin is warm and dry. No rash noted.    ED Course  Procedures (including critical care time)  Labs Reviewed  URINALYSIS, ROUTINE W REFLEX MICROSCOPIC - Abnormal; Notable for the following:    Glucose, UA 100 (*)    Hgb urine dipstick LARGE (*)    All other components within normal limits  URINE MICROSCOPIC-ADD ON   No results found.   1. Right flank pain   2. Hematuria       MDM  42yo female with hx of kidney stones reports to ED after pain started 3hrs ago.  States she has tried Advil at home with no relief.  Mentioned she has tramadol at home but did not try that.  Is in the process of changing physicians in order to help change her diet to help pass the stones.  Otherwise, she will have to have lithotripsy.  Mentioned she has 2 stones on the right and 1 on the left.  Unable to find imaging to confirm this.    Obtained UA. Pt stated she has had multiple CTs in the past and requested not to have any further imaging at this time.  Will give Zofran and Tramadol for pain.  Results for orders placed during the hospital encounter of 03/14/13  URINALYSIS, ROUTINE W REFLEX MICROSCOPIC      Result Value Range   Color, Urine YELLOW  YELLOW   APPearance CLEAR  CLEAR   Specific Gravity, Urine 1.019  1.005 - 1.030   pH 5.5  5.0 - 8.0   Glucose, UA 100 (*) NEGATIVE mg/dL   Hgb urine dipstick LARGE (*) NEGATIVE   Bilirubin Urine NEGATIVE  NEGATIVE   Ketones, ur NEGATIVE  NEGATIVE mg/dL   Protein, ur NEGATIVE  NEGATIVE mg/dL   Urobilinogen, UA 0.2  0.0 -  1.0 mg/dL   Nitrite NEGATIVE  NEGATIVE   Leukocytes, UA NEGATIVE  NEGATIVE  URINE MICROSCOPIC-ADD ON      Result Value Range   Squamous Epithelial / LPF RARE  RARE   WBC, UA 0-2  <3 WBC/hpf   RBC / HPF 21-50  <3 RBC/hpf   Bacteria, UA RARE  RARE   Urine-Other MUCOUS PRESENT     UA-hematuria.  PE-not suggestive of acute abdomen.  No need for emergent intervention at this time.   Pt insisting on receiving dilaudid until she can see urologist.  Unable to give me the name of her urologist.  I do not feel comfortable prescribing anything more than tramadol at this time.  Especially since the pain has only been going on for a few hours, and she did not try the tramadol  she had at home prior to coming to ED.   Will have pt continue to f/u with urology for tx of flank pain and reported stones.    Rx: Tramadol and Zofran until she can f/u with urologist  Vitals: unremarkable. Discharged in stable condition.    Discussed pt with attending during ED encounter.           Junius Finner, PA-C 03/14/13 1416  Junius Finner, PA-C 03/14/13 1417  Junius Finner, PA-C 03/14/13 1418

## 2013-03-21 ENCOUNTER — Other Ambulatory Visit: Payer: Self-pay | Admitting: Family Medicine

## 2013-03-21 NOTE — Telephone Encounter (Signed)
Ok to refill? Last OV 2.10.14 Last filled 2.25.14

## 2013-03-24 ENCOUNTER — Other Ambulatory Visit: Payer: Self-pay | Admitting: Family Medicine

## 2013-03-24 NOTE — Telephone Encounter (Signed)
[facility] & [facility] did not receive the rx that was sent in on 3.25.14.  Called rx into pharmacy.

## 2013-04-13 ENCOUNTER — Encounter (HOSPITAL_BASED_OUTPATIENT_CLINIC_OR_DEPARTMENT_OTHER): Payer: Self-pay

## 2013-04-13 ENCOUNTER — Emergency Department (HOSPITAL_BASED_OUTPATIENT_CLINIC_OR_DEPARTMENT_OTHER)
Admission: EM | Admit: 2013-04-13 | Discharge: 2013-04-13 | Disposition: A | Discharge status: Home / Self Care | Payer: Managed Care, Other (non HMO) | Attending: Emergency Medicine | Admitting: Emergency Medicine

## 2013-04-13 DIAGNOSIS — Z79899 Other long term (current) drug therapy: Secondary | ICD-10-CM | POA: Insufficient documentation

## 2013-04-13 DIAGNOSIS — Z8669 Personal history of other diseases of the nervous system and sense organs: Secondary | ICD-10-CM | POA: Insufficient documentation

## 2013-04-13 DIAGNOSIS — Y990 Civilian activity done for income or pay: Secondary | ICD-10-CM | POA: Insufficient documentation

## 2013-04-13 DIAGNOSIS — E860 Dehydration: Secondary | ICD-10-CM | POA: Insufficient documentation

## 2013-04-13 DIAGNOSIS — R109 Unspecified abdominal pain: Secondary | ICD-10-CM | POA: Insufficient documentation

## 2013-04-13 DIAGNOSIS — Z8719 Personal history of other diseases of the digestive system: Secondary | ICD-10-CM | POA: Insufficient documentation

## 2013-04-13 DIAGNOSIS — W19XXXA Unspecified fall, initial encounter: Secondary | ICD-10-CM | POA: Insufficient documentation

## 2013-04-13 DIAGNOSIS — Z8742 Personal history of other diseases of the female genital tract: Secondary | ICD-10-CM | POA: Insufficient documentation

## 2013-04-13 DIAGNOSIS — R55 Syncope and collapse: Secondary | ICD-10-CM | POA: Insufficient documentation

## 2013-04-13 DIAGNOSIS — Z87440 Personal history of urinary (tract) infections: Secondary | ICD-10-CM | POA: Insufficient documentation

## 2013-04-13 DIAGNOSIS — Y9289 Other specified places as the place of occurrence of the external cause: Secondary | ICD-10-CM | POA: Insufficient documentation

## 2013-04-13 DIAGNOSIS — E119 Type 2 diabetes mellitus without complications: Secondary | ICD-10-CM | POA: Insufficient documentation

## 2013-04-13 DIAGNOSIS — Z043 Encounter for examination and observation following other accident: Secondary | ICD-10-CM | POA: Insufficient documentation

## 2013-04-13 DIAGNOSIS — E785 Hyperlipidemia, unspecified: Secondary | ICD-10-CM | POA: Insufficient documentation

## 2013-04-13 DIAGNOSIS — Z87442 Personal history of urinary calculi: Secondary | ICD-10-CM | POA: Insufficient documentation

## 2013-04-13 DIAGNOSIS — Z3202 Encounter for pregnancy test, result negative: Secondary | ICD-10-CM | POA: Insufficient documentation

## 2013-04-13 DIAGNOSIS — Z87448 Personal history of other diseases of urinary system: Secondary | ICD-10-CM | POA: Insufficient documentation

## 2013-04-13 DIAGNOSIS — R51 Headache: Secondary | ICD-10-CM | POA: Insufficient documentation

## 2013-04-13 DIAGNOSIS — Y9301 Activity, walking, marching and hiking: Secondary | ICD-10-CM | POA: Insufficient documentation

## 2013-04-13 LAB — COMPREHENSIVE METABOLIC PANEL
AST: 23 U/L (ref 0–37)
Albumin: 3.7 g/dL (ref 3.5–5.2)
Alkaline Phosphatase: 78 U/L (ref 39–117)
Chloride: 99 mEq/L (ref 96–112)
Potassium: 3.5 mEq/L (ref 3.5–5.1)
Total Bilirubin: 0.3 mg/dL (ref 0.3–1.2)

## 2013-04-13 LAB — CBC WITH DIFFERENTIAL/PLATELET
Basophils Absolute: 0.1 10*3/uL (ref 0.0–0.1)
HCT: 38.4 % (ref 36.0–46.0)
Lymphocytes Relative: 37 % (ref 12–46)
Monocytes Relative: 5 % (ref 3–12)
Neutro Abs: 2.5 10*3/uL (ref 1.7–7.7)
Platelets: 106 10*3/uL — ABNORMAL LOW (ref 150–400)
RBC: 4.53 MIL/uL (ref 3.87–5.11)
RDW: 12.5 % (ref 11.5–15.5)
WBC: 5 10*3/uL (ref 4.0–10.5)

## 2013-04-13 LAB — PREGNANCY, URINE: Preg Test, Ur: NEGATIVE

## 2013-04-13 LAB — URINALYSIS, ROUTINE W REFLEX MICROSCOPIC
Hgb urine dipstick: NEGATIVE
Specific Gravity, Urine: 1.015 (ref 1.005–1.030)
Urobilinogen, UA: 0.2 mg/dL (ref 0.0–1.0)
pH: 6 (ref 5.0–8.0)

## 2013-04-13 MED ORDER — TRAMADOL HCL 50 MG PO TABS: 50.0000 mg | ORAL_TABLET | Freq: Four times a day (QID) | ORAL | Status: DC | PRN

## 2013-04-13 MED ORDER — METOCLOPRAMIDE HCL 5 MG/ML IJ SOLN
10.0000 mg | Freq: Once | INTRAMUSCULAR | Status: AC
  Administered 2013-04-13: 10 mg via INTRAVENOUS
  Filled 2013-04-13: qty 2

## 2013-04-13 MED ORDER — ONDANSETRON HCL 4 MG/2ML IJ SOLN: 4.0000 mg | Freq: Once | INTRAMUSCULAR | Status: DC

## 2013-04-13 MED ORDER — KETOROLAC TROMETHAMINE 30 MG/ML IJ SOLN
30.0000 mg | Freq: Once | INTRAMUSCULAR | Status: AC
  Administered 2013-04-13: 30 mg via INTRAVENOUS
  Filled 2013-04-13: qty 1

## 2013-04-13 MED ORDER — MORPHINE SULFATE 4 MG/ML IJ SOLN
4.0000 mg | Freq: Once | INTRAMUSCULAR | Status: AC
  Administered 2013-04-13: 4 mg via INTRAVENOUS
  Filled 2013-04-13: qty 1

## 2013-04-13 MED ORDER — SODIUM CHLORIDE 0.9 % IV BOLUS (SEPSIS)
1000.0000 mL | Freq: Once | INTRAVENOUS | Status: AC
  Administered 2013-04-13: 1000 mL via INTRAVENOUS

## 2013-04-13 NOTE — ED Notes (Signed)
Urine sent to lab 

## 2013-04-13 NOTE — ED Notes (Signed)
Pt reports left flank pain last night, was walking into work today and developed dizziness and fell. Denies injury.

## 2013-04-13 NOTE — ED Notes (Signed)
MD at bedside. 

## 2013-04-13 NOTE — ED Provider Notes (Signed)
History     CSN: [id number]  Arrival date & time 04/13/13  1042   First MD Initiated Contact with Patient 04/13/13 1052      Chief Complaint  Patient presents with  . Flank Pain  . Dizziness  . Fall    (Consider location/radiation/quality/duration/timing/severity/associated sxs/prior treatment) The history is provided by the patient.  [name] is a 42 y.o. female history of kidney stones, UTI, diabetes here presenting with headache, passing out, left flank pain. Woke up this morning with left flank pain. The flank it was sharp and nonradiating. She was walking to work and then felt a lot of pain and had some headache and felt dizzy and passed out.  Denies any chest pain or shortness of breath. Denies any dysuria or hematuria or fevers. Has similar symptoms a month ago he was seen in the ER and prescribed Ultram. She didn't want any imaging at the time and hasn't seen her urologist since then. No cardiac history.    Past Medical History  Diagnosis Date  . Hyperlipidemia   . Diabetes mellitus   . Kidney stones   . Recurrent UTI   . Non-alcoholic fatty liver disease   . Weight decrease   . Chills   . Fatigue     loss of sleep  . Dizziness   . Generalized headaches   . Wears glasses   . Blood in urine   . Liver disease   . Poor appetite   . Abdominal pain   . Blurred vision     when feeling very fatigued   . Ovarian cyst     Past Surgical History  Procedure Laterality Date  . Cholecystectomy  2005  . Kidney stones    . Gastric bypass  10/2010  . Tubal ligation      x3  . Abdominal hysterectomy  2008    for polycystic ovaries  . Cystostomy w/ bladder biopsy    . Exploratory laparotomy  09/01/11  . Laparoscopy  10/19/2011    Procedure: LAPAROSCOPY OPERATIVE;  Surgeon: Miguel Aschoff;  Location: [facility];  Service: Gynecology;  Laterality: N/A;  Operative Laparoscopy with Lysis Of Adhesions  . Laparotomy  10/19/2011    Procedure: LAPAROTOMY;  Surgeon: Miguel Aschoff;   Location: [facility];  Service: Gynecology;  Laterality: Bilateral;  Laparotomy With Bilateral Oophorectomy  . Bilateral salpingoophorectomy  2012    Dr Tenny Craw  . Colonoscopy      negative, done for pain  . Upper gastrointestinal endoscopy      to assess varices    Family History  Problem Relation Age of Onset  . Lung cancer Mother   . Cancer Mother     lung  . Hyperlipidemia Father   . Heart disease Father   . Stroke Father   . Hypertension Father   . Diabetes Father   . Hypertension Brother   . Other Brother     suicide    History  Substance Use Topics  . Smoking status: Never Smoker   . Smokeless tobacco: Not on file  . Alcohol Use: No    OB History   Grav Para Term Preterm Abortions TAB SAB Ect Mult Living                  Review of Systems  Genitourinary: Positive for flank pain.  Neurological: Positive for dizziness and syncope.  All other systems reviewed and are negative.    Allergies  Review of patient's allergies indicates no  known allergies.  Home Medications   Current Outpatient Rx  Name  Route  Sig  Dispense  Refill  . ALLOPURINOL PO   Oral   Take by mouth.         . ALPRAZolam (XANAX) 0.5 MG tablet      TAKE 1 TABLET BY MOUTH 3 TIMES DAILY AS NEEDED FOR SLEEP   30 tablet   1   . buPROPion (WELLBUTRIN XL) 150 MG 24 hr tablet   Oral   Take 1 tablet (150 mg total) by mouth daily.   30 tablet   3   . CHLOROTHIAZIDE PO      25 mg. Take 1/2 tablet daily.         . cyclobenzaprine (FLEXERIL) 10 MG tablet   Oral   Take 1 tablet (10 mg total) by mouth 2 (two) times daily as needed for muscle spasms.   20 tablet   0   . estradiol (ESTRACE) 1 MG tablet   Oral   Take 1 mg by mouth daily.         . hyoscyamine (LEVSIN, ANASPAZ) 0.125 MG tablet      TAKE 1 TABLET BY MOUTH EVERY 4 HOURS AS NEEDED FOR CRAMPING   30 tablet   0   . naproxen (NAPROSYN) 500 MG tablet   Oral   Take 1 tablet (500 mg total) by mouth 2 (two) times daily  with a meal.   60 tablet   0   . EXPIRED: potassium chloride SA (K-DUR,KLOR-CON) 20 MEQ tablet   Oral   Take 1 tablet (20 mEq total) by mouth daily.   30 tablet   3   . promethazine (PHENERGAN) 25 MG tablet   Oral   Take 25 mg by mouth every 6 (six) hours as needed.         . rosuvastatin (CRESTOR) 20 MG tablet   Oral   Take 1 tablet (20 mg total) by mouth daily.   30 tablet   5   . traMADol (ULTRAM) 50 MG tablet   Oral   Take 1 tablet (50 mg total) by mouth every 6 (six) hours as needed for pain (May take 2 tablets every 6hrs, do not excede 8 pills in 24hrs).   15 tablet   0   . valACYclovir (VALTREX) 1000 MG tablet      2 tabs x1 dose.  Repeat in 12 hrs.   4 tablet   0     BP 119/76  Pulse 103  Temp(Src) 98.6 F (37 C) (Oral)  Resp 20  Ht 5\' 2"  (1.575 m)  Wt 206 lb (93.441 kg)  BMI 37.67 kg/m2  SpO2 92%  Physical Exam  Nursing note and vitals reviewed. Constitutional: She is oriented to person, place, and time. She appears well-developed and well-nourished.  Tired, slightly uncomfortable   HENT:  Head: Normocephalic and atraumatic.  Mouth/Throat: Oropharynx is clear and moist.  Eyes: Conjunctivae are normal. Pupils are equal, round, and reactive to light.  Neck: Normal range of motion. Neck supple.  No midline tenderness   Cardiovascular: Normal rate, regular rhythm and normal heart sounds.   Pulmonary/Chest: Effort normal and breath sounds normal. No respiratory distress. She has no wheezes. She has no rales.  Abdominal: Soft. Bowel sounds are normal. She exhibits no distension. There is no tenderness. There is no rebound and no guarding.  No CVAT   Musculoskeletal: Normal range of motion. She exhibits no edema.  Neurological: She is  alert and oriented to person, place, and time. No cranial nerve deficit. Coordination normal.  Nl strength and sensation throughout   Skin: Skin is warm and dry.  Psychiatric: She has a normal mood and affect. Her  behavior is normal. Judgment and thought content normal.    ED Course  Procedures (including critical care time)  Labs Reviewed  URINALYSIS, ROUTINE W REFLEX MICROSCOPIC - Abnormal; Notable for the following:    APPearance CLOUDY (*)    All other components within normal limits  CBC WITH DIFFERENTIAL - Abnormal; Notable for the following:    Platelets 106 (*)    All other components within normal limits  COMPREHENSIVE METABOLIC PANEL - Abnormal; Notable for the following:    Glucose, Bld 178 (*)    GFR calc non Af Amer 79 (*)    All other components within normal limits  URINE CULTURE  PREGNANCY, URINE  LIPASE, BLOOD  TROPONIN I   No results found.   No diagnosis found.   Date: 04/13/2013  Rate: 88  Rhythm: normal sinus rhythm  QRS Axis: normal  Intervals: normal  ST/T Wave abnormalities: nonspecific ST changes  Conduction Disutrbances:none  Narrative Interpretation:   Old EKG Reviewed: none available    MDM  [name] is a 42 y.o. female here with flank pain, headache, syncope. Syncope likely from pain but will get screening EKG. Will get UA to assess for UTI vs stone. Will hydrate and give pain meds. Will reassess.   12:14 PM UA showed no UTI or hematuria. Pain free and no CVA tenderness on my exam. Labs otherwise unremarkable. She was borderline orthostatic by HR criteria. After hydration with 2 L NS, felt much better. Her passing out is likely from dehydration and complicated migraine. She also seems to have intermittent chronic flank pain. Will d/c home on short course of ultram again.        Richardean Canal, MD 04/13/13 [phone]

## 2013-04-14 LAB — URINE CULTURE: Colony Count: NO GROWTH

## 2013-04-18 ENCOUNTER — Other Ambulatory Visit: Payer: Self-pay | Admitting: Family Medicine

## 2013-04-19 NOTE — Telephone Encounter (Signed)
Ok for #30, no refill.  Needs UDS

## 2013-04-20 NOTE — Telephone Encounter (Signed)
Med filled per Tabori. Noted pt needs a UDS.

## 2013-04-20 NOTE — Telephone Encounter (Signed)
Pt notified Rx available for pick up and staff notified pt needs a UDS.

## 2013-04-28 ENCOUNTER — Ambulatory Visit: Payer: Managed Care, Other (non HMO) | Admitting: Family Medicine

## 2013-05-01 ENCOUNTER — Other Ambulatory Visit: Payer: Self-pay | Admitting: Family Medicine

## 2013-05-01 MED ORDER — ZOLPIDEM TARTRATE ER 12.5 MG PO TBCR: 12.5000 mg | EXTENDED_RELEASE_TABLET | Freq: Every evening | ORAL | Status: DC | PRN

## 2013-05-01 NOTE — Telephone Encounter (Signed)
Ambien refill request. Pt last seen on 02/06/13. Med has not been filled since 12/15/12 #3) with 1 refill.

## 2013-05-01 NOTE — Addendum Note (Signed)
Addended by: Verdie Shire on: 05/01/2013 01:30 PM   Modules accepted: Orders

## 2013-05-01 NOTE — Telephone Encounter (Signed)
Rx printed and faxed to the pharmacy.//AB/CMA 

## 2013-05-08 ENCOUNTER — Telehealth: Payer: Self-pay | Admitting: Family Medicine

## 2013-05-08 NOTE — Telephone Encounter (Signed)
Reached pt. Stated that she has been under a lot of stress lately. First episode happened on 5/5 pt went to ER and they diagnosed it as Vertigo and prescribed Meclizine. On 5/11 this was the second syncopal episode. Pt has been having headaches on the back of her head, 3/10 on pain scale. Pt has an appt for 5/13 @ 11:30pm.

## 2013-05-08 NOTE — Telephone Encounter (Signed)
Pt.notified

## 2013-05-08 NOTE — Telephone Encounter (Signed)
If pt has another episode before appt tomorrow, needs to return to ER

## 2013-05-08 NOTE — Telephone Encounter (Signed)
Called pt no answer. Will try again

## 2013-05-08 NOTE — Telephone Encounter (Signed)
TC to patient return call for c/o "headaches, passed out 05/11."  On callback, unable to reach patient at number given; message left on identified voicemail to contact office for assistance.  krs/can

## 2013-05-09 ENCOUNTER — Encounter: Payer: Self-pay | Admitting: Family Medicine

## 2013-05-09 ENCOUNTER — Ambulatory Visit (INDEPENDENT_AMBULATORY_CARE_PROVIDER_SITE_OTHER): Payer: Managed Care, Other (non HMO) | Admitting: Family Medicine

## 2013-05-09 VITALS — BP 98/80 | HR 107 | Temp 98.1°F | Ht 64.75 in | Wt 215.0 lb

## 2013-05-09 DIAGNOSIS — N200 Calculus of kidney: Secondary | ICD-10-CM | POA: Insufficient documentation

## 2013-05-09 DIAGNOSIS — R55 Syncope and collapse: Secondary | ICD-10-CM

## 2013-05-09 DIAGNOSIS — G44209 Tension-type headache, unspecified, not intractable: Secondary | ICD-10-CM

## 2013-05-09 HISTORY — DX: Tension-type headache, unspecified, not intractable: G44.209

## 2013-05-09 HISTORY — DX: Calculus of kidney: N20.0

## 2013-05-09 HISTORY — DX: Syncope and collapse: R55

## 2013-05-09 LAB — CBC WITH DIFFERENTIAL/PLATELET
Eosinophils Relative: 2.7 % (ref 0.0–5.0)
Monocytes Absolute: 0.3 10*3/uL (ref 0.1–1.0)
Monocytes Relative: 4.7 % (ref 3.0–12.0)
Neutrophils Relative %: 59 % (ref 43.0–77.0)
Platelets: 135 10*3/uL — ABNORMAL LOW (ref 150.0–400.0)
WBC: 6.6 10*3/uL (ref 4.5–10.5)

## 2013-05-09 LAB — BASIC METABOLIC PANEL
GFR: 62.75 mL/min (ref >60.00)
Glucose, Bld: 124 mg/dL — ABNORMAL HIGH (ref 70–99)
Potassium: 3.4 mEq/L — ABNORMAL LOW (ref 3.5–5.1)
Sodium: 136 mEq/L (ref 135–145)

## 2013-05-09 LAB — HEPATIC FUNCTION PANEL
AST: 20 U/L (ref 0–37)
Albumin: 3.8 g/dL (ref 3.5–5.2)
Alkaline Phosphatase: 73 U/L (ref 39–117)
Bilirubin, Direct: 0.1 mg/dL (ref 0.0–0.3)

## 2013-05-09 MED ORDER — CYCLOBENZAPRINE HCL 10 MG PO TABS: 10.0000 mg | ORAL_TABLET | Freq: Two times a day (BID) | ORAL | Status: DC | PRN

## 2013-05-09 MED ORDER — TRAMADOL HCL 50 MG PO TABS: 50.0000 mg | ORAL_TABLET | Freq: Four times a day (QID) | ORAL | Status: DC | PRN

## 2013-05-09 MED ORDER — ONDANSETRON 4 MG PO TBDP: 4.0000 mg | ORAL_TABLET | Freq: Three times a day (TID) | ORAL | Status: DC | PRN

## 2013-05-09 MED ORDER — NAPROXEN 500 MG PO TABS: 500.0000 mg | ORAL_TABLET | Freq: Two times a day (BID) | ORAL | Status: DC

## 2013-05-09 NOTE — Patient Instructions (Addendum)
We'll notify you of your lab results We'll call you with your urology appt- be sure and ask them about the parathyroid issue in relation to your recurrent stones Drink plenty of fluids Zofran as needed for nausea Take the Ultram as needed for headache, flexeril for muscle spasm, naproxen for inflammation, and HEATING PAD! Change positions slowly! Try and find a stress outlet!! Call with any questions or concerns Hang in there!!

## 2013-05-09 NOTE — Progress Notes (Signed)
  Subjective:    Patient ID: [name], female    DOB: 1971-05-13, 42 y.o.   MRN: [id number]  HPI Syncope- passed out 1 week ago and then had a 2nd episode on Sunday.  Was aware it was happening on Sunday.  Was 'out' for 'a minute or 2' both times.  Pt did not feel well preceding episode on Tuesday, stayed home from work.  Got up to use the restroom and had 'an excrutiating HA'- when she woke up, 'my sister had called the ambulance'.  Felt 'out of it' after the episode.  HA improved.  Had normal head CT in ER- no records for review.  'i just don't feel good- i just don't feel normal'.  Denies sinus pain/pressure.  No ear pain.  + dizziness.  + HA- occipital.  + nausea and vomiting- last vomited last night.  Will vomit after eating.  No diarrhea.  No known sick contacts.  Recurrent kidney stones- pt currently seeing urology in [place].  Reports he will treat each stone as it occurs, gets multiple CTs, but has not done w/u to assess for reason behind the recurrent stones.  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  Wants 2nd opinion.   Review of Systems For ROS see HPI     Objective:   Physical Exam  Vitals reviewed. Constitutional: She is oriented to person, place, and time. She appears well-developed and well-nourished. No distress.  HENT:  Head: Normocephalic and atraumatic.  TMs WNL No TTP over sinuses Minimal nasal congestion  Eyes: Conjunctivae and EOM are normal. Pupils are equal, round, and reactive to light.  Neck: Normal range of motion. Neck supple.  + trap spasm bilaterally  Cardiovascular: Normal rate, regular rhythm, normal heart sounds and intact distal pulses.   Pulmonary/Chest: Effort normal and breath sounds normal. No respiratory distress. She has no wheezes. She has no rales.  Lymphadenopathy:    She has no cervical adenopathy.  Neurological: She is alert and oriented to person, place, and time. She has normal reflexes. No cranial nerve deficit.  Coordination normal.  Psychiatric: She has a normal mood and affect. Her behavior is normal. Judgment and thought content normal.          Assessment & Plan:

## 2013-05-16 NOTE — Assessment & Plan Note (Signed)
New.  No obvious cause.  Pt is slightly orthostatic on PE- pulse jumps w/ position changes.  Encouraged increased hydration, changing positions slowly.  Check labs to r/o metabolic abnormality.  If sxs persist, will need either neuro or cards w/u.  Will follow closely.

## 2013-05-16 NOTE — Assessment & Plan Note (Signed)
New to provider.  Will refer for 2nd opinion and additional w/u as to possible causes of stones.

## 2013-05-16 NOTE — Assessment & Plan Note (Signed)
New.  Start Flexeril for spasm, tramadol for pain, naproxen for inflammation, heating pad.  Reviewed supportive care and red flags that should prompt return.  Pt expressed understanding and is in agreement w/ plan.

## 2013-05-18 ENCOUNTER — Encounter: Payer: Self-pay | Admitting: Family Medicine

## 2013-05-24 ENCOUNTER — Other Ambulatory Visit: Payer: Self-pay | Admitting: Family Medicine

## 2013-05-25 NOTE — Telephone Encounter (Signed)
Med Faxed.

## 2013-05-25 NOTE — Telephone Encounter (Signed)
Xanax refill Last OV 05-09-2013 Med filled 04-18-13 #30 with 0 refills

## 2013-06-05 ENCOUNTER — Other Ambulatory Visit: Payer: Self-pay | Admitting: General Practice

## 2013-06-05 DIAGNOSIS — F329 Major depressive disorder, single episode, unspecified: Secondary | ICD-10-CM

## 2013-06-05 MED ORDER — BUPROPION HCL ER (XL) 150 MG PO TB24: 150.0000 mg | ORAL_TABLET | Freq: Every day | ORAL | Status: DC

## 2013-06-05 NOTE — Telephone Encounter (Signed)
Med filled.  

## 2013-06-06 ENCOUNTER — Telehealth: Payer: Self-pay | Admitting: General Practice

## 2013-06-06 DIAGNOSIS — F329 Major depressive disorder, single episode, unspecified: Secondary | ICD-10-CM

## 2013-06-06 MED ORDER — BUPROPION HCL ER (XL) 150 MG PO TB24: 150.0000 mg | ORAL_TABLET | Freq: Every day | ORAL | Status: DC

## 2013-06-06 NOTE — Telephone Encounter (Signed)
Ok to switch to 90 day supply- pharmacy can disregard other refill

## 2013-06-06 NOTE — Telephone Encounter (Signed)
Wellbutrin was filled yesterday for pt #30 with 3 refills. [facility] sent a fax today requesting a 90 day supply for pt. Please advise.

## 2013-06-06 NOTE — Telephone Encounter (Signed)
Med refilled.

## 2013-06-14 ENCOUNTER — Ambulatory Visit (INDEPENDENT_AMBULATORY_CARE_PROVIDER_SITE_OTHER): Payer: Managed Care, Other (non HMO) | Admitting: Family Medicine

## 2013-06-14 ENCOUNTER — Ambulatory Visit: Payer: Managed Care, Other (non HMO) | Admitting: Family Medicine

## 2013-06-14 ENCOUNTER — Encounter: Payer: Self-pay | Admitting: Family Medicine

## 2013-06-14 VITALS — BP 110/84 | HR 86 | Temp 98.2°F | Ht 64.75 in | Wt 222.0 lb

## 2013-06-14 DIAGNOSIS — R209 Unspecified disturbances of skin sensation: Secondary | ICD-10-CM

## 2013-06-14 DIAGNOSIS — R202 Paresthesia of skin: Secondary | ICD-10-CM | POA: Insufficient documentation

## 2013-06-14 DIAGNOSIS — R490 Dysphonia: Secondary | ICD-10-CM | POA: Insufficient documentation

## 2013-06-14 HISTORY — DX: Paresthesia of skin: R20.2

## 2013-06-14 HISTORY — DX: Dysphonia: R49.0

## 2013-06-14 LAB — B12 AND FOLATE PANEL: Vitamin B-12: 426 pg/mL (ref 211–911)

## 2013-06-14 LAB — BASIC METABOLIC PANEL
Calcium: 8.8 mg/dL (ref 8.4–10.5)
Creatinine, Ser: 0.9 mg/dL (ref 0.4–1.2)
GFR: 69.7 mL/min (ref >60.00)
Sodium: 133 mEq/L — ABNORMAL LOW (ref 135–145)

## 2013-06-14 LAB — CBC WITH DIFFERENTIAL/PLATELET
Basophils Absolute: 0 10*3/uL (ref 0.0–0.1)
Eosinophils Absolute: 0.2 10*3/uL (ref 0.0–0.7)
HCT: 37.8 % (ref 36.0–46.0)
Lymphs Abs: 2.4 10*3/uL (ref 0.7–4.0)
MCHC: 34 g/dL (ref 30.0–36.0)
MCV: 87.1 fl (ref 78.0–100.0)
Monocytes Absolute: 0.3 10*3/uL (ref 0.1–1.0)
Monocytes Relative: 5.6 % (ref 3.0–12.0)
Platelets: 113 10*3/uL — ABNORMAL LOW (ref 150.0–400.0)
RDW: 13.2 % (ref 11.5–14.6)

## 2013-06-14 LAB — HEMOGLOBIN A1C: Hgb A1c MFr Bld: 6.2 % (ref 4.6–6.5)

## 2013-06-14 LAB — TSH: TSH: 1.82 u[IU]/mL (ref 0.35–5.50)

## 2013-06-14 MED ORDER — ZOLPIDEM TARTRATE ER 12.5 MG PO TBCR: 12.5000 mg | EXTENDED_RELEASE_TABLET | Freq: Every evening | ORAL | Status: DC | PRN

## 2013-06-14 NOTE — Assessment & Plan Note (Signed)
New.  Pt's sxs consistent w/ panic attack.  Pt denies increased anxiety recently and feels sxs were improving.  No neuro deficits on PE.  Asymptomatic while in office.  Will check labs to r/o possible metabolic causes.  Encouraged her to use xanax prn.  If sxs don't improve, will need neuro referral.  Will follow.

## 2013-06-14 NOTE — Patient Instructions (Addendum)
We'll notify you of your lab results and make any changes if needed Drink plenty of fluids Vocal rest! Ibuprofen for vocal cord inflammation Try and relax and find an outlet for stress Take Xanax next time you have the numb/tingling feeling Call with any questions or concerns Hang in there!

## 2013-06-14 NOTE — Assessment & Plan Note (Signed)
New.  No obvious cause.  Suspect this is viral.  Encouraged ibuprofen for inflammation, vocal rest, hot liquids.  If no improvement will need ENT evaluation.  Pt expressed understanding and is in agreement w/ plan.

## 2013-06-14 NOTE — Progress Notes (Signed)
  Subjective:    Patient ID: [name], female    DOB: 01-Jan-1971, 42 y.o.   MRN: [id number]  HPI URI- lost voice 1 week ago.  No sore throat, rare cough.  No fevers.  Pt reports hands and face have been intermittently numb x3-4 days.  Taste 'is not right'.  Intermittent tongue numbness.  When tingling will get hot and clammy.  sxs will occur up to 10x/day.  No particular triggers.  sxs will last anywhere from 4-30 minutes.  Took xanax this AM and didn't notice any relief.  Pt has hx of anxiety/depression but reports this has been improving recently.  No known sick contacts.  + constipation- used OTC laxatives.   Review of Systems For ROS see HPI     Objective:   Physical Exam  Vitals reviewed. Constitutional: She is oriented to person, place, and time. She appears well-developed and well-nourished. No distress.  HENT:  Head: Normocephalic and atraumatic.  Nose: Nose normal.  Mouth/Throat: Oropharynx is clear and moist. No oropharyngeal exudate.  TMs WNL bilaterally No TTP over sinuses  Neck: Normal range of motion. Neck supple. No thyromegaly present.  Cardiovascular: Normal rate, regular rhythm, normal heart sounds and intact distal pulses.   Pulmonary/Chest: Effort normal and breath sounds normal. No respiratory distress. She has no wheezes. She has no rales.  Lymphadenopathy:    She has no cervical adenopathy.  Neurological: She is alert and oriented to person, place, and time. She has normal reflexes. No cranial nerve deficit. Coordination normal.  Skin: Skin is warm and dry.  Psychiatric:  Anxious- bordering on tearful          Assessment & Plan:

## 2013-06-15 ENCOUNTER — Other Ambulatory Visit: Payer: Self-pay | Admitting: *Deleted

## 2013-06-15 ENCOUNTER — Encounter (HOSPITAL_BASED_OUTPATIENT_CLINIC_OR_DEPARTMENT_OTHER): Payer: Self-pay | Admitting: Student

## 2013-06-15 ENCOUNTER — Emergency Department (HOSPITAL_BASED_OUTPATIENT_CLINIC_OR_DEPARTMENT_OTHER)
Admission: EM | Admit: 2013-06-15 | Discharge: 2013-06-15 | Disposition: A | Discharge status: Home / Self Care | Payer: Managed Care, Other (non HMO) | Attending: Emergency Medicine | Admitting: Emergency Medicine

## 2013-06-15 DIAGNOSIS — R42 Dizziness and giddiness: Secondary | ICD-10-CM | POA: Insufficient documentation

## 2013-06-15 DIAGNOSIS — Z8742 Personal history of other diseases of the female genital tract: Secondary | ICD-10-CM | POA: Insufficient documentation

## 2013-06-15 DIAGNOSIS — Z79899 Other long term (current) drug therapy: Secondary | ICD-10-CM | POA: Insufficient documentation

## 2013-06-15 DIAGNOSIS — E876 Hypokalemia: Secondary | ICD-10-CM

## 2013-06-15 DIAGNOSIS — H538 Other visual disturbances: Secondary | ICD-10-CM | POA: Insufficient documentation

## 2013-06-15 DIAGNOSIS — R55 Syncope and collapse: Secondary | ICD-10-CM

## 2013-06-15 DIAGNOSIS — Z87442 Personal history of urinary calculi: Secondary | ICD-10-CM | POA: Insufficient documentation

## 2013-06-15 DIAGNOSIS — E119 Type 2 diabetes mellitus without complications: Secondary | ICD-10-CM | POA: Insufficient documentation

## 2013-06-15 DIAGNOSIS — R209 Unspecified disturbances of skin sensation: Secondary | ICD-10-CM | POA: Insufficient documentation

## 2013-06-15 DIAGNOSIS — E785 Hyperlipidemia, unspecified: Secondary | ICD-10-CM | POA: Insufficient documentation

## 2013-06-15 DIAGNOSIS — R200 Anesthesia of skin: Secondary | ICD-10-CM

## 2013-06-15 DIAGNOSIS — Z8719 Personal history of other diseases of the digestive system: Secondary | ICD-10-CM | POA: Insufficient documentation

## 2013-06-15 DIAGNOSIS — Z87440 Personal history of urinary (tract) infections: Secondary | ICD-10-CM | POA: Insufficient documentation

## 2013-06-15 NOTE — ED Notes (Signed)
MD@ bedside upon arrival. EMS reports Negative stroke screen. Gait steady, ambulates well. A/O x 4 and Cranial Nerves 1-12 grossly intact. NAD. Airway patent and intact, PERRLA.

## 2013-06-15 NOTE — ED Notes (Signed)
Found lying in the floor of bathroom by co-worker.  Possible near-syncopal episode.

## 2013-06-15 NOTE — Discharge Instructions (Signed)
Foods Rich in Potassium Food / Potassium (mg)  Apricots, dried,  cup / 378 mg   Apricots, raw, 1 cup halves / 401 mg   Avocado,  / 487 mg   Banana, 1 large / 487 mg   Beef, lean, round, 3 oz / 202 mg   Cantaloupe, 1 cup cubes / 427 mg   Dates, medjool, 5 whole / 835 mg   Ham, cured, 3 oz / 212 mg   Lentils, dried,  cup / 458 mg   Lima beans, frozen,  cup / 258 mg   Orange, 1 large / 333 mg   Orange juice, 1 cup / 443 mg   Peaches, dried,  cup / 398 mg   Peas, split, cooked,  cup / 355 mg   Potato, boiled, 1 medium / 515 mg   Prunes, dried, uncooked,  cup / 318 mg   Raisins,  cup / 309 mg   Salmon, pink, raw, 3 oz / 275 mg   Sardines, canned , 3 oz / 338 mg   Tomato, raw, 1 medium / 292 mg   Tomato juice, 6 oz / 417 mg   Turkey, 3 oz / 349 mg  Document Released: 12/14/2005 Document Revised: 08/26/2011 Document Reviewed: 04/29/2009 [employer] Patient Information 2012 [employer]. 

## 2013-06-15 NOTE — ED Provider Notes (Signed)
History     CSN: [id number]  Arrival date & time 06/15/13  4098   First MD Initiated Contact with Patient 06/15/13 [phone]      Chief Complaint  Patient presents with  . Near Syncope    (Consider location/radiation/quality/duration/timing/severity/associated sxs/prior treatment) Patient is a 42 y.o. female presenting with syncope. The history is provided by the patient.  Loss of Consciousness Episode history:  Multiple Most recent episode:  Today Duration:  2 minutes Timing:  Constant Progression:  Improving Chronicity:  Recurrent Context: normal activity   Context: not blood draw, not bowel movement, not dehydration, not exertion, not medication change, not sight of blood, not sitting down, not standing up and not urination   Witnessed: no   Relieved by:  Nothing Worsened by:  Nothing tried Associated symptoms: dizziness and visual change   Associated symptoms: no anxiety, no chest pain, no diaphoresis, no difficulty breathing, no fever, no focal weakness, no headaches, no nausea, no recent fall, no recent injury, no shortness of breath, no vomiting and no weakness   Visual Change:    Location:  Both eyes   Quality: blurred vision     Onset quality:  Gradual   Severity:  Moderate   Progression:  Resolved Risk factors: no congenital heart disease, no coronary artery disease and no seizures     Past Medical History  Diagnosis Date  . Hyperlipidemia   . Diabetes mellitus   . Kidney stones   . Recurrent UTI   . Non-alcoholic fatty liver disease   . Weight decrease   . Chills   . Fatigue     loss of sleep  . Dizziness   . Generalized headaches   . Wears glasses   . Blood in urine   . Liver disease   . Poor appetite   . Abdominal pain   . Blurred vision     when feeling very fatigued   . Ovarian cyst     Past Surgical History  Procedure Laterality Date  . Cholecystectomy  2005  . Kidney stones    . Gastric bypass  10/2010  . Tubal ligation      x3  .  Abdominal hysterectomy  2008    for polycystic ovaries  . Cystostomy w/ bladder biopsy    . Exploratory laparotomy  09/01/11  . Laparoscopy  10/19/2011    Procedure: LAPAROSCOPY OPERATIVE;  Surgeon: Miguel Aschoff;  Location: [facility];  Service: Gynecology;  Laterality: N/A;  Operative Laparoscopy with Lysis Of Adhesions  . Laparotomy  10/19/2011    Procedure: LAPAROTOMY;  Surgeon: Miguel Aschoff;  Location: [facility];  Service: Gynecology;  Laterality: Bilateral;  Laparotomy With Bilateral Oophorectomy  . Bilateral salpingoophorectomy  2012    Dr Tenny Craw  . Colonoscopy      negative, done for pain  . Upper gastrointestinal endoscopy      to assess varices    Family History  Problem Relation Age of Onset  . Lung cancer Mother   . Cancer Mother     lung  . Hyperlipidemia Father   . Heart disease Father   . Stroke Father   . Hypertension Father   . Diabetes Father   . Hypertension Brother   . Other Brother     suicide    History  Substance Use Topics  . Smoking status: Never Smoker   . Smokeless tobacco: Not on file  . Alcohol Use: No    OB History   Grav Para Term Preterm  Abortions TAB SAB Ect Mult Living                  Review of Systems  Constitutional: Negative for fever and diaphoresis.  HENT: Negative for sore throat, neck pain and neck stiffness.   Eyes: Positive for visual disturbance. Negative for pain and redness.  Respiratory: Negative for shortness of breath.   Cardiovascular: Positive for syncope. Negative for chest pain.  Gastrointestinal: Negative for nausea, vomiting and abdominal pain.  Musculoskeletal: Negative for back pain.  Skin: Negative for rash and wound.  Neurological: Positive for dizziness. Negative for focal weakness, weakness and headaches.  All other systems reviewed and are negative.    Allergies  Review of patient's allergies indicates no known allergies.  Home Medications   Current Outpatient Rx  Name  Route  Sig  Dispense  Refill  .  ALLOPURINOL PO   Oral   Take by mouth.         . ALPRAZolam (XANAX) 0.5 MG tablet      TAKE 1 TABLET 3 TIMES A DAY   30 tablet   1   . buPROPion (WELLBUTRIN XL) 150 MG 24 hr tablet   Oral   Take 1 tablet (150 mg total) by mouth daily.   90 tablet   0     Please disregard refill for #30 with 3 refill.   . CHLOROTHIAZIDE PO      25 mg. Take 1/2 tablet daily.         . cyclobenzaprine (FLEXERIL) 10 MG tablet   Oral   Take 1 tablet (10 mg total) by mouth 2 (two) times daily as needed for muscle spasms.   20 tablet   0   . estradiol (ESTRACE) 1 MG tablet   Oral   Take 1 mg by mouth daily.         . hyoscyamine (LEVSIN, ANASPAZ) 0.125 MG tablet      TAKE 1 TABLET BY MOUTH EVERY 4 HOURS AS NEEDED FOR CRAMPING   30 tablet   0   . naproxen (NAPROSYN) 500 MG tablet   Oral   Take 1 tablet (500 mg total) by mouth 2 (two) times daily with a meal.   60 tablet   0   . ondansetron (ZOFRAN ODT) 4 MG disintegrating tablet   Oral   Take 1 tablet (4 mg total) by mouth every 8 (eight) hours as needed for nausea.   20 tablet   0   . EXPIRED: potassium chloride SA (K-DUR,KLOR-CON) 20 MEQ tablet   Oral   Take 1 tablet (20 mEq total) by mouth daily.   30 tablet   3   . promethazine (PHENERGAN) 25 MG tablet   Oral   Take 25 mg by mouth every 6 (six) hours as needed.         . rosuvastatin (CRESTOR) 20 MG tablet   Oral   Take 1 tablet (20 mg total) by mouth daily.   30 tablet   5   . traMADol (ULTRAM) 50 MG tablet   Oral   Take 1 tablet (50 mg total) by mouth every 6 (six) hours as needed for pain.   10 tablet   0   . valACYclovir (VALTREX) 1000 MG tablet      2 tabs x1 dose.  Repeat in 12 hrs.   4 tablet   0   . zolpidem (AMBIEN CR) 12.5 MG CR tablet   Oral   Take 1 tablet (  12.5 mg total) by mouth at bedtime as needed for sleep.   30 tablet   1     BP 114/74  Pulse 93  Temp(Src) 97.6 F (36.4 C) (Oral)  Resp 20  SpO2 100%  Physical Exam   Nursing note and vitals reviewed. Constitutional: She is oriented to person, place, and time. She appears well-developed and well-nourished. No distress.  HENT:  Head: Normocephalic and atraumatic.  Eyes: Conjunctivae and EOM are normal. No scleral icterus.  Neck: Normal range of motion. Neck supple.  Cardiovascular: Normal rate, regular rhythm and intact distal pulses.   No murmur heard. Pulmonary/Chest: Effort normal. No respiratory distress. She has no wheezes. She has no rales.  Abdominal: Soft. She exhibits no distension. There is no tenderness. There is no rebound.  Musculoskeletal: She exhibits no edema and no tenderness.  Neurological: She is alert and oriented to person, place, and time. No cranial nerve deficit.  Skin: Skin is warm and dry. No rash noted. She is not diaphoretic. No pallor.  Psychiatric: She has a normal mood and affect.    ED Course  Procedures (including critical care time)  Labs Reviewed - No data to display No results found.   1. Syncope   2. Facial numbness   3. Hypokalemia     EKG at time 09:25, shows normal sinus rhythm at a rate of 78, poor R-wave progression in lead V3, normal axis, no ST or T-wave abnormalities. EKG is unchanged from 04/13/2013.  MDM   I reviewed multiple clinic notes including lab tests that were drawn yesterday. She had minimally low potassium and sodium value at 3.1 and 133 respectively. Patient is currently taking 2 tablets of potassium twice daily for some known hypokalemia. She remains on chlorothiazide which she reports is to help prevent kidney stones which she has multiple history of in the past. Based on primary physician's assessment, she suspects patient has some underlying anxiety although the patient cannot think of any significant stressors. However in her notes, she was considering referring the patient to a neurologist and/or her cardiologist if symptoms persisted.  No sig concerns for TIA, CVA, seizure, MI.           Gavin Pound. Clarice Zulauf, MD 06/15/13 1016

## 2013-06-20 ENCOUNTER — Telehealth: Payer: Self-pay | Admitting: Family Medicine

## 2013-06-20 NOTE — Telephone Encounter (Signed)
Patient has bene advise to go to the ED.      KP

## 2013-06-20 NOTE — Telephone Encounter (Signed)
Patient Information:  Caller Name: Adlee  Phone: [phone]  Patient: [name]  Gender: Female  DOB: 1971-09-15  Age: 42 Years  PCP: Sheliah Hatch  Pregnant: No  Office Follow Up:  Does the office need to follow up with this patient?: No  Instructions For The Office: N/A  RN Note:  Pt states she does not want to go back to the ED at [facility] or [facility] and ask about [facility].  Pt refused 911 and states she if she does not call 911 she will have her husband to take her to the ED.  Symptoms  Reason For Call & Symptoms: Pt states she has had episodes of  passed out 06/15/13 approx 4-5 minutes seen in [place] ED, Sat 06/17/13 approx 4-5 min with severe headache, seen in the ED. Monday 06/19/13 approx 1-2 min seen in the ED again.  Numbness in hands and feet. Last seen in the office 06/14/13. Referral to neurology. Pt states she is having numbness,  N/V, headache.  Reviewed Health History In EMR: Yes  Reviewed Medications In EMR: Yes  Reviewed Allergies In EMR: Yes  Reviewed Surgeries / Procedures: Yes  Date of Onset of Symptoms: 06/15/2013 OB / GYN:  LMP: Unknown  Guideline(s) Used:  Headache  Disposition Per Guideline:   Call EMS 911 Now  Reason For Disposition Reached:   Difficult to awaken or acting confused (e.g., disoriented, slurred speech)  Advice Given:  N/A  Patient Will Follow Care Advice:  YES

## 2013-06-21 DIAGNOSIS — K37 Unspecified appendicitis: Secondary | ICD-10-CM

## 2013-06-21 HISTORY — DX: Unspecified appendicitis: K37

## 2013-06-22 ENCOUNTER — Encounter: Payer: Self-pay | Admitting: Family Medicine

## 2013-06-28 ENCOUNTER — Ambulatory Visit: Payer: Managed Care, Other (non HMO) | Admitting: Family Medicine

## 2013-07-03 ENCOUNTER — Telehealth: Payer: Self-pay | Admitting: *Deleted

## 2013-07-03 ENCOUNTER — Other Ambulatory Visit: Payer: Self-pay | Admitting: Family Medicine

## 2013-07-03 NOTE — Telephone Encounter (Signed)
Spoke with pt advised that I would forward disability paperwork to Neuro for them to complete at her upcoming appt. On Wed. 07/05/13

## 2013-07-03 NOTE — Telephone Encounter (Signed)
Patient called to follow-up on disability paperwork. States that she was admitted in to [facility] after her neurology issue worsened. She was out of work and her job sent the paperwork to our office. Patient sees her Neurologist on Wednesday 07/05/13 at 2pm, but her forms are also due Wednesday. She was unsure of who should fill them out but would like for [place] to fill them out.

## 2013-07-03 NOTE — Telephone Encounter (Signed)
Received disability paperwork from [facility] requesting office notes with physical exam from June 16, 2013 to present however pt has not been seen in the office since June 14, 2013. Left message on work vm for pt to return call to clarify what she is requesting disability for.

## 2013-07-04 NOTE — Telephone Encounter (Signed)
Ok to refill? Last OV 6.18.14 Last filled 5.28.14

## 2013-07-05 ENCOUNTER — Encounter: Payer: Self-pay | Admitting: Neurology

## 2013-07-05 ENCOUNTER — Ambulatory Visit (INDEPENDENT_AMBULATORY_CARE_PROVIDER_SITE_OTHER): Payer: Managed Care, Other (non HMO) | Admitting: Neurology

## 2013-07-05 ENCOUNTER — Other Ambulatory Visit: Payer: Self-pay | Admitting: Family Medicine

## 2013-07-05 VITALS — BP 106/80 | HR 88 | Temp 98.1°F | Ht 63.0 in | Wt 215.0 lb

## 2013-07-05 DIAGNOSIS — R202 Paresthesia of skin: Secondary | ICD-10-CM

## 2013-07-05 DIAGNOSIS — R209 Unspecified disturbances of skin sensation: Secondary | ICD-10-CM

## 2013-07-05 DIAGNOSIS — R51 Headache: Secondary | ICD-10-CM

## 2013-07-05 NOTE — Progress Notes (Signed)
NEUROLOGY CONSULTATION NOTE  [name] MRN: [id number] DOB: [birth year]  Referring physician: Dr. Beverely Low Primary care physician: Dr. Beverely Low  Reason for consult:  Paresthesias, headache, syncope  HISTORY OF PRESENT ILLNESS: [name] is a 42 y.o. female with history of diabetes mellitus, kidney stones, cirrhosis and depression, who presents for evaluation of syncope and numbness involving the face and hands.  EPIC notes, as well as outside notes from [facility] and [facility] were reviewed.  Onset of symptoms began one month ago. She noted a light prickly sensation involving both sides of her face and hands. It feels like that they are falling asleep. The sensation is constant but fluctuates. It does not involve other parts of the body. She denies any associated visual changes, weakness, or rash. She also has been experiencing episodic headaches. There is always a constant type of heaviness in her head, but she was experiencing episodes of severe headaches as well. It would usually occur at the back of the right side of her neck and would radiate to the whole head. It was a throbbing sensation that was an 8/10 intensity. It was associated with photophobia as well as nausea and vomiting. It is only his preceded by a funny feeling and she would feel clammy. She also had 2 syncopal episodes. One time, it happened at work. She started experiencing the headache and clammy feeling. She went to the bathroom in the next thing she knew, she was on the floor. A second time, it occurred in front of her daughter. She felt nauseous and was vomiting. Her daughter noted that she collapsed and was unconscious.  She seems to have a history of depression and anxiety in the past.  Dr. Beverely Low thought she was having panic attacks and prescribed her alprazolam, which was ineffective.  She does not anxiety and feeling of fear, but only after the numbness gets worse.    She has had recent visits to the ED for  syncope.  ECG has been unremarkable (NSR 78 bpm).  TSH 1.82, Hgb A1c 6.2, B12 426, K 3.1, folate 15.3.  She was found to be "borderline orthostatic".  It was suggested that she passed out due to dehydration and migraine.  She has also been to [facility] for syncope and paresthesias, where an MRI of the brain w/o gad was performed and was unremarkable.  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  She was diagnosed with migraines.  She was later admitted to [facility], where she had a thorough workup for headache, paresthesias and nausea.  TTE revealed LVEF 61%, US thyroid unremarkable, ammonia 24, cortisol 0.5, PTH 111, TSH 3.432, ESR 13, CRP 2.5, B12 425, and zinc 65.  She was ultimately diagnosed with migraine and viral duodenitis.  She was started on topiramate 25mg  BID.  She denies prior history of migraines or headaches.  She denies family history of migraines.  She denies spending time in the woods.  She denies fever, chills.  She previously was seen by rheumatology for generalized aches and positive ANA, but no further workup was pursued.   Medications include alprazolam, bupropion, topiramate, and zolpidem.  MRI of brain w/wo performed 11/04/10 for right sided numbness was reviewed and was normal.  She notes that that episode was different since it was sudden onset and involved the entire right side of the body.  PAST MEDICAL HISTORY: Past Medical History  Diagnosis Date  . Hyperlipidemia   . Diabetes mellitus   .  Kidney stones   . Recurrent UTI   . Non-alcoholic fatty liver disease   . Weight decrease   . Chills   . Fatigue     loss of sleep  . Dizziness   . Generalized headaches   . Wears glasses   . Blood in urine   . Liver disease   . Poor appetite   . Abdominal pain   . Blurred vision     when feeling very fatigued   . Ovarian cyst     PAST SURGICAL HISTORY: Past Surgical History  Procedure Laterality Date  . Cholecystectomy  2005  . Kidney stones    . Gastric  bypass  10/2010  . Tubal ligation      x3  . Abdominal hysterectomy  2008    for polycystic ovaries  . Cystostomy w/ bladder biopsy    . Exploratory laparotomy  09/01/11  . Laparoscopy  10/19/2011    Procedure: LAPAROSCOPY OPERATIVE;  Surgeon: Miguel Aschoff;  Location: [facility];  Service: Gynecology;  Laterality: N/A;  Operative Laparoscopy with Lysis Of Adhesions  . Laparotomy  10/19/2011    Procedure: LAPAROTOMY;  Surgeon: Miguel Aschoff;  Location: [facility];  Service: Gynecology;  Laterality: Bilateral;  Laparotomy With Bilateral Oophorectomy  . Bilateral salpingoophorectomy  2012    Dr Tenny Craw  . Colonoscopy      negative, done for pain  . Upper gastrointestinal endoscopy      to assess varices    MEDICATIONS: Current Outpatient Prescriptions on File Prior to Visit  Medication Sig Dispense Refill  . ALLOPURINOL PO Take by mouth.      . ALPRAZolam (XANAX) 0.5 MG tablet TAKE ONE TABLET BY MOUTH 3 TIMES DAILY  30 tablet  0  . buPROPion (WELLBUTRIN XL) 150 MG 24 hr tablet Take 1 tablet (150 mg total) by mouth daily.  90 tablet  0  . CHLOROTHIAZIDE PO 25 mg. Take 1/2 tablet daily.      . cyclobenzaprine (FLEXERIL) 10 MG tablet Take 1 tablet (10 mg total) by mouth 2 (two) times daily as needed for muscle spasms.  20 tablet  0  . estradiol (ESTRACE) 1 MG tablet Take 1 mg by mouth daily.      . hyoscyamine (LEVSIN, ANASPAZ) 0.125 MG tablet TAKE 1 TABLET BY MOUTH EVERY 4 HOURS AS NEEDED FOR CRAMPING  30 tablet  0  . promethazine (PHENERGAN) 25 MG tablet Take 25 mg by mouth every 6 (six) hours as needed.      . rosuvastatin (CRESTOR) 20 MG tablet Take 1 tablet (20 mg total) by mouth daily.  30 tablet  5  . zolpidem (AMBIEN CR) 12.5 MG CR tablet Take 1 tablet (12.5 mg total) by mouth at bedtime as needed for sleep.  30 tablet  1  . potassium chloride SA (K-DUR,KLOR-CON) 20 MEQ tablet Take 1 tablet (20 mEq total) by mouth daily.  30 tablet  3  . valACYclovir (VALTREX) 1000 MG tablet 2 tabs x1 dose.  Repeat  in 12 hrs.  4 tablet  0   No current facility-administered medications on file prior to visit.    ALLERGIES: No Known Allergies  FAMILY HISTORY: Family History  Problem Relation Age of Onset  . Lung cancer Mother   . Cancer Mother     lung  . Hyperlipidemia Father   . Heart disease Father   . Stroke Father   . Hypertension Father   . Diabetes Father   . Hypertension Brother   .  Other Brother     suicide    SOCIAL HISTORY: History   Social History  . Marital Status: Married    Spouse Name: N/A    Number of Children: N/A  . Years of Education: N/A   Occupational History  . Not on file.   Social History Main Topics  . Smoking status: Never Smoker   . Smokeless tobacco: Never Used  . Alcohol Use: No     Comment: never  . Drug Use: No  . Sexually Active: Yes    Birth Control/ Protection: Surgical   Other Topics Concern  . Not on file   Social History Narrative  . No narrative on file    REVIEW OF SYSTEMS: Constitutional: No fevers, chills, or sweats, no generalized fatigue, change in appetite Eyes: No visual changes, double vision, eye pain Ear, nose and throat: No hearing loss, ear pain, nasal congestion, sore throat Cardiovascular: No chest pain, palpitations Respiratory:  No shortness of breath at rest or with exertion, wheezes GastrointestinaI: No nausea, vomiting, diarrhea, abdominal pain, fecal incontinence Genitourinary:  No dysuria, urinary retention or frequency Musculoskeletal:  No neck pain, back pain Integumentary: No rash, pruritus, skin lesions Neurological: as above Psychiatric: No depression, insomnia, anxiety Endocrine: No palpitations, fatigue, diaphoresis, mood swings, change in appetite, change in weight, increased thirst Hematologic/Lymphatic:  No anemia, purpura, petechiae. Allergic/Immunologic: no itchy/runny eyes, nasal congestion, recent allergic reactions, rashes      PHYSICAL EXAM: Filed Vitals:   07/05/13 1255  BP:  106/80  Pulse: 88  Temp: 98.1 F (36.7 C)   General: No acute distress Head:  Normocephalic/atraumatic Neck: supple, no paraspinal tenderness, full range of motion Back: No paraspinal tenderness Heart: regular rate and rhythm Lungs: Clear to auscultation bilaterally. Neurological Exam: Mental status: alert and oriented to person, place, time and self, speech fluent and not dysarthric, language intact. Cranial nerves: CN I: not tested CN II: pupils equal, round and reactive to light, visual fields intact, fundi unremarkable CN III, IV, VI:  full range of motion, no nystagmus, no ptosis CN V: facial sensation intact CN VII: upper and lower face symmetric CN VIII: hearing intact CN IX, X: gag intact, uvula midline CN XI: sternocleidomastoid and trapezius muscles intact CN XII: tongue midline Bulk & Tone: normal, no fasciculations. Muscle strength: 5/5 throughout Sensation: pinprick and vibration intact Deep Tendon Reflexes: 2+ throughout, toes down-going Finger to nose testing: normal Gait: normal stride, no ataxia. Romberg negative.  IMPRESSION & PLAN: [name] is a 42 y.o. female with multiple issues: 1.  Episodes of loss of consciousness: they sound like syncope.  Does not sound neurological.  Seizures unlikely. 2.  Constant paresthesias of face and hands:  I have no answer to this. MRI ruled out central neurological etiology.  We will look for a peripheral cause, by scheduling NCS/EMG and checking for Lyme as well.  Ultimately, we may not be able to find a neurological cause of these symptoms. 3.  Migraine.  She has noted improvement with topiramate, however with her history of recurrent kidney stones, I don't feel this is the best option for her.  We will wean her off of topiramate and start nortriptyline 25mg  qhs.  Side effects discussed.    4.  Follow up in one month    Thank you for allowing me to take part in the care of this patient.  Shon Millet, DO  CC:  Neena Rhymes, MD

## 2013-07-05 NOTE — Patient Instructions (Addendum)
1.  We will schedule for EMG to look for evidence of nerve problems. 2.  Since you have history of recurrent kidney stones, we will slowly stop the topamax.  Take 1 pill daily for 3 days and then stop. 3.  We will start nortriptyline 25mg  at bedtime.  3.  I will review the notes from [facility]. 4.  Further tests pending results of EMG. 5.  Check Lyme. 6.  Follow up in one month. 7.  Call with any questions or concerns.  Your NCV/EMG will be scheduled scheduled in our office sometime in August. We will call you to schedule that appointment.

## 2013-07-06 ENCOUNTER — Inpatient Hospital Stay (HOSPITAL_COMMUNITY)
Admission: AD | Admit: 2013-07-06 | Discharge: 2013-07-06 | Disposition: A | Discharge status: Home / Self Care | Payer: Managed Care, Other (non HMO) | Source: Ambulatory Visit | Attending: Obstetrics and Gynecology | Admitting: Obstetrics and Gynecology

## 2013-07-06 ENCOUNTER — Ambulatory Visit: Payer: Managed Care, Other (non HMO) | Admitting: Family Medicine

## 2013-07-06 DIAGNOSIS — R112 Nausea with vomiting, unspecified: Secondary | ICD-10-CM

## 2013-07-06 DIAGNOSIS — R1031 Right lower quadrant pain: Secondary | ICD-10-CM

## 2013-07-06 DIAGNOSIS — Z90710 Acquired absence of both cervix and uterus: Secondary | ICD-10-CM | POA: Insufficient documentation

## 2013-07-06 DIAGNOSIS — R1903 Right lower quadrant abdominal swelling, mass and lump: Secondary | ICD-10-CM | POA: Insufficient documentation

## 2013-07-06 LAB — URINALYSIS, ROUTINE W REFLEX MICROSCOPIC
Bilirubin Urine: NEGATIVE
Leukocytes, UA: NEGATIVE
Nitrite: NEGATIVE
Specific Gravity, Urine: 1.025 (ref 1.005–1.030)
Urobilinogen, UA: 0.2 mg/dL (ref 0.0–1.0)

## 2013-07-06 LAB — CBC
MCH: 28.6 pg (ref 26.0–34.0)
MCHC: 34.5 g/dL (ref 30.0–36.0)
Platelets: 118 10*3/uL — ABNORMAL LOW (ref 150–400)
RBC: 4.33 MIL/uL (ref 3.87–5.11)

## 2013-07-06 LAB — B. BURGDORFI ANTIBODIES: B burgdorferi Ab IgG+IgM: 0.17 {ISR}

## 2013-07-06 MED ORDER — HYDROMORPHONE HCL 2 MG PO TABS
2.0000 mg | ORAL_TABLET | Freq: Once | ORAL | Status: AC
  Administered 2013-07-06: 2 mg via ORAL
  Filled 2013-07-06: qty 1

## 2013-07-06 MED ORDER — HYDROMORPHONE HCL PF 2 MG/ML IJ SOLN
2.0000 mg | Freq: Once | INTRAMUSCULAR | Status: DC
  Administered 2013-07-06: 2 mg via INTRAVENOUS
  Filled 2013-07-06: qty 1

## 2013-07-06 MED ORDER — HYDROMORPHONE HCL 2 MG PO TABS: 2.0000 mg | ORAL_TABLET | ORAL | Status: DC | PRN

## 2013-07-06 MED ORDER — METOCLOPRAMIDE HCL 5 MG/ML IJ SOLN
10.0000 mg | Freq: Once | INTRAMUSCULAR | Status: AC
  Administered 2013-07-06: 10 mg via INTRAMUSCULAR
  Filled 2013-07-06: qty 2

## 2013-07-06 MED ORDER — METOCLOPRAMIDE HCL 10 MG PO TABS: 10.0000 mg | ORAL_TABLET | Freq: Three times a day (TID) | ORAL | Status: DC

## 2013-07-06 MED ORDER — HYDROMORPHONE HCL PF 1 MG/ML IJ SOLN: 2.0000 mg | Freq: Once | INTRAMUSCULAR | Status: DC

## 2013-07-06 NOTE — MAU Provider Note (Signed)
Chief Complaint: Abdominal Pain  First Provider Initiated Contact with Patient 07/06/13 [phone]     SUBJECTIVE HPI: [name] is a 42 y.o. non-pregnant female who presents to MAU with RLQ pain w/ gradual onset over the past few months that was Dx'd as a mass of residual ovarian tissue and N/V during hospitalization at [facility] two weeks ago for HA and syncopal episodes per pt. Appendicitis ruled out on CT. (Hysterectomy 2007, BSO 2012 by Dr. Tenny Craw) State she was told she would need surgery. Instructed to F/U w/ her gynecologist Dr. Donovan Kail.  She saw Dr. Tenny Craw last week. Started on OCPs to attempt to shrink the mass. Dr. Tenny Craw reviewed the CT results. Planned to have her F/U in office in 2 weeks to eval response to OCPs, but pts pain worsened and she could not wait.  Sx uncharged since hospitalization except for increased pain.    Past Medical History  Diagnosis Date  . Hyperlipidemia   . Diabetes mellitus   . Kidney stones   . Recurrent UTI   . Non-alcoholic fatty liver disease   . Weight decrease   . Chills   . Fatigue     loss of sleep  . Dizziness   . Generalized headaches   . Wears glasses   . Blood in urine   . Liver disease   . Poor appetite   . Abdominal pain   . Blurred vision     when feeling very fatigued   . Ovarian cyst    OB History   Grav Para Term Preterm Abortions TAB SAB Ect Mult Living                 Past Surgical History  Procedure Laterality Date  . Cholecystectomy  2005  . Kidney stones    . Gastric bypass  10/2010  . Tubal ligation      x3  . Abdominal hysterectomy  2008    for polycystic ovaries  . Cystostomy w/ bladder biopsy    . Exploratory laparotomy  09/01/11  . Laparoscopy  10/19/2011    Procedure: LAPAROSCOPY OPERATIVE;  Surgeon: Miguel Aschoff;  Location: [facility];  Service: Gynecology;  Laterality: N/A;  Operative Laparoscopy with Lysis Of Adhesions  . Laparotomy  10/19/2011    Procedure: LAPAROTOMY;  Surgeon: Miguel Aschoff;  Location: [facility];   Service: Gynecology;  Laterality: Bilateral;  Laparotomy With Bilateral Oophorectomy  . Bilateral salpingoophorectomy  2012    Dr Tenny Craw  . Colonoscopy      negative, done for pain  . Upper gastrointestinal endoscopy      to assess varices   History   Social History  . Marital Status: Married    Spouse Name: N/A    Number of Children: N/A  . Years of Education: N/A   Occupational History  . Not on file.   Social History Main Topics  . Smoking status: Never Smoker   . Smokeless tobacco: Never Used  . Alcohol Use: No     Comment: never  . Drug Use: No  . Sexually Active: Yes    Birth Control/ Protection: Surgical   Other Topics Concern  . Not on file   Social History Narrative  . No narrative on file   No current facility-administered medications on file prior to encounter.   Current Outpatient Prescriptions on File Prior to Encounter  Medication Sig Dispense Refill  . buPROPion (WELLBUTRIN XL) 150 MG 24 hr tablet Take 1 tablet (150 mg total)  by mouth daily.  90 tablet  0  . metoCLOPramide (REGLAN) 10 MG tablet Take 5 mg by mouth 4 (four) times daily as needed (nausea and vomiting).       . pantoprazole (PROTONIX) 40 MG tablet Take 40 mg by mouth 2 (two) times daily.       . rosuvastatin (CRESTOR) 20 MG tablet Take 1 tablet (20 mg total) by mouth daily.  30 tablet  5  . topiramate (TOPAMAX) 25 MG tablet Take 25 mg by mouth 2 (two) times daily.       Marland Kitchen estradiol (ESTRACE) 1 MG tablet Take 1 mg by mouth daily.       No Known Allergies  ROS: Pertinent pos items in HPI. Neg for fever, chills, loss of appetite, constipation, diarrhea or urinary complaints.   OBJECTIVE Blood pressure 124/80, pulse 100, temperature 98.4 F (36.9 C), temperature source Oral, resp. rate 18, height 5\' 3"  (1.6 m), weight 98.431 kg (217 lb), SpO2 100.00%. GENERAL: Well-developed, well-nourished female in moderate distress.  HEENT: Normocephalic HEART: normal rate RESP: normal effort ABDOMEN:  Soft, moderate RLQ tenderness superior and closer to ML than McBurney's point. Pos BS. Neg rebound. Pea-sized mass palpated superficially at site of pain.  EXTREMITIES: Nontender, no edema NEURO: Alert and oriented SPECULUM EXAM: deferred.  LAB RESULTS Results for orders placed during the hospital encounter of 07/06/13 (from the past 24 hour(s))  URINALYSIS, ROUTINE W REFLEX MICROSCOPIC     Status: None   Collection Time    07/06/13  5:37 AM      Result Value Range   Color, Urine YELLOW  YELLOW   APPearance CLEAR  CLEAR   Specific Gravity, Urine 1.025  1.005 - 1.030   pH 6.0  5.0 - 8.0   Glucose, UA NEGATIVE  NEGATIVE mg/dL   Hgb urine dipstick NEGATIVE  NEGATIVE   Bilirubin Urine NEGATIVE  NEGATIVE   Ketones, ur NEGATIVE  NEGATIVE mg/dL   Protein, ur NEGATIVE  NEGATIVE mg/dL   Urobilinogen, UA 0.2  0.0 - 1.0 mg/dL   Nitrite NEGATIVE  NEGATIVE   Leukocytes, UA NEGATIVE  NEGATIVE  CBC     Status: Abnormal   Collection Time    07/06/13  6:35 AM      Result Value Range   WBC 5.6  4.0 - 10.5 K/uL   RBC 4.33  3.87 - 5.11 MIL/uL   Hemoglobin 12.4  12.0 - 15.0 g/dL   HCT 56.2 (*) 13.0 - 86.5 %   MCV 82.9  78.0 - 100.0 fL   MCH 28.6  26.0 - 34.0 pg   MCHC 34.5  30.0 - 36.0 g/dL   RDW 78.4  69.6 - 29.5 %   Platelets 118 (*) 150 - 400 K/uL    IMAGING No results found.  MAU COURSE 0705: Notified Dr. Donovan Kail of pt's worsening RLQ pain, N/V, Hx. Dr. Tenny Craw familiar w/ pt. May give pain meds, but he will not be performing surgery on her due to her extensive surgical Hx, complex medical Hx and high potential for complicated surgery. Recommends that pt return to [facility] where her records are or may call office for referral to [facility]. Dilaudid and Reglan ordered.   0845: Pain decreased to 6. Pt frustrated w/ the suggestion to go back to [facility]. Lengthy discussion w/ Pt RE: the importance of using the most appropriate surgeon(s) at the most appropriate facility and that no  life-threatening condition requiring emergency surgery is present. Pt does not  think the can tolerating waiting a long time to establish care w/ another gynecologist. Dr. Tenny Craw informed. Pt can come to office now to try to streamline referral process.   ASSESSMENT 1. RLQ abdominal pain   2. RLQ abdominal mass   3. Nausea & vomiting    PLAN Discharge home in stable condition per consult w/ Dr. Tenny Craw.      Follow-up Information   Follow up with [facility], MD. (go to Dr. Charlott Rakes office now to arrange referral to [facility] surgeon. )    Contact information:   [address] [place] [address] [phone]        Medication List    STOP taking these medications       potassium chloride SA 20 MEQ tablet  Commonly known as:  K-DUR,KLOR-CON      TAKE these medications       allopurinol 100 MG tablet  Commonly known as:  ZYLOPRIM  Take 100 mg by mouth daily.     ALPRAZolam 0.5 MG tablet  Commonly known as:  XANAX  Take 0.5 mg by mouth 3 (three) times daily as needed for sleep or anxiety.     buPROPion 150 MG 24 hr tablet  Commonly known as:  WELLBUTRIN XL  Take 1 tablet (150 mg total) by mouth daily.     ESTRACE 1 MG tablet  Generic drug:  estradiol  Take 1 mg by mouth daily.     hydrochlorothiazide 25 MG tablet  Commonly known as:  HYDRODIURIL  Take 12.5 mg by mouth daily.     HYDROmorphone 2 MG tablet  Commonly known as:  DILAUDID  Take 1 tablet (2 mg total) by mouth every 4 (four) hours as needed for pain.     metoCLOPramide 10 MG tablet  Commonly known as:  REGLAN  Take 5 mg by mouth 4 (four) times daily as needed (nausea and vomiting).     metoCLOPramide 10 MG tablet  Commonly known as:  REGLAN  Take 1 tablet (10 mg total) by mouth 3 (three) times daily with meals.     pantoprazole 40 MG tablet  Commonly known as:  PROTONIX  Take 40 mg by mouth 2 (two) times daily.     rosuvastatin 20 MG tablet  Commonly known as:  CRESTOR  Take 1 tablet  (20 mg total) by mouth daily.     topiramate 25 MG tablet  Commonly known as:  TOPAMAX  Take 25 mg by mouth 2 (two) times daily.     zolpidem 12.5 MG CR tablet  Commonly known as:  AMBIEN CR  Take 12.5 mg by mouth at bedtime as needed for sleep.       [place], CNM 07/06/2013  9:08 AM

## 2013-07-06 NOTE — Telephone Encounter (Signed)
Patient was scheduled for an acute appt today but cancelled. Refilled Crestor for one month with note to schedule and need labs. The notes show that she was admitted to [facility] yesterday and discharged today. Last refill 06/06/2013

## 2013-07-06 NOTE — Discharge Instructions (Signed)
Abdominal Pain (Nonspecific)  Your exam might not show the exact reason you have abdominal pain. Since there are many different causes of abdominal pain, another checkup and more tests may be needed. It is very important to follow up for lasting (persistent) or worsening symptoms. A possible cause of abdominal pain in any person who still has his or her appendix is acute appendicitis. Appendicitis is often hard to diagnose. Normal blood tests, urine tests, ultrasound, and CT scans do not completely rule out early appendicitis or other causes of abdominal pain. Sometimes, only the changes that happen over time will allow appendicitis and other causes of abdominal pain to be determined. Other potential problems that may require surgery may also take time to become more apparent. Because of this, it is important that you follow all of the instructions below.  HOME CARE INSTRUCTIONS    Rest as much as possible.   Do not eat solid food until your pain is gone.   While adults or children have pain: A diet of water, weak decaffeinated tea, broth or bouillon, gelatin, oral rehydration solutions (ORS), frozen ice pops, or ice chips may be helpful.   When pain is gone in adults or children: Start a light diet (dry toast, crackers, applesauce, or white rice). Increase the diet slowly as long as it does not bother you. Eat no dairy products (including cheese and eggs) and no spicy, fatty, fried, or high-fiber foods.   Use no alcohol, caffeine, or cigarettes.   Take your regular medicines unless your caregiver told you not to.   Take any prescribed medicine as directed.   Only take over-the-counter or prescription medicines for pain, discomfort, or fever as directed by your caregiver. Do not give aspirin to children.  If your caregiver has given you a follow-up appointment, it is very important to keep that appointment. Not keeping the appointment could result in a permanent injury and/or lasting (chronic) pain  and/or disability. If there is any problem keeping the appointment, you must call to reschedule.   SEEK IMMEDIATE MEDICAL CARE IF:    Your pain is not gone in 24 hours.   Your pain becomes worse, changes location, or feels different.   You or your child has an oral temperature above 102 F (38.9 C), not controlled by medicine.   Your baby is older than 3 months with a rectal temperature of 102 F (38.9 C) or higher.   Your baby is 3 months old or younger with a rectal temperature of 100.4 F (38 C) or higher.   You have shaking chills.   You keep throwing up (vomiting) or cannot drink liquids.   There is blood in your vomit or you see blood in your bowel movements.   Your bowel movements become dark or black.   You have frequent bowel movements.   Your bowel movements stop (become blocked) or you cannot pass gas.   You have bloody, frequent, or painful urination.   You have yellow discoloration in the skin or whites of the eyes.   Your stomach becomes bloated or bigger.   You have dizziness or fainting.   You have chest or back pain.  MAKE SURE YOU:    Understand these instructions.   Will watch your condition.   Will get help right away if you are not doing well or get worse.  Document Released: 12/14/2005 Document Revised: 03/07/2012 Document Reviewed: 11/11/2009  [employer] Patient Information 2014 [employer].

## 2013-07-06 NOTE — MAU Note (Signed)
Pt reports pain on rt lower abd for weeks, was admitted for headaches at [facility] and began having the rt side pain, CT showed mass on rt ovary (pt had uterus removed in 2007, and ovaries removed in 2012). Was told to follow up with dr Duane Lope, seen in his office last week and was to follow up in 2 weeks but pain is worsening and having nausea and vomiting.

## 2013-07-06 NOTE — MAU Note (Signed)
Unable to assess for adverse reaction to po dilaudid, pt to go directly to Dr. Tenny Craw' office.

## 2013-07-07 ENCOUNTER — Other Ambulatory Visit: Payer: Self-pay

## 2013-07-07 ENCOUNTER — Telehealth: Payer: Self-pay | Admitting: Neurology

## 2013-07-07 MED ORDER — NORTRIPTYLINE HCL 25 MG PO CAPS: 25.0000 mg | ORAL_CAPSULE | Freq: Every day | ORAL | Status: DC

## 2013-07-07 NOTE — Telephone Encounter (Signed)
Left a message that blood work was normal.

## 2013-07-07 NOTE — Telephone Encounter (Signed)
Message copied by Benay Spice on Fri Jul 07, 2013 11:29 AM ------      Message from: JAFFE, ADAM R      Created: Fri Jul 07, 2013  7:41 AM       Please let Ms. Kinn know that her Lyme test is negative.        ----- Message -----         From: [facility] Interface         Sent: 07/06/2013  12:56 PM           To: Cira Servant, MD                   ------

## 2013-07-07 NOTE — Telephone Encounter (Signed)
Pt calling because pamelor not at her pharmacy, sent in and she is aware.

## 2013-07-13 DIAGNOSIS — N949 Unspecified condition associated with female genital organs and menstrual cycle: Secondary | ICD-10-CM

## 2013-07-13 HISTORY — DX: Unspecified condition associated with female genital organs and menstrual cycle: N94.9

## 2013-07-19 DIAGNOSIS — N9983 Residual ovary syndrome: Secondary | ICD-10-CM

## 2013-07-19 HISTORY — DX: Residual ovary syndrome: N99.83

## 2013-07-26 ENCOUNTER — Other Ambulatory Visit: Payer: Self-pay | Admitting: Family Medicine

## 2013-07-28 NOTE — Telephone Encounter (Signed)
Last refill:07-14-13 Last OV:06-14-13 Last UDS:05-01-13-High Risk Please advise.//AB/CMA

## 2013-07-31 ENCOUNTER — Other Ambulatory Visit: Payer: Self-pay

## 2013-07-31 ENCOUNTER — Telehealth: Payer: Self-pay | Admitting: *Deleted

## 2013-07-31 MED ORDER — ALPRAZOLAM 0.5 MG PO TABS: ORAL_TABLET | ORAL | Status: DC

## 2013-07-31 NOTE — Telephone Encounter (Signed)
Pharmacy's requesting a refill for Alprazolam 0.5 mg Last date filled 07/05/13 #30 1-R Last ov 07/05/2013 last UDS 06/14/13 patient has a controlled substance contract on file. Please Advise

## 2013-07-31 NOTE — Telephone Encounter (Signed)
Was ok'd for #30, 1 refill but pt needs UDS (sent via RX Request).  Please make sure duplicate scripts aren't sent.

## 2013-07-31 NOTE — Telephone Encounter (Signed)
Printed and placed for signature.

## 2013-07-31 NOTE — Telephone Encounter (Signed)
Ok for #30, 1 refill- needs UDS

## 2013-08-03 DIAGNOSIS — F329 Major depressive disorder, single episode, unspecified: Secondary | ICD-10-CM

## 2013-08-03 DIAGNOSIS — Z8639 Personal history of other endocrine, nutritional and metabolic disease: Secondary | ICD-10-CM

## 2013-08-03 DIAGNOSIS — Z8669 Personal history of other diseases of the nervous system and sense organs: Secondary | ICD-10-CM | POA: Insufficient documentation

## 2013-08-03 HISTORY — DX: Personal history of other endocrine, nutritional and metabolic disease: Z86.39

## 2013-08-03 HISTORY — DX: Personal history of other diseases of the nervous system and sense organs: Z86.69

## 2013-08-03 HISTORY — DX: Major depressive disorder, single episode, unspecified: F32.9

## 2013-08-11 DIAGNOSIS — T8149XA Infection following a procedure, other surgical site, initial encounter: Secondary | ICD-10-CM

## 2013-08-11 HISTORY — DX: Infection following a procedure, other surgical site, initial encounter: T81.49XA

## 2013-08-23 ENCOUNTER — Other Ambulatory Visit: Payer: Self-pay | Admitting: Family Medicine

## 2013-08-24 ENCOUNTER — Other Ambulatory Visit: Payer: Self-pay | Admitting: Neurology

## 2013-08-24 MED ORDER — NORTRIPTYLINE HCL 25 MG PO CAPS: 25.0000 mg | ORAL_CAPSULE | Freq: Every day | ORAL | Status: DC

## 2013-08-24 NOTE — Telephone Encounter (Signed)
Last OV: 06/14/2013 Last Filled: 07/31/2013 UDS-06/14/2013-High Risk Contract on File  Please Advise. SW, CMA

## 2013-08-24 NOTE — Telephone Encounter (Signed)
Ok for #30 but needs UDS

## 2013-08-25 NOTE — Telephone Encounter (Signed)
Med filled, called pt to advise Rx is ready at front desk for pick up. Needs UDS.

## 2013-09-04 ENCOUNTER — Other Ambulatory Visit: Payer: Self-pay | Admitting: General Practice

## 2013-09-04 ENCOUNTER — Telehealth: Payer: Self-pay | Admitting: General Practice

## 2013-09-04 MED ORDER — ZOLPIDEM TARTRATE ER 12.5 MG PO TBCR: 12.5000 mg | EXTENDED_RELEASE_TABLET | Freq: Every evening | ORAL | Status: DC | PRN

## 2013-09-04 NOTE — Telephone Encounter (Signed)
Ok for #30, 1 refill 

## 2013-09-04 NOTE — Telephone Encounter (Signed)
Ambien refill Last OV 06-14-13 Med filled 06-14-13   Pt in office for UDS and to pick up xanax refill

## 2013-09-04 NOTE — Telephone Encounter (Signed)
Noted med filled, pt picked up.

## 2013-09-06 ENCOUNTER — Ambulatory Visit: Payer: Managed Care, Other (non HMO) | Admitting: Family Medicine

## 2013-09-14 ENCOUNTER — Encounter: Payer: Self-pay | Admitting: Family Medicine

## 2013-09-15 ENCOUNTER — Telehealth: Payer: Self-pay | Admitting: Family Medicine

## 2013-09-15 NOTE — Telephone Encounter (Addendum)
Patient dismissed from [facility] by Neena Rhymes MD , effective September 14, 2013. Dismissal letter sent out by certified / registered mail. DAJ  Received signed domestic return receipt verifying delivery of certified letter on September 19, 2013. Article number [id number] [phone] DAJ

## 2013-09-18 ENCOUNTER — Telehealth: Payer: Self-pay | Admitting: Neurology

## 2013-09-18 NOTE — Telephone Encounter (Signed)
Pt needs NCV/EMG w/ Dr. Everlena Cooper. LM for pt to call for appt. Currently awaiting return call / Sherri S.

## 2013-09-19 ENCOUNTER — Other Ambulatory Visit: Payer: Self-pay | Admitting: Family Medicine

## 2013-09-19 ENCOUNTER — Encounter: Payer: Self-pay | Admitting: Family Medicine

## 2013-09-22 ENCOUNTER — Other Ambulatory Visit: Payer: Self-pay | Admitting: Family Medicine

## 2013-09-22 NOTE — Telephone Encounter (Signed)
DENIED. No longer with practice.

## 2013-09-27 ENCOUNTER — Other Ambulatory Visit: Payer: Self-pay | Admitting: Family Medicine

## 2013-10-27 ENCOUNTER — Other Ambulatory Visit: Payer: Self-pay | Admitting: Family Medicine

## 2013-11-02 ENCOUNTER — Other Ambulatory Visit: Payer: Self-pay

## 2013-11-17 ENCOUNTER — Encounter: Payer: Self-pay | Admitting: Family Medicine

## 2014-01-17 DIAGNOSIS — E669 Obesity, unspecified: Secondary | ICD-10-CM | POA: Insufficient documentation

## 2014-01-17 DIAGNOSIS — K561 Intussusception: Secondary | ICD-10-CM

## 2014-01-17 HISTORY — DX: Obesity, unspecified: E66.9

## 2014-01-17 HISTORY — DX: Intussusception: K56.1

## 2014-02-28 DIAGNOSIS — Z9884 Bariatric surgery status: Secondary | ICD-10-CM

## 2014-02-28 HISTORY — DX: Bariatric surgery status: Z98.84

## 2014-04-25 ENCOUNTER — Telehealth (HOSPITAL_COMMUNITY): Payer: Self-pay

## 2014-04-25 NOTE — Telephone Encounter (Signed)
This patient is overdue for recommended follow-up with a bariatric surgeon at [facility]. Call attempted today to reestablish post-op care with [facility], but unable to reach patient by phone.  A letter will be mailed to the patient today to the address on file from [place] advising the patient on the benefits of follow-up care and directing them to call [facility] at [phone] to schedule an appointment at their earliest convenience.  ° °Amanda T. Fleming °Bariatric Office Coordinator °[phone] ° °

## 2014-05-07 ENCOUNTER — Emergency Department (HOSPITAL_BASED_OUTPATIENT_CLINIC_OR_DEPARTMENT_OTHER)
Admission: EM | Admit: 2014-05-07 | Discharge: 2014-05-07 | Disposition: A | Discharge status: Home / Self Care | Payer: Managed Care, Other (non HMO) | Attending: Emergency Medicine | Admitting: Emergency Medicine

## 2014-05-07 ENCOUNTER — Encounter (HOSPITAL_BASED_OUTPATIENT_CLINIC_OR_DEPARTMENT_OTHER): Payer: Self-pay | Admitting: Emergency Medicine

## 2014-05-07 DIAGNOSIS — R630 Anorexia: Secondary | ICD-10-CM | POA: Insufficient documentation

## 2014-05-07 DIAGNOSIS — Z8742 Personal history of other diseases of the female genital tract: Secondary | ICD-10-CM | POA: Insufficient documentation

## 2014-05-07 DIAGNOSIS — Z3202 Encounter for pregnancy test, result negative: Secondary | ICD-10-CM | POA: Insufficient documentation

## 2014-05-07 DIAGNOSIS — I10 Essential (primary) hypertension: Secondary | ICD-10-CM | POA: Insufficient documentation

## 2014-05-07 DIAGNOSIS — N200 Calculus of kidney: Secondary | ICD-10-CM

## 2014-05-07 DIAGNOSIS — Z87440 Personal history of urinary (tract) infections: Secondary | ICD-10-CM | POA: Insufficient documentation

## 2014-05-07 DIAGNOSIS — Z789 Other specified health status: Secondary | ICD-10-CM | POA: Insufficient documentation

## 2014-05-07 DIAGNOSIS — Z79899 Other long term (current) drug therapy: Secondary | ICD-10-CM | POA: Insufficient documentation

## 2014-05-07 DIAGNOSIS — K769 Liver disease, unspecified: Secondary | ICD-10-CM | POA: Insufficient documentation

## 2014-05-07 DIAGNOSIS — I809 Phlebitis and thrombophlebitis of unspecified site: Secondary | ICD-10-CM | POA: Insufficient documentation

## 2014-05-07 DIAGNOSIS — E119 Type 2 diabetes mellitus without complications: Secondary | ICD-10-CM | POA: Insufficient documentation

## 2014-05-07 DIAGNOSIS — E785 Hyperlipidemia, unspecified: Secondary | ICD-10-CM | POA: Insufficient documentation

## 2014-05-07 HISTORY — DX: Phlebitis and thrombophlebitis of unspecified site: I80.9

## 2014-05-07 LAB — CBC WITH DIFFERENTIAL/PLATELET
BASOS ABS: 0 10*3/uL (ref 0.0–0.1)
Basophils Relative: 1 % (ref 0–1)
EOS ABS: 0.2 10*3/uL (ref 0.0–0.7)
Eosinophils Relative: 5 % (ref 0–5)
HCT: 38.4 % (ref 36.0–46.0)
Hemoglobin: 12.8 g/dL (ref 12.0–15.0)
LYMPHS PCT: 43 % (ref 12–46)
Lymphs Abs: 1.9 10*3/uL (ref 0.7–4.0)
MCH: 28.5 pg (ref 26.0–34.0)
MCHC: 33.3 g/dL (ref 30.0–36.0)
MCV: 85.5 fL (ref 78.0–100.0)
MONO ABS: 0.3 10*3/uL (ref 0.1–1.0)
Monocytes Relative: 8 % (ref 3–12)
NEUTROS PCT: 43 % (ref 43–77)
Neutro Abs: 1.9 10*3/uL (ref 1.7–7.7)
PLATELETS: 99 10*3/uL — AB (ref 150–400)
RBC: 4.49 MIL/uL (ref 3.87–5.11)
RDW: 15.2 % (ref 11.5–15.5)
WBC: 4.3 10*3/uL (ref 4.0–10.5)

## 2014-05-07 LAB — URINALYSIS, ROUTINE W REFLEX MICROSCOPIC
Glucose, UA: NEGATIVE mg/dL
KETONES UR: NEGATIVE mg/dL
LEUKOCYTES UA: NEGATIVE
NITRITE: NEGATIVE
PROTEIN: NEGATIVE mg/dL
Specific Gravity, Urine: 1.026 (ref 1.005–1.030)
Urobilinogen, UA: 1 mg/dL (ref 0.0–1.0)
pH: 5 (ref 5.0–8.0)

## 2014-05-07 LAB — BASIC METABOLIC PANEL
BUN: 20 mg/dL (ref 6–23)
CALCIUM: 9.1 mg/dL (ref 8.4–10.5)
CO2: 27 mEq/L (ref 19–32)
Chloride: 100 mEq/L (ref 96–112)
Creatinine, Ser: 1.2 mg/dL — ABNORMAL HIGH (ref 0.50–1.10)
GFR, EST AFRICAN AMERICAN: 64 mL/min — AB (ref >90)
GFR, EST NON AFRICAN AMERICAN: 55 mL/min — AB (ref >90)
GLUCOSE: 142 mg/dL — AB (ref 70–99)
Potassium: 4.3 mEq/L (ref 3.7–5.3)
SODIUM: 143 meq/L (ref 137–147)

## 2014-05-07 LAB — PREGNANCY, URINE: PREG TEST UR: NEGATIVE

## 2014-05-07 LAB — URINE MICROSCOPIC-ADD ON

## 2014-05-07 MED ORDER — SODIUM CHLORIDE 0.9 % IV BOLUS (SEPSIS)
1000.0000 mL | Freq: Once | INTRAVENOUS | Status: AC
  Administered 2014-05-07: 1000 mL via INTRAVENOUS

## 2014-05-07 MED ORDER — HYDROMORPHONE HCL PF 1 MG/ML IJ SOLN
1.0000 mg | Freq: Once | INTRAMUSCULAR | Status: AC
Start: 2014-05-07 — End: 2014-05-07
  Administered 2014-05-07: 1 mg via INTRAVENOUS
  Filled 2014-05-07: qty 1

## 2014-05-07 MED ORDER — PROMETHAZINE HCL 25 MG PO TABS: 25.0000 mg | ORAL_TABLET | Freq: Four times a day (QID) | ORAL | Status: DC | PRN

## 2014-05-07 MED ORDER — TAMSULOSIN HCL 0.4 MG PO CAPS: 0.4000 mg | ORAL_CAPSULE | Freq: Every day | ORAL | Status: DC

## 2014-05-07 MED ORDER — KETOROLAC TROMETHAMINE 30 MG/ML IJ SOLN
15.0000 mg | Freq: Once | INTRAMUSCULAR | Status: AC
  Administered 2014-05-07: 15 mg via INTRAVENOUS
  Filled 2014-05-07: qty 1

## 2014-05-07 MED ORDER — OXYCODONE-ACETAMINOPHEN 5-325 MG PO TABS: 1.0000 | ORAL_TABLET | Freq: Four times a day (QID) | ORAL | Status: DC | PRN

## 2014-05-07 MED ORDER — HYDROMORPHONE HCL PF 1 MG/ML IJ SOLN
1.0000 mg | Freq: Once | INTRAMUSCULAR | Status: AC
  Administered 2014-05-07: 1 mg via INTRAVENOUS
  Filled 2014-05-07: qty 1

## 2014-05-07 MED ORDER — PROMETHAZINE HCL 25 MG/ML IJ SOLN
25.0000 mg | Freq: Once | INTRAMUSCULAR | Status: AC
  Administered 2014-05-07: 25 mg via INTRAVENOUS
  Filled 2014-05-07: qty 1

## 2014-05-07 NOTE — ED Provider Notes (Addendum)
CSN: [id number]     Arrival date & time 05/07/14  0709 History   First MD Initiated Contact with Patient 05/07/14 [phone]     Chief Complaint  Patient presents with  . Nephrolithiasis     (Consider location/radiation/quality/duration/timing/severity/associated sxs/prior Treatment) HPI Comments: Hx of kidney stones with most recent stone about 1 month ago.  Patient is a 43 y.o. female presenting with flank pain. The history is provided by the patient.  Flank Pain This is a recurrent problem. The current episode started yesterday. The problem occurs constantly. The problem has been rapidly worsening. Associated symptoms comments: Right flank pain that radiates to the right lower quadrant and urinary frequency and urgency. Nothing aggravates the symptoms. Nothing relieves the symptoms. She has tried nothing for the symptoms. The treatment provided no relief.    Past Medical History  Diagnosis Date  . Hyperlipidemia   . Diabetes mellitus   . Kidney stones   . Recurrent UTI   . Non-alcoholic fatty liver disease   . Weight decrease   . Chills   . Fatigue     loss of sleep  . Dizziness   . Generalized headaches   . Wears glasses   . Blood in urine   . Liver disease   . Poor appetite   . Abdominal pain   . Blurred vision     when feeling very fatigued   . Ovarian cyst   . Thrombophlebitis    Past Surgical History  Procedure Laterality Date  . Cholecystectomy  2005  . Kidney stones    . Gastric bypass  10/2010  . Tubal ligation      x3  . Abdominal hysterectomy  2008    for polycystic ovaries  . Cystostomy w/ bladder biopsy    . Exploratory laparotomy  09/01/11  . Laparoscopy  10/19/2011    Procedure: LAPAROSCOPY OPERATIVE;  Surgeon: Miguel Aschoff;  Location: [facility];  Service: Gynecology;  Laterality: N/A;  Operative Laparoscopy with Lysis Of Adhesions  . Laparotomy  10/19/2011    Procedure: LAPAROTOMY;  Surgeon: Miguel Aschoff;  Location: [facility];  Service: Gynecology;  Laterality:  Bilateral;  Laparotomy With Bilateral Oophorectomy  . Bilateral salpingoophorectomy  2012    Dr Tenny Craw  . Colonoscopy      negative, done for pain  . Upper gastrointestinal endoscopy      to assess varices   Family History  Problem Relation Age of Onset  . Lung cancer Mother   . Cancer Mother     lung  . Hyperlipidemia Father   . Heart disease Father   . Stroke Father   . Hypertension Father   . Diabetes Father   . Hypertension Brother   . Other Brother     suicide   History  Substance Use Topics  . Smoking status: Never Smoker   . Smokeless tobacco: Never Used  . Alcohol Use: No     Comment: never   OB History   Grav Para Term Preterm Abortions TAB SAB Ect Mult Living                 Review of Systems  Gastrointestinal: Positive for nausea and vomiting.  Genitourinary: Positive for frequency, hematuria and flank pain. Negative for dysuria.      Allergies  Review of patient's allergies indicates no known allergies.  Home Medications   Prior to Admission medications   Medication Sig Start Date End Date Taking? Authorizing Provider  allopurinol (ZYLOPRIM) 100 MG tablet  Take 100 mg by mouth daily.    Historical Provider, MD  ALPRAZolam Prudy Feeler(XANAX) 0.5 MG tablet TAKE 1 TABLET BY MOUTH 3 TIMES A DAY 08/23/13   Sheliah HatchKatherine E Tabori, MD  buPROPion (WELLBUTRIN XL) 150 MG 24 hr tablet Take 1 tablet (150 mg total) by mouth daily. 06/06/13   Sheliah HatchKatherine E Tabori, MD  estradiol (ESTRACE) 1 MG tablet Take 1 mg by mouth daily.    Historical Provider, MD  hydrochlorothiazide (HYDRODIURIL) 25 MG tablet Take 12.5 mg by mouth daily.    Historical Provider, MD  HYDROmorphone (DILAUDID) 2 MG tablet Take 1 tablet (2 mg total) by mouth every 4 (four) hours as needed for pain. 07/06/13   Dorathy KinsmanVirginia Smith, CNM  metoCLOPramide (REGLAN) 10 MG tablet Take 5 mg by mouth 4 (four) times daily as needed (nausea and vomiting).  06/30/13 07/10/13  Historical Provider, MD  metoCLOPramide (REGLAN) 10 MG tablet  Take 1 tablet (10 mg total) by mouth 3 (three) times daily with meals. 07/06/13 07/06/14  Dorathy KinsmanVirginia Smith, CNM  nortriptyline (PAMELOR) 25 MG capsule Take 1 capsule (25 mg total) by mouth at bedtime. 08/24/13   Adam Gus Rankinobert Jaffe, DO  pantoprazole (PROTONIX) 40 MG tablet Take 40 mg by mouth 2 (two) times daily.  06/24/13 06/24/14  Historical Provider, MD  rosuvastatin (CRESTOR) 20 MG tablet Take 1 tablet (20 mg total) by mouth daily. 10/11/12 10/11/13  Sheliah HatchKatherine E Tabori, MD  topiramate (TOPAMAX) 25 MG tablet Take 25 mg by mouth 2 (two) times daily.  06/24/13 06/24/14  Historical Provider, MD  zolpidem (AMBIEN CR) 12.5 MG CR tablet Take 1 tablet (12.5 mg total) by mouth at bedtime as needed for sleep. 09/04/13   Sheliah HatchKatherine E Tabori, MD   BP 123/76  Pulse 83  Temp(Src) 97.9 F (36.6 C) (Oral)  Resp 18  SpO2 99% Physical Exam  Nursing note and vitals reviewed. Constitutional: She is oriented to person, place, and time. She appears well-developed and well-nourished. She appears distressed.  Appears uncomfortable  HENT:  Head: Normocephalic and atraumatic.  Mouth/Throat: Oropharynx is clear and moist.  Eyes: Conjunctivae and EOM are normal. Pupils are equal, round, and reactive to light.  Neck: Normal range of motion. Neck supple.  Cardiovascular: Normal rate, regular rhythm and intact distal pulses.   No murmur heard. Pulmonary/Chest: Effort normal and breath sounds normal. No respiratory distress. She has no wheezes. She has no rales.  Abdominal: Soft. She exhibits no distension. There is no tenderness. There is CVA tenderness. There is no rebound and no guarding.  Right CVA tenderness  Musculoskeletal: Normal range of motion. She exhibits no edema and no tenderness.  Neurological: She is alert and oriented to person, place, and time.  Skin: Skin is warm and dry. No rash noted. No erythema.  Psychiatric: She has a normal mood and affect. Her behavior is normal.    ED Course  Procedures (including  critical care time) Labs Review Labs Reviewed  URINALYSIS, ROUTINE W REFLEX MICROSCOPIC - Abnormal; Notable for the following:    Color, Urine AMBER (*)    APPearance CLOUDY (*)    Hgb urine dipstick LARGE (*)    Bilirubin Urine SMALL (*)    All other components within normal limits  CBC WITH DIFFERENTIAL - Abnormal; Notable for the following:    Platelets 99 (*)    All other components within normal limits  BASIC METABOLIC PANEL - Abnormal; Notable for the following:    Glucose, Bld 142 (*)    Creatinine,  Ser 1.20 (*)    GFR calc non Af Amer 55 (*)    GFR calc Af Amer 64 (*)    All other components within normal limits  URINE MICROSCOPIC-ADD ON - Abnormal; Notable for the following:    Squamous Epithelial / LPF FEW (*)    Bacteria, UA MANY (*)    All other components within normal limits  PREGNANCY, URINE    Imaging Review No results found.   EKG Interpretation None      MDM   Final diagnoses:  Kidney stone    Pt with symptoms consistent with kidney stone with hx of same.  Last KS was 1 month ago.  No recent need for retrieval or intervention and stones have passed on their own.  States she had a CT about 5 months ago and told she had passible stones on the right. Denies infectious sx, or GI symptoms.  Low concern for diverticulitis and no risk factors or history suggestive of AAA.  No hx suggestive of GU source (discharge) and otherwise pt is healthy.  Will hydrate, treat pain and ensure no infection with UA, CBC, CMP.  No imaging needed at this time unless labs are abnormal.  9:25 AM Pt with UA consistent with stone without infection.  CBC wnl and BMP with mild elevation of Cr but o/w stable.  Pt feeling better after meds and tolerating po's.  D/ced home with urology f/u.  10:45 AM Pt is feeling better and requesting to go home. Gwyneth SproutWhitney Lajuan Godbee, MD 05/07/14 [id number]  Gwyneth SproutWhitney Allyn Bertoni, MD 05/07/14 1046

## 2014-05-07 NOTE — Discharge Instructions (Signed)
Kidney Stones Kidney stones (urolithiasis) are solid masses that form inside your kidneys. The intense pain is caused by the stone moving through the kidney, ureter, bladder, and urethra (urinary tract). When the stone moves, the ureter starts to spasm around the stone. The stone is usually passed in your pee (urine).  HOME CARE  Drink enough fluids to keep your pee clear or pale yellow. This helps to get the stone out.  Strain all pee through the provided strainer. Do not pee without peeing through the strainer, not even once. If you pee the stone out, catch it in the strainer. The stone may be as small as a grain of salt. Take this to your doctor. This will help your doctor figure out what you can do to try to prevent more kidney stones.  Only take medicine as told by your doctor.  Follow up with your doctor as told.  Get follow-up X-rays as told by your doctor. GET HELP IF: You have pain that gets worse even if you have been taking pain medicine. GET HELP RIGHT AWAY IF:   Your pain does not get better with medicine.  You have a fever or shaking chills.  Your pain increases and gets worse over 18 hours.  You have new belly (abdominal) pain.  You feel faint or pass out.  You are unable to pee. MAKE SURE YOU:   Understand these instructions.  Will watch your condition.  Will get help right away if you are not doing well or get worse. Document Released: 06/01/2008 Document Revised: 08/16/2013 Document Reviewed: 05/17/2013 Exit[employer] Patient Information 2014 [employer].  

## 2014-05-07 NOTE — ED Notes (Addendum)
Pt reports kidney stones-hx of same.  Passed some fragments yesterday. Vomited x 3 this morning.  (R) sided flank pain

## 2014-09-08 ENCOUNTER — Emergency Department (HOSPITAL_BASED_OUTPATIENT_CLINIC_OR_DEPARTMENT_OTHER): Payer: Managed Care, Other (non HMO)

## 2014-09-08 ENCOUNTER — Emergency Department (HOSPITAL_BASED_OUTPATIENT_CLINIC_OR_DEPARTMENT_OTHER)
Admission: EM | Admit: 2014-09-08 | Discharge: 2014-09-08 | Disposition: A | Discharge status: Home / Self Care | Payer: Managed Care, Other (non HMO) | Attending: Emergency Medicine | Admitting: Emergency Medicine

## 2014-09-08 ENCOUNTER — Encounter (HOSPITAL_BASED_OUTPATIENT_CLINIC_OR_DEPARTMENT_OTHER): Payer: Self-pay | Admitting: Emergency Medicine

## 2014-09-08 DIAGNOSIS — Z79899 Other long term (current) drug therapy: Secondary | ICD-10-CM | POA: Diagnosis not present

## 2014-09-08 DIAGNOSIS — R109 Unspecified abdominal pain: Secondary | ICD-10-CM | POA: Diagnosis present

## 2014-09-08 DIAGNOSIS — Z87442 Personal history of urinary calculi: Secondary | ICD-10-CM | POA: Diagnosis not present

## 2014-09-08 DIAGNOSIS — H538 Other visual disturbances: Secondary | ICD-10-CM | POA: Diagnosis not present

## 2014-09-08 DIAGNOSIS — R1033 Periumbilical pain: Secondary | ICD-10-CM | POA: Diagnosis not present

## 2014-09-08 DIAGNOSIS — Z8719 Personal history of other diseases of the digestive system: Secondary | ICD-10-CM | POA: Diagnosis not present

## 2014-09-08 DIAGNOSIS — R110 Nausea: Secondary | ICD-10-CM | POA: Insufficient documentation

## 2014-09-08 DIAGNOSIS — Z87440 Personal history of urinary (tract) infections: Secondary | ICD-10-CM | POA: Insufficient documentation

## 2014-09-08 DIAGNOSIS — Z87448 Personal history of other diseases of urinary system: Secondary | ICD-10-CM | POA: Insufficient documentation

## 2014-09-08 DIAGNOSIS — E785 Hyperlipidemia, unspecified: Secondary | ICD-10-CM | POA: Diagnosis not present

## 2014-09-08 DIAGNOSIS — E119 Type 2 diabetes mellitus without complications: Secondary | ICD-10-CM | POA: Insufficient documentation

## 2014-09-08 DIAGNOSIS — Z9089 Acquired absence of other organs: Secondary | ICD-10-CM | POA: Diagnosis not present

## 2014-09-08 LAB — CBC WITH DIFFERENTIAL/PLATELET
BASOS ABS: 0 10*3/uL (ref 0.0–0.1)
BASOS PCT: 1 % (ref 0–1)
EOS ABS: 0.1 10*3/uL (ref 0.0–0.7)
Eosinophils Relative: 3 % (ref 0–5)
HCT: 34.3 % — ABNORMAL LOW (ref 36.0–46.0)
HEMOGLOBIN: 11.6 g/dL — AB (ref 12.0–15.0)
LYMPHS PCT: 36 % (ref 12–46)
Lymphs Abs: 1.6 10*3/uL (ref 0.7–4.0)
MCH: 28.6 pg (ref 26.0–34.0)
MCHC: 33.8 g/dL (ref 30.0–36.0)
MCV: 84.7 fL (ref 78.0–100.0)
MONO ABS: 0.3 10*3/uL (ref 0.1–1.0)
Monocytes Relative: 6 % (ref 3–12)
Neutro Abs: 2.5 10*3/uL (ref 1.7–7.7)
Neutrophils Relative %: 54 % (ref 43–77)
Platelets: 150 10*3/uL (ref 150–400)
RBC: 4.05 MIL/uL (ref 3.87–5.11)
RDW: 12.5 % (ref 11.5–15.5)
WBC: 4.5 10*3/uL (ref 4.0–10.5)

## 2014-09-08 LAB — URINALYSIS, ROUTINE W REFLEX MICROSCOPIC
Glucose, UA: 100 mg/dL — AB
KETONES UR: 15 mg/dL — AB
NITRITE: NEGATIVE
PROTEIN: 30 mg/dL — AB
Specific Gravity, Urine: 1.021 (ref 1.005–1.030)
Urobilinogen, UA: 1 mg/dL (ref 0.0–1.0)
pH: 6 (ref 5.0–8.0)

## 2014-09-08 LAB — COMPREHENSIVE METABOLIC PANEL
ALT: 13 U/L (ref 0–35)
AST: 23 U/L (ref 0–37)
Albumin: 3.4 g/dL — ABNORMAL LOW (ref 3.5–5.2)
Alkaline Phosphatase: 93 U/L (ref 39–117)
Anion gap: 13 (ref 5–15)
BUN: 9 mg/dL (ref 6–23)
CALCIUM: 9.2 mg/dL (ref 8.4–10.5)
CO2: 27 meq/L (ref 19–32)
CREATININE: 1 mg/dL (ref 0.50–1.10)
Chloride: 103 mEq/L (ref 96–112)
GFR, EST AFRICAN AMERICAN: 79 mL/min — AB (ref >90)
GFR, EST NON AFRICAN AMERICAN: 68 mL/min — AB (ref >90)
GLUCOSE: 200 mg/dL — AB (ref 70–99)
Potassium: 3.5 mEq/L — ABNORMAL LOW (ref 3.7–5.3)
Sodium: 143 mEq/L (ref 137–147)
TOTAL PROTEIN: 6.5 g/dL (ref 6.0–8.3)
Total Bilirubin: 0.3 mg/dL (ref 0.3–1.2)

## 2014-09-08 LAB — URINE MICROSCOPIC-ADD ON

## 2014-09-08 LAB — LIPASE, BLOOD: LIPASE: 39 U/L (ref 11–59)

## 2014-09-08 MED ORDER — ONDANSETRON HCL 4 MG/2ML IJ SOLN
4.0000 mg | Freq: Once | INTRAMUSCULAR | Status: AC
  Administered 2014-09-08: 4 mg via INTRAVENOUS
  Filled 2014-09-08: qty 2

## 2014-09-08 MED ORDER — KETOROLAC TROMETHAMINE 30 MG/ML IJ SOLN
30.0000 mg | Freq: Once | INTRAMUSCULAR | Status: AC
  Administered 2014-09-08: 30 mg via INTRAVENOUS
  Filled 2014-09-08: qty 1

## 2014-09-08 MED ORDER — OMEPRAZOLE 20 MG PO CPDR: 20.0000 mg | DELAYED_RELEASE_CAPSULE | Freq: Every day | ORAL | Status: DC

## 2014-09-08 MED ORDER — MORPHINE SULFATE 2 MG/ML IJ SOLN
2.0000 mg | Freq: Once | INTRAMUSCULAR | Status: AC
  Administered 2014-09-08: 2 mg via INTRAVENOUS
  Filled 2014-09-08: qty 1

## 2014-09-08 MED ORDER — PROMETHAZINE HCL 12.5 MG RE SUPP: 12.5000 mg | Freq: Three times a day (TID) | RECTAL | Status: DC | PRN

## 2014-09-08 MED ORDER — DICYCLOMINE HCL 10 MG/ML IM SOLN
20.0000 mg | Freq: Once | INTRAMUSCULAR | Status: AC
  Administered 2014-09-08: 20 mg via INTRAMUSCULAR
  Filled 2014-09-08: qty 2

## 2014-09-08 NOTE — ED Notes (Signed)
Patient transported to CT 

## 2014-09-08 NOTE — ED Notes (Signed)
Pt reports that she feel like her abdomen in swollen, was seen at [facility] earlier in the week and told that "her liver is enlarged and pressing against her pancreas"

## 2014-09-08 NOTE — ED Provider Notes (Addendum)
CSN: [id number]     Arrival date & time 09/08/14  0258 History   First MD Initiated Contact with Patient 09/08/14 [phone]     Chief Complaint  Patient presents with  . Abdominal Pain     (Consider location/radiation/quality/duration/timing/severity/associated sxs/prior Treatment) Patient is a 43 y.o. female presenting with abdominal pain. The history is provided by the patient.  Abdominal Pain Pain location:  Periumbilical Pain quality: bloating   Pain radiates to:  Does not radiate Pain severity:  Severe Onset quality:  Gradual Timing:  Constant Progression:  Unchanged Chronicity:  New Context: not alcohol use   Relieved by:  Nothing Worsened by:  Nothing tried Ineffective treatments: oxyIR. Associated symptoms: nausea   Associated symptoms: no anorexia and no shortness of breath   Risk factors: not pregnant   Seen at [facility] on the 8th and told her liver was enlarged and she reports being placed on transplant list.  States meds are not working for her  Past Medical History  Diagnosis Date  . Hyperlipidemia   . Diabetes mellitus   . Kidney stones   . Recurrent UTI   . Non-alcoholic fatty liver disease   . Weight decrease   . Chills   . Fatigue     loss of sleep  . Dizziness   . Generalized headaches   . Wears glasses   . Blood in urine   . Liver disease   . Poor appetite   . Abdominal pain   . Blurred vision     when feeling very fatigued   . Ovarian cyst   . Thrombophlebitis    Past Surgical History  Procedure Laterality Date  . Cholecystectomy  2005  . Kidney stones    . Gastric bypass  10/2010  . Tubal ligation      x3  . Abdominal hysterectomy  2008    for polycystic ovaries  . Cystostomy w/ bladder biopsy    . Exploratory laparotomy  09/01/11  . Laparoscopy  10/19/2011    Procedure: LAPAROSCOPY OPERATIVE;  Surgeon: Miguel Aschoff;  Location: [facility];  Service: Gynecology;  Laterality: N/A;  Operative Laparoscopy with Lysis Of Adhesions  . Laparotomy   10/19/2011    Procedure: LAPAROTOMY;  Surgeon: Miguel Aschoff;  Location: [facility];  Service: Gynecology;  Laterality: Bilateral;  Laparotomy With Bilateral Oophorectomy  . Bilateral salpingoophorectomy  2012    Dr Tenny Craw  . Colonoscopy      negative, done for pain  . Upper gastrointestinal endoscopy      to assess varices   Family History  Problem Relation Age of Onset  . Lung cancer Mother   . Cancer Mother     lung  . Hyperlipidemia Father   . Heart disease Father   . Stroke Father   . Hypertension Father   . Diabetes Father   . Hypertension Brother   . Other Brother     suicide   History  Substance Use Topics  . Smoking status: Never Smoker   . Smokeless tobacco: Never Used  . Alcohol Use: No     Comment: never   OB History   Grav Para Term Preterm Abortions TAB SAB Ect Mult Living                 Review of Systems  Respiratory: Negative for shortness of breath.   Gastrointestinal: Positive for nausea and abdominal pain. Negative for anorexia.  All other systems reviewed and are negative.     Allergies  Review of patient's allergies indicates no known allergies.  Home Medications   Prior to Admission medications   Medication Sig Start Date End Date Taking? Authorizing Provider  allopurinol (ZYLOPRIM) 100 MG tablet Take 100 mg by mouth daily.   Yes Historical Provider, MD  ALPRAZolam Prudy Feeler) 0.5 MG tablet TAKE 1 TABLET BY MOUTH 3 TIMES A DAY 08/23/13  Yes Sheliah Hatch, MD  estradiol (ESTRACE) 1 MG tablet Take 1 mg by mouth daily.   Yes Historical Provider, MD  oxyCODONE-acetaminophen (PERCOCET/ROXICET) 5-325 MG per tablet Take 1-2 tablets by mouth every 6 (six) hours as needed for severe pain. 05/07/14  Yes Gwyneth Sprout, MD  promethazine (PHENERGAN) 25 MG tablet Take 1 tablet (25 mg total) by mouth every 6 (six) hours as needed for nausea or vomiting. 05/07/14  Yes Gwyneth Sprout, MD  tamsulosin (FLOMAX) 0.4 MG CAPS capsule Take 1 capsule (0.4 mg total) by  mouth daily after supper. 05/07/14  Yes Gwyneth Sprout, MD  buPROPion (WELLBUTRIN XL) 150 MG 24 hr tablet Take 1 tablet (150 mg total) by mouth daily. 06/06/13   Sheliah Hatch, MD  hydrochlorothiazide (HYDRODIURIL) 25 MG tablet Take 12.5 mg by mouth daily.    Historical Provider, MD  HYDROmorphone (DILAUDID) 2 MG tablet Take 1 tablet (2 mg total) by mouth every 4 (four) hours as needed for pain. 07/06/13   Dorathy Kinsman, CNM  metoCLOPramide (REGLAN) 10 MG tablet Take 5 mg by mouth 4 (four) times daily as needed (nausea and vomiting).  06/30/13 07/10/13  Historical Provider, MD  metoCLOPramide (REGLAN) 10 MG tablet Take 1 tablet (10 mg total) by mouth 3 (three) times daily with meals. 07/06/13 07/06/14  Dorathy Kinsman, CNM  nortriptyline (PAMELOR) 25 MG capsule Take 1 capsule (25 mg total) by mouth at bedtime. 08/24/13   Adam Gus Rankin, DO  pantoprazole (PROTONIX) 40 MG tablet Take 40 mg by mouth 2 (two) times daily.  06/24/13 06/24/14  Historical Provider, MD  rosuvastatin (CRESTOR) 20 MG tablet Take 1 tablet (20 mg total) by mouth daily. 10/11/12 10/11/13  Sheliah Hatch, MD  topiramate (TOPAMAX) 25 MG tablet Take 25 mg by mouth 2 (two) times daily.  06/24/13 06/24/14  Historical Provider, MD  zolpidem (AMBIEN CR) 12.5 MG CR tablet Take 1 tablet (12.5 mg total) by mouth at bedtime as needed for sleep. 09/04/13   Sheliah Hatch, MD   BP 131/78  Pulse 101  Temp(Src) 98.4 F (36.9 C) (Oral)  Resp 18  Ht  (1.6 m)  Wt 230 lb (104.327 kg)  BMI 40.75 kg/m2  SpO2 94% Physical Exam  Constitutional: She is oriented to person, place, and time. She appears well-developed and well-nourished. No distress.  Sleeping upon entrance into the room  HENT:  Head: Normocephalic and atraumatic.  Mouth/Throat: Oropharynx is clear and moist.  Eyes: Conjunctivae and EOM are normal.  Neck: Normal range of motion. Neck supple.  Cardiovascular: Normal rate, regular rhythm and intact distal pulses.    Pulmonary/Chest: Effort normal and breath sounds normal. She has no wheezes. She has no rales.  Abdominal: Soft. Bowel sounds are increased. There is no tenderness. There is no rebound and no guarding.  Musculoskeletal: Normal range of motion.  Neurological: She is alert and oriented to person, place, and time.  Skin: Skin is warm and dry.  Psychiatric: She has a normal mood and affect.    ED Course  Procedures (including critical care time) Labs Review Labs Reviewed  CBC WITH DIFFERENTIAL  COMPREHENSIVE METABOLIC PANEL  LIPASE, BLOOD  URINALYSIS, ROUTINE W REFLEX MICROSCOPIC    Imaging Review No results found.   EKG Interpretation None      MDM   Final diagnoses:  None  records from [facility] reviewed there does not appear to be a CT from 9/8 but they have provided one from August.  Labs essentially normal excepting low platelets.  Patient has active RX for both zofran and Oxy IR  Somnolent on exam.  Sugar will need recheck in 2 days by your doctor.  No acute findings on CT either here or at [facility].  Follow up with your doctor for ongoing care will add phenergan and prilosec.     At discharge, patient concerned she has conflicting information regarding needing a liver transplant.  EDP stated her family doctor may have additional labs or imaging of the liver that merit contacting a transplant center but patient's LFTs are normal and there is no huge enlargement on CT.  Patient does not have jaundice Lipase is normal and no signs of pancreatitis on CT.  Recommend follow up with your PMD and GI as directed by your PMD.    Cherina Dhillon K Elnor Renovato-Rasch, MD 09/08/14 0449  Makeila Yamaguchi K Belkys Henault-Rasch, MD 09/08/14 [phone]

## 2015-01-25 DIAGNOSIS — F419 Anxiety disorder, unspecified: Secondary | ICD-10-CM

## 2015-01-25 HISTORY — DX: Anxiety disorder, unspecified: F41.9

## 2015-01-27 ENCOUNTER — Encounter (HOSPITAL_BASED_OUTPATIENT_CLINIC_OR_DEPARTMENT_OTHER): Payer: Self-pay | Admitting: *Deleted

## 2015-01-27 ENCOUNTER — Emergency Department (HOSPITAL_BASED_OUTPATIENT_CLINIC_OR_DEPARTMENT_OTHER)
Admission: EM | Admit: 2015-01-27 | Discharge: 2015-01-27 | Disposition: A | Discharge status: Home / Self Care | Payer: Managed Care, Other (non HMO) | Attending: Emergency Medicine | Admitting: Emergency Medicine

## 2015-01-27 DIAGNOSIS — Z90710 Acquired absence of both cervix and uterus: Secondary | ICD-10-CM | POA: Insufficient documentation

## 2015-01-27 DIAGNOSIS — Z8719 Personal history of other diseases of the digestive system: Secondary | ICD-10-CM | POA: Insufficient documentation

## 2015-01-27 DIAGNOSIS — Z8701 Personal history of pneumonia (recurrent): Secondary | ICD-10-CM | POA: Diagnosis not present

## 2015-01-27 DIAGNOSIS — Z79899 Other long term (current) drug therapy: Secondary | ICD-10-CM | POA: Insufficient documentation

## 2015-01-27 DIAGNOSIS — R197 Diarrhea, unspecified: Secondary | ICD-10-CM | POA: Diagnosis not present

## 2015-01-27 DIAGNOSIS — Z8679 Personal history of other diseases of the circulatory system: Secondary | ICD-10-CM | POA: Diagnosis not present

## 2015-01-27 DIAGNOSIS — E785 Hyperlipidemia, unspecified: Secondary | ICD-10-CM | POA: Insufficient documentation

## 2015-01-27 DIAGNOSIS — Z87442 Personal history of urinary calculi: Secondary | ICD-10-CM | POA: Diagnosis not present

## 2015-01-27 DIAGNOSIS — E119 Type 2 diabetes mellitus without complications: Secondary | ICD-10-CM | POA: Insufficient documentation

## 2015-01-27 DIAGNOSIS — Z9049 Acquired absence of other specified parts of digestive tract: Secondary | ICD-10-CM | POA: Insufficient documentation

## 2015-01-27 DIAGNOSIS — Z8669 Personal history of other diseases of the nervous system and sense organs: Secondary | ICD-10-CM | POA: Insufficient documentation

## 2015-01-27 DIAGNOSIS — Z9851 Tubal ligation status: Secondary | ICD-10-CM | POA: Diagnosis not present

## 2015-01-27 DIAGNOSIS — K219 Gastro-esophageal reflux disease without esophagitis: Secondary | ICD-10-CM | POA: Insufficient documentation

## 2015-01-27 DIAGNOSIS — Z8742 Personal history of other diseases of the female genital tract: Secondary | ICD-10-CM | POA: Insufficient documentation

## 2015-01-27 DIAGNOSIS — R1084 Generalized abdominal pain: Secondary | ICD-10-CM | POA: Diagnosis present

## 2015-01-27 HISTORY — DX: Gastro-esophageal reflux disease without esophagitis: K21.9

## 2015-01-27 LAB — URINALYSIS, ROUTINE W REFLEX MICROSCOPIC
Bilirubin Urine: NEGATIVE
Glucose, UA: 100 mg/dL — AB
Ketones, ur: NEGATIVE mg/dL
Leukocytes, UA: NEGATIVE
Nitrite: NEGATIVE
PH: 6 (ref 5.0–8.0)
Protein, ur: NEGATIVE mg/dL
SPECIFIC GRAVITY, URINE: 1.02 (ref 1.005–1.030)
Urobilinogen, UA: 0.2 mg/dL (ref 0.0–1.0)

## 2015-01-27 LAB — COMPREHENSIVE METABOLIC PANEL
ALT: 17 U/L (ref 0–35)
AST: 33 U/L (ref 0–37)
Albumin: 4.5 g/dL (ref 3.5–5.2)
Alkaline Phosphatase: 81 U/L (ref 39–117)
Anion gap: 6 (ref 5–15)
BILIRUBIN TOTAL: 0.8 mg/dL (ref 0.3–1.2)
BUN: 13 mg/dL (ref 6–23)
CO2: 25 mmol/L (ref 19–32)
CREATININE: 0.95 mg/dL (ref 0.50–1.10)
Calcium: 8.9 mg/dL (ref 8.4–10.5)
Chloride: 106 mmol/L (ref 96–112)
GFR calc non Af Amer: 73 mL/min — ABNORMAL LOW (ref >90)
GFR, EST AFRICAN AMERICAN: 84 mL/min — AB (ref >90)
Glucose, Bld: 175 mg/dL — ABNORMAL HIGH (ref 70–99)
Potassium: 3.6 mmol/L (ref 3.5–5.1)
Sodium: 137 mmol/L (ref 135–145)
TOTAL PROTEIN: 7.3 g/dL (ref 6.0–8.3)

## 2015-01-27 LAB — URINE MICROSCOPIC-ADD ON

## 2015-01-27 LAB — CBC
HEMATOCRIT: 42.7 % (ref 36.0–46.0)
HEMOGLOBIN: 14 g/dL (ref 12.0–15.0)
MCH: 26.7 pg (ref 26.0–34.0)
MCHC: 32.8 g/dL (ref 30.0–36.0)
MCV: 81.3 fL (ref 78.0–100.0)
Platelets: 154 10*3/uL (ref 150–400)
RBC: 5.25 MIL/uL — ABNORMAL HIGH (ref 3.87–5.11)
RDW: 13.3 % (ref 11.5–15.5)
WBC: 7.6 10*3/uL (ref 4.0–10.5)

## 2015-01-27 LAB — I-STAT CG4 LACTIC ACID, ED: Lactic Acid, Venous: 1.19 mmol/L (ref 0.5–2.0)

## 2015-01-27 LAB — LIPASE, BLOOD: Lipase: 65 U/L — ABNORMAL HIGH (ref 11–59)

## 2015-01-27 MED ORDER — SODIUM CHLORIDE 0.9 % IV BOLUS (SEPSIS)
1000.0000 mL | Freq: Once | INTRAVENOUS | Status: AC
  Administered 2015-01-27: 1000 mL via INTRAVENOUS

## 2015-01-27 MED ORDER — LOPERAMIDE HCL 2 MG PO CAPS: 2.0000 mg | ORAL_CAPSULE | Freq: Four times a day (QID) | ORAL | Status: DC | PRN

## 2015-01-27 MED ORDER — ONDANSETRON 4 MG PO TBDP
4.0000 mg | ORAL_TABLET | Freq: Once | ORAL | Status: AC
  Administered 2015-01-27: 4 mg via ORAL
  Filled 2015-01-27: qty 1

## 2015-01-27 NOTE — ED Provider Notes (Signed)
CSN: [id number]     Arrival date & time 01/27/15  1117 History   First MD Initiated Contact with Patient 01/27/15 1158     Chief Complaint  Patient presents with  . Abdominal Pain     (Consider location/radiation/quality/duration/timing/severity/associated sxs/prior Treatment) Patient is a 44 y.o. female presenting with abdominal pain. The history is provided by the patient.  Abdominal Pain Pain location:  Generalized Pain quality: cramping   Pain radiates to:  Does not radiate Pain severity:  Moderate Onset quality:  Gradual Timing:  Constant Chronicity:  New Context: not previous surgeries, not recent travel, not sick contacts and not suspicious food intake   Relieved by:  Nothing Worsened by:  Nothing tried Associated symptoms: diarrhea   Associated symptoms: no cough, no fever and no shortness of breath   Diarrhea:    Quality:  Semi-solid and watery   Timing:  Constant   Progression:  Unchanged   Past Medical History  Diagnosis Date  . Hyperlipidemia   . Diabetes mellitus   . Kidney stones   . Recurrent UTI   . Non-alcoholic fatty liver disease   . Weight decrease   . Chills   . Fatigue     loss of sleep  . Dizziness   . Generalized headaches   . Wears glasses   . Blood in urine   . Liver disease   . Poor appetite   . Abdominal pain   . Blurred vision     when feeling very fatigued   . Ovarian cyst   . Thrombophlebitis    Past Surgical History  Procedure Laterality Date  . Cholecystectomy  2005  . Kidney stones    . Gastric bypass  10/2010  . Tubal ligation      x3  . Abdominal hysterectomy  2008    for polycystic ovaries  . Cystostomy w/ bladder biopsy    . Exploratory laparotomy  09/01/11  . Laparoscopy  10/19/2011    Procedure: LAPAROSCOPY OPERATIVE;  Surgeon: Miguel Aschoff;  Location: [facility];  Service: Gynecology;  Laterality: N/A;  Operative Laparoscopy with Lysis Of Adhesions  . Laparotomy  10/19/2011    Procedure: LAPAROTOMY;  Surgeon: Miguel Aschoff;  Location: [facility];  Service: Gynecology;  Laterality: Bilateral;  Laparotomy With Bilateral Oophorectomy  . Bilateral salpingoophorectomy  2012    Dr Tenny Craw  . Colonoscopy      negative, done for pain  . Upper gastrointestinal endoscopy      to assess varices   Family History  Problem Relation Age of Onset  . Lung cancer Mother   . Cancer Mother     lung  . Hyperlipidemia Father   . Heart disease Father   . Stroke Father   . Hypertension Father   . Diabetes Father   . Hypertension Brother   . Other Brother     suicide   History  Substance Use Topics  . Smoking status: Never Smoker   . Smokeless tobacco: Never Used  . Alcohol Use: No     Comment: never   OB History    No data available     Review of Systems  Constitutional: Negative for fever.  Respiratory: Negative for cough and shortness of breath.   Gastrointestinal: Positive for abdominal pain and diarrhea.  All other systems reviewed and are negative.     Allergies  Review of patient's allergies indicates no known allergies.  Home Medications   Prior to Admission medications   Medication Sig  Start Date End Date Taking? Authorizing Provider  allopurinol (ZYLOPRIM) 100 MG tablet Take 100 mg by mouth daily.    Historical Provider, MD  ALPRAZolam Prudy Feeler(XANAX) 0.5 MG tablet TAKE 1 TABLET BY MOUTH 3 TIMES A DAY 08/23/13   Sheliah HatchKatherine E Tabori, MD  buPROPion (WELLBUTRIN XL) 150 MG 24 hr tablet Take 1 tablet (150 mg total) by mouth daily. 06/06/13   Sheliah HatchKatherine E Tabori, MD  estradiol (ESTRACE) 1 MG tablet Take 1 mg by mouth daily.    Historical Provider, MD  hydrochlorothiazide (HYDRODIURIL) 25 MG tablet Take 12.5 mg by mouth daily.    Historical Provider, MD  HYDROmorphone (DILAUDID) 2 MG tablet Take 1 tablet (2 mg total) by mouth every 4 (four) hours as needed for pain. 07/06/13   Dorathy KinsmanVirginia Smith, CNM  metoCLOPramide (REGLAN) 10 MG tablet Take 5 mg by mouth 4 (four) times daily as needed (nausea and vomiting).  06/30/13  07/10/13  Historical Provider, MD  metoCLOPramide (REGLAN) 10 MG tablet Take 1 tablet (10 mg total) by mouth 3 (three) times daily with meals. 07/06/13 07/06/14  Dorathy KinsmanVirginia Smith, CNM  nortriptyline (PAMELOR) 25 MG capsule Take 1 capsule (25 mg total) by mouth at bedtime. 08/24/13   Adam Gus Rankinobert Jaffe, DO  omeprazole (PRILOSEC) 20 MG capsule Take 1 capsule (20 mg total) by mouth daily. 09/08/14   April Smitty CordsK Palumbo-Rasch, MD  oxyCODONE-acetaminophen (PERCOCET/ROXICET) 5-325 MG per tablet Take 1-2 tablets by mouth every 6 (six) hours as needed for severe pain. 05/07/14   Gwyneth SproutWhitney Plunkett, MD  pantoprazole (PROTONIX) 40 MG tablet Take 40 mg by mouth 2 (two) times daily.  06/24/13 06/24/14  Historical Provider, MD  promethazine (PHENERGAN) 12.5 MG suppository Place 1 suppository (12.5 mg total) rectally every 8 (eight) hours as needed for refractory nausea / vomiting. 09/08/14   April K Palumbo-Rasch, MD  promethazine (PHENERGAN) 25 MG tablet Take 1 tablet (25 mg total) by mouth every 6 (six) hours as needed for nausea or vomiting. 05/07/14   Gwyneth SproutWhitney Plunkett, MD  rosuvastatin (CRESTOR) 20 MG tablet Take 1 tablet (20 mg total) by mouth daily. 10/11/12 10/11/13  Sheliah HatchKatherine E Tabori, MD  tamsulosin (FLOMAX) 0.4 MG CAPS capsule Take 1 capsule (0.4 mg total) by mouth daily after supper. 05/07/14   Gwyneth SproutWhitney Plunkett, MD  topiramate (TOPAMAX) 25 MG tablet Take 25 mg by mouth 2 (two) times daily.  06/24/13 06/24/14  Historical Provider, MD  zolpidem (AMBIEN CR) 12.5 MG CR tablet Take 1 tablet (12.5 mg total) by mouth at bedtime as needed for sleep. 09/04/13   Sheliah HatchKatherine E Tabori, MD   BP 116/73 mmHg  Pulse 101  Temp(Src) 98.5 F (36.9 C) (Oral)  Resp 18  Ht 5\' 4"  (1.626 m)  Wt 208 lb (94.348 kg)  BMI 35.69 kg/m2  SpO2 97% Physical Exam  Constitutional: She is oriented to person, place, and time. She appears well-developed and well-nourished. No distress.  HENT:  Head: Normocephalic and atraumatic.  Mouth/Throat: Oropharynx  is clear and moist.  Eyes: EOM are normal. Pupils are equal, round, and reactive to light.  Neck: Normal range of motion. Neck supple.  Cardiovascular: Normal rate and regular rhythm.  Exam reveals no friction rub.   No murmur heard. Pulmonary/Chest: Effort normal and breath sounds normal. No respiratory distress. She has no wheezes. She has no rales.  Abdominal: Soft. She exhibits no distension. There is no tenderness. There is no rebound.  Musculoskeletal: Normal range of motion. She exhibits no edema.  Neurological: She is alert  and oriented to person, place, and time.  Skin: She is not diaphoretic.  Nursing note and vitals reviewed.   ED Course  Procedures (including critical care time) Labs Review Labs Reviewed  URINALYSIS, ROUTINE W REFLEX MICROSCOPIC - Abnormal; Notable for the following:    APPearance CLOUDY (*)    Glucose, UA 100 (*)    Hgb urine dipstick LARGE (*)    All other components within normal limits  URINE MICROSCOPIC-ADD ON - Abnormal; Notable for the following:    Squamous Epithelial / LPF MANY (*)    Bacteria, UA MANY (*)    All other components within normal limits  CBC  COMPREHENSIVE METABOLIC PANEL  LIPASE, BLOOD  I-STAT CG4 LACTIC ACID, ED    Imaging Review No results found.   EKG Interpretation None      MDM   Final diagnoses:  Diarrhea    70F here with diarrhea for the past few days. Feeling weak and achy now. No recent travel, antibiotics. Associated nausea, denies any vomiting to me. Abdomen benign. Vitals stable here. Will check labs, hydrate. Labs ok. Stable for discharge.  Elwin Mocha, MD 01/27/15 [phone]

## 2015-01-27 NOTE — Discharge Instructions (Signed)

## 2015-01-27 NOTE — ED Notes (Signed)
N/V/D since Friday. Unable to hold fluids down. Weak, body aches.

## 2015-03-01 DIAGNOSIS — E559 Vitamin D deficiency, unspecified: Secondary | ICD-10-CM | POA: Insufficient documentation

## 2015-03-04 ENCOUNTER — Encounter (HOSPITAL_BASED_OUTPATIENT_CLINIC_OR_DEPARTMENT_OTHER): Payer: Self-pay | Admitting: *Deleted

## 2015-03-04 ENCOUNTER — Emergency Department (HOSPITAL_BASED_OUTPATIENT_CLINIC_OR_DEPARTMENT_OTHER): Payer: Managed Care, Other (non HMO)

## 2015-03-04 ENCOUNTER — Emergency Department (HOSPITAL_BASED_OUTPATIENT_CLINIC_OR_DEPARTMENT_OTHER)
Admission: EM | Admit: 2015-03-04 | Discharge: 2015-03-04 | Disposition: A | Discharge status: Home / Self Care | Payer: Managed Care, Other (non HMO) | Attending: Emergency Medicine | Admitting: Emergency Medicine

## 2015-03-04 DIAGNOSIS — Z87442 Personal history of urinary calculi: Secondary | ICD-10-CM | POA: Insufficient documentation

## 2015-03-04 DIAGNOSIS — E785 Hyperlipidemia, unspecified: Secondary | ICD-10-CM | POA: Insufficient documentation

## 2015-03-04 DIAGNOSIS — Z9049 Acquired absence of other specified parts of digestive tract: Secondary | ICD-10-CM | POA: Insufficient documentation

## 2015-03-04 DIAGNOSIS — Z9889 Other specified postprocedural states: Secondary | ICD-10-CM | POA: Diagnosis not present

## 2015-03-04 DIAGNOSIS — Z8742 Personal history of other diseases of the female genital tract: Secondary | ICD-10-CM | POA: Diagnosis not present

## 2015-03-04 DIAGNOSIS — Z793 Long term (current) use of hormonal contraceptives: Secondary | ICD-10-CM | POA: Diagnosis not present

## 2015-03-04 DIAGNOSIS — Z8669 Personal history of other diseases of the nervous system and sense organs: Secondary | ICD-10-CM | POA: Diagnosis not present

## 2015-03-04 DIAGNOSIS — Z79899 Other long term (current) drug therapy: Secondary | ICD-10-CM | POA: Diagnosis not present

## 2015-03-04 DIAGNOSIS — Z87440 Personal history of urinary (tract) infections: Secondary | ICD-10-CM | POA: Diagnosis not present

## 2015-03-04 DIAGNOSIS — Z8719 Personal history of other diseases of the digestive system: Secondary | ICD-10-CM | POA: Diagnosis not present

## 2015-03-04 DIAGNOSIS — Z8679 Personal history of other diseases of the circulatory system: Secondary | ICD-10-CM | POA: Insufficient documentation

## 2015-03-04 DIAGNOSIS — R109 Unspecified abdominal pain: Secondary | ICD-10-CM | POA: Diagnosis present

## 2015-03-04 DIAGNOSIS — R319 Hematuria, unspecified: Secondary | ICD-10-CM | POA: Insufficient documentation

## 2015-03-04 DIAGNOSIS — Z3202 Encounter for pregnancy test, result negative: Secondary | ICD-10-CM | POA: Diagnosis not present

## 2015-03-04 DIAGNOSIS — E119 Type 2 diabetes mellitus without complications: Secondary | ICD-10-CM | POA: Insufficient documentation

## 2015-03-04 DIAGNOSIS — Z90710 Acquired absence of both cervix and uterus: Secondary | ICD-10-CM | POA: Insufficient documentation

## 2015-03-04 LAB — URINALYSIS, ROUTINE W REFLEX MICROSCOPIC
Bilirubin Urine: NEGATIVE
GLUCOSE, UA: NEGATIVE mg/dL
Ketones, ur: NEGATIVE mg/dL
LEUKOCYTES UA: NEGATIVE
NITRITE: NEGATIVE
PROTEIN: NEGATIVE mg/dL
SPECIFIC GRAVITY, URINE: 1.008 (ref 1.005–1.030)
Urobilinogen, UA: 0.2 mg/dL (ref 0.0–1.0)
pH: 5.5 (ref 5.0–8.0)

## 2015-03-04 LAB — PREGNANCY, URINE: PREG TEST UR: NEGATIVE

## 2015-03-04 LAB — URINE MICROSCOPIC-ADD ON

## 2015-03-04 MED ORDER — KETOROLAC TROMETHAMINE 60 MG/2ML IM SOLN
60.0000 mg | Freq: Once | INTRAMUSCULAR | Status: AC
  Administered 2015-03-04: 60 mg via INTRAMUSCULAR
  Filled 2015-03-04: qty 2

## 2015-03-04 MED ORDER — OXYCODONE-ACETAMINOPHEN 5-325 MG PO TABS
2.0000 | ORAL_TABLET | Freq: Once | ORAL | Status: AC
  Administered 2015-03-04: 2 via ORAL
  Filled 2015-03-04: qty 2

## 2015-03-04 MED ORDER — OXYCODONE-ACETAMINOPHEN 5-325 MG PO TABS: 2.0000 | ORAL_TABLET | Freq: Once | ORAL | Status: DC

## 2015-03-04 MED ORDER — PROMETHAZINE HCL 25 MG PO TABS: 25.0000 mg | ORAL_TABLET | Freq: Once | ORAL | Status: DC

## 2015-03-04 MED ORDER — PROMETHAZINE HCL 25 MG PO TABS
25.0000 mg | ORAL_TABLET | Freq: Once | ORAL | Status: AC
  Administered 2015-03-04: 25 mg via ORAL
  Filled 2015-03-04: qty 1

## 2015-03-04 NOTE — ED Notes (Signed)
Patient transported to Ultrasound 

## 2015-03-04 NOTE — ED Notes (Signed)
Pt c/o right flank pain that began yesterday and has steadily gotten worse. Pt has hx of kidney stones and sts this pain feels similar.

## 2015-03-04 NOTE — Discharge Instructions (Signed)

## 2015-03-04 NOTE — ED Provider Notes (Signed)
CSN: [id number]     Arrival date & time 03/04/15  1442 History  This chart was scribed for Nelva Nay, MD by Tonye Royalty, ED Scribe. This patient was seen in room MH05/MH05 and the patient's care was started at 3:27 PM.   Chief Complaint  Patient presents with  . Flank Pain   The history is provided by the patient and medical records. No language interpreter was used.    HPI Comments: [name] is a 44 y.o. female who presents to the Emergency Department complaining of right flank pain with onset yesterday, worsening progressively. She states there is a "shock" like feeling radiating lower. She reports associated hematuria, 2 counts of vomiting. She reports history of kidney stones and states this feels similar. She had CT scan here in September of 2015 for similar complaint, she states had another in December in [place]. She states she no longer has periods since she her hysterectomy. She has had multiple other abdominal surgeries including cholecystectomy and appendectomy.  Past Medical History  Diagnosis Date  . Hyperlipidemia   . Diabetes mellitus   . Kidney stones   . Recurrent UTI   . Non-alcoholic fatty liver disease   . Weight decrease   . Chills   . Fatigue     loss of sleep  . Dizziness   . Generalized headaches   . Wears glasses   . Blood in urine   . Liver disease   . Poor appetite   . Abdominal pain   . Blurred vision     when feeling very fatigued   . Ovarian cyst   . Thrombophlebitis    Past Surgical History  Procedure Laterality Date  . Cholecystectomy  2005  . Kidney stones    . Gastric bypass  10/2010  . Tubal ligation      x3  . Abdominal hysterectomy  2008    for polycystic ovaries  . Cystostomy w/ bladder biopsy    . Exploratory laparotomy  09/01/11  . Laparoscopy  10/19/2011    Procedure: LAPAROSCOPY OPERATIVE;  Surgeon: Miguel Aschoff;  Location: [facility];  Service: Gynecology;  Laterality: N/A;  Operative Laparoscopy with Lysis Of Adhesions  .  Laparotomy  10/19/2011    Procedure: LAPAROTOMY;  Surgeon: Miguel Aschoff;  Location: [facility];  Service: Gynecology;  Laterality: Bilateral;  Laparotomy With Bilateral Oophorectomy  . Bilateral salpingoophorectomy  2012    Dr Tenny Craw  . Colonoscopy      negative, done for pain  . Upper gastrointestinal endoscopy      to assess varices  . Appendectomy     Family History  Problem Relation Age of Onset  . Lung cancer Mother   . Cancer Mother     lung  . Hyperlipidemia Father   . Heart disease Father   . Stroke Father   . Hypertension Father   . Diabetes Father   . Hypertension Brother   . Other Brother     suicide   History  Substance Use Topics  . Smoking status: Never Smoker   . Smokeless tobacco: Never Used  . Alcohol Use: No     Comment: never   OB History    No data available     Review of Systems  Gastrointestinal: Positive for nausea and vomiting.  Genitourinary: Positive for hematuria and flank pain.  All other systems reviewed and are negative.     Allergies  Review of patient's allergies indicates no known allergies.  Home Medications  Prior to Admission medications   Medication Sig Start Date End Date Taking? Authorizing Provider  allopurinol (ZYLOPRIM) 100 MG tablet Take 100 mg by mouth daily.    Historical Provider, MD  ALPRAZolam Prudy Feeler(XANAX) 0.5 MG tablet TAKE 1 TABLET BY MOUTH 3 TIMES A DAY 08/23/13   Sheliah HatchKatherine E Tabori, MD  buPROPion (WELLBUTRIN XL) 150 MG 24 hr tablet Take 1 tablet (150 mg total) by mouth daily. 06/06/13   Sheliah HatchKatherine E Tabori, MD  estradiol (ESTRACE) 1 MG tablet Take 1 mg by mouth daily.    Historical Provider, MD  hydrochlorothiazide (HYDRODIURIL) 25 MG tablet Take 12.5 mg by mouth daily.    Historical Provider, MD  HYDROmorphone (DILAUDID) 2 MG tablet Take 1 tablet (2 mg total) by mouth every 4 (four) hours as needed for pain. 07/06/13   Dorathy KinsmanVirginia Smith, CNM  loperamide (IMODIUM) 2 MG capsule Take 1 capsule (2 mg total) by mouth 4 (four) times  daily as needed for diarrhea or loose stools. 01/27/15   Elwin MochaBlair Walden, MD  metoCLOPramide (REGLAN) 10 MG tablet Take 5 mg by mouth 4 (four) times daily as needed (nausea and vomiting).  06/30/13 07/10/13  Historical Provider, MD  metoCLOPramide (REGLAN) 10 MG tablet Take 1 tablet (10 mg total) by mouth 3 (three) times daily with meals. 07/06/13 07/06/14  Dorathy KinsmanVirginia Smith, CNM  nortriptyline (PAMELOR) 25 MG capsule Take 1 capsule (25 mg total) by mouth at bedtime. 08/24/13   Drema DallasAdam R Jaffe, DO  omeprazole (PRILOSEC) 20 MG capsule Take 1 capsule (20 mg total) by mouth daily. 09/08/14   April Palumbo, MD  oxyCODONE-acetaminophen (PERCOCET/ROXICET) 5-325 MG per tablet Take 2 tablets by mouth once. 03/04/15   Nelva Nayobert Caylea Foronda, MD  pantoprazole (PROTONIX) 40 MG tablet Take 40 mg by mouth 2 (two) times daily.  06/24/13 06/24/14  Historical Provider, MD  promethazine (PHENERGAN) 25 MG tablet Take 1 tablet (25 mg total) by mouth once. 03/04/15   Nelva Nayobert Capers Hagmann, MD  rosuvastatin (CRESTOR) 20 MG tablet Take 1 tablet (20 mg total) by mouth daily. 10/11/12 10/11/13  Sheliah HatchKatherine E Tabori, MD  tamsulosin (FLOMAX) 0.4 MG CAPS capsule Take 1 capsule (0.4 mg total) by mouth daily after supper. 05/07/14   Gwyneth SproutWhitney Plunkett, MD  topiramate (TOPAMAX) 25 MG tablet Take 25 mg by mouth 2 (two) times daily.  06/24/13 06/24/14  Historical Provider, MD  zolpidem (AMBIEN CR) 12.5 MG CR tablet Take 1 tablet (12.5 mg total) by mouth at bedtime as needed for sleep. 09/04/13   Sheliah HatchKatherine E Tabori, MD   BP 91/59 mmHg  Pulse 76  Temp(Src) 98.5 F (36.9 C) (Oral)  Resp 18  Ht 5\' 3"  (1.6 m)  Wt 208 lb (94.348 kg)  BMI 36.85 kg/m2  SpO2 98% Physical Exam  Constitutional: She is oriented to person, place, and time. She appears well-developed and well-nourished. No distress.  HENT:  Head: Normocephalic and atraumatic.  Eyes: Pupils are equal, round, and reactive to light.  Neck: Normal range of motion.  Cardiovascular: Normal rate and intact distal pulses.    Pulmonary/Chest: No respiratory distress.  Abdominal: Soft. Normal appearance and bowel sounds are normal. She exhibits no distension. There is no tenderness. There is no rebound and no guarding.  Musculoskeletal: Normal range of motion.  Neurological: She is alert and oriented to person, place, and time. No cranial nerve deficit.  Skin: Skin is warm and dry. No rash noted.  Psychiatric: She has a normal mood and affect. Her behavior is normal.  Nursing note and  vitals reviewed.   ED Course  Procedures (including critical care time) Medications  oxyCODONE-acetaminophen (PERCOCET/ROXICET) 5-325 MG per tablet 2 tablet (2 tablets Oral Given 03/04/15 1633)  promethazine (PHENERGAN) tablet 25 mg (25 mg Oral Given 03/04/15 1633)  ketorolac (TORADOL) injection 60 mg (60 mg Intramuscular Given 03/04/15 1633)     DIAGNOSTIC STUDIES: Oxygen Saturation is 98% on room air, normal by my interpretation.    COORDINATION OF CARE: 3:35 PM Discussed treatment plan with patient at beside, including renal ultrasound and UA. The patient agrees with the plan and has no further questions at this time.   Labs Review Labs Reviewed  URINALYSIS, ROUTINE W REFLEX MICROSCOPIC - Abnormal; Notable for the following:    APPearance CLOUDY (*)    Hgb urine dipstick LARGE (*)    All other components within normal limits  URINE MICROSCOPIC-ADD ON - Abnormal; Notable for the following:    Squamous Epithelial / LPF FEW (*)    Bacteria, UA FEW (*)    All other components within normal limits  URINE CULTURE  PREGNANCY, URINE    Imaging Review US Renal  03/04/2015   CLINICAL DATA:  RIGHT flank and back pain for 2 days increased in intensity since 1430 hours, history kidney stones, diabetes  EXAM: RENAL/URINARY TRACT ULTRASOUND COMPLETE  COMPARISON:  CT abdomen and pelvis 09/08/2014  FINDINGS: Right Kidney:  Length: 11.5 cm. Normal cortical thickness echogenicity. No mass or hydronephrosis. Multiple echogenic non  shadowing foci within RIGHT kidney, question artifacts versus tiny non shadowing/nonobstructing calculi.  Left Kidney:  Length: 13.5 cm. Normal cortical thickness and echogenicity. No mass or hydronephrosis. Scattered non shadowing echogenic foci within LEFT kidney, question artifact versus tiny non shadowing/nonobstructing calculi.  Bladder:  Normal distention without mass or wall thickening. Minimal dependent debris.  IMPRESSION: No evidence of renal mass or hydronephrosis.  Tiny non shadowing echogenic foci in both kidneys, could represent artifacts or tiny non shadowing calculi; only a single tiny calcification was identified within the RIGHT kidney on the prior CT exam.   Electronically Signed   By: Ulyses Southward M.D.   On: 03/04/2015 17:39      MDM   Final diagnoses:  Hematuria  Flank pain   I personally performed the services described in this documentation, which was scribed in my presence. The recorded information has been reviewed and considered.   Nelva Nay, MD 03/04/15 [phone]

## 2015-03-06 LAB — URINE CULTURE
Colony Count: 3000
Special Requests: NORMAL

## 2015-10-01 ENCOUNTER — Encounter (HOSPITAL_BASED_OUTPATIENT_CLINIC_OR_DEPARTMENT_OTHER): Payer: Self-pay | Admitting: Emergency Medicine

## 2015-10-01 ENCOUNTER — Emergency Department (HOSPITAL_BASED_OUTPATIENT_CLINIC_OR_DEPARTMENT_OTHER)
Admission: EM | Admit: 2015-10-01 | Discharge: 2015-10-01 | Disposition: A | Discharge status: Home / Self Care | Payer: Managed Care, Other (non HMO) | Attending: Emergency Medicine | Admitting: Emergency Medicine

## 2015-10-01 ENCOUNTER — Emergency Department (HOSPITAL_BASED_OUTPATIENT_CLINIC_OR_DEPARTMENT_OTHER): Payer: Managed Care, Other (non HMO)

## 2015-10-01 DIAGNOSIS — Z9089 Acquired absence of other organs: Secondary | ICD-10-CM | POA: Diagnosis not present

## 2015-10-01 DIAGNOSIS — Z9049 Acquired absence of other specified parts of digestive tract: Secondary | ICD-10-CM | POA: Insufficient documentation

## 2015-10-01 DIAGNOSIS — R1013 Epigastric pain: Secondary | ICD-10-CM | POA: Insufficient documentation

## 2015-10-01 DIAGNOSIS — Z87442 Personal history of urinary calculi: Secondary | ICD-10-CM | POA: Diagnosis not present

## 2015-10-01 DIAGNOSIS — E119 Type 2 diabetes mellitus without complications: Secondary | ICD-10-CM | POA: Diagnosis not present

## 2015-10-01 DIAGNOSIS — E785 Hyperlipidemia, unspecified: Secondary | ICD-10-CM | POA: Insufficient documentation

## 2015-10-01 DIAGNOSIS — E860 Dehydration: Secondary | ICD-10-CM | POA: Diagnosis not present

## 2015-10-01 DIAGNOSIS — Z79899 Other long term (current) drug therapy: Secondary | ICD-10-CM | POA: Diagnosis not present

## 2015-10-01 DIAGNOSIS — Z8742 Personal history of other diseases of the female genital tract: Secondary | ICD-10-CM | POA: Insufficient documentation

## 2015-10-01 DIAGNOSIS — Z90710 Acquired absence of both cervix and uterus: Secondary | ICD-10-CM | POA: Diagnosis not present

## 2015-10-01 DIAGNOSIS — Z9851 Tubal ligation status: Secondary | ICD-10-CM | POA: Diagnosis not present

## 2015-10-01 DIAGNOSIS — Z87440 Personal history of urinary (tract) infections: Secondary | ICD-10-CM | POA: Diagnosis not present

## 2015-10-01 LAB — COMPREHENSIVE METABOLIC PANEL
ALBUMIN: 4.1 g/dL (ref 3.5–5.0)
ALT: 24 U/L (ref 14–54)
AST: 47 U/L — AB (ref 15–41)
Alkaline Phosphatase: 71 U/L (ref 38–126)
Anion gap: 9 (ref 5–15)
BUN: 21 mg/dL — AB (ref 6–20)
CHLORIDE: 101 mmol/L (ref 101–111)
CO2: 26 mmol/L (ref 22–32)
CREATININE: 1.35 mg/dL — AB (ref 0.44–1.00)
Calcium: 9 mg/dL (ref 8.9–10.3)
GFR calc Af Amer: 55 mL/min — ABNORMAL LOW (ref >60)
GFR, EST NON AFRICAN AMERICAN: 47 mL/min — AB (ref >60)
GLUCOSE: 158 mg/dL — AB (ref 65–99)
POTASSIUM: 4.6 mmol/L (ref 3.5–5.1)
Sodium: 136 mmol/L (ref 135–145)
Total Bilirubin: 0.6 mg/dL (ref 0.3–1.2)
Total Protein: 7.2 g/dL (ref 6.5–8.1)

## 2015-10-01 LAB — CBC WITH DIFFERENTIAL/PLATELET
Basophils Absolute: 0.1 10*3/uL (ref 0.0–0.1)
Basophils Relative: 1 %
EOS ABS: 0.1 10*3/uL (ref 0.0–0.7)
EOS PCT: 2 %
HCT: 40.2 % (ref 36.0–46.0)
Hemoglobin: 13.5 g/dL (ref 12.0–15.0)
LYMPHS ABS: 2.3 10*3/uL (ref 0.7–4.0)
LYMPHS PCT: 32 %
MCH: 28.4 pg (ref 26.0–34.0)
MCHC: 33.6 g/dL (ref 30.0–36.0)
MCV: 84.6 fL (ref 78.0–100.0)
MONO ABS: 0.3 10*3/uL (ref 0.1–1.0)
Monocytes Relative: 4 %
Neutro Abs: 4.5 10*3/uL (ref 1.7–7.7)
Neutrophils Relative %: 61 %
PLATELETS: 151 10*3/uL (ref 150–400)
RBC: 4.75 MIL/uL (ref 3.87–5.11)
RDW: 13.5 % (ref 11.5–15.5)
WBC: 7.3 10*3/uL (ref 4.0–10.5)

## 2015-10-01 LAB — URINALYSIS, ROUTINE W REFLEX MICROSCOPIC
Bilirubin Urine: NEGATIVE
Glucose, UA: NEGATIVE mg/dL
Ketones, ur: NEGATIVE mg/dL
Leukocytes, UA: NEGATIVE
Nitrite: NEGATIVE
Protein, ur: NEGATIVE mg/dL
SPECIFIC GRAVITY, URINE: 1.011 (ref 1.005–1.030)
UROBILINOGEN UA: 0.2 mg/dL (ref 0.0–1.0)
pH: 5 (ref 5.0–8.0)

## 2015-10-01 LAB — LIPASE, BLOOD: LIPASE: 44 U/L (ref 22–51)

## 2015-10-01 LAB — URINE MICROSCOPIC-ADD ON

## 2015-10-01 MED ORDER — ONDANSETRON HCL 4 MG/2ML IJ SOLN
4.0000 mg | Freq: Once | INTRAMUSCULAR | Status: AC
Start: 2015-10-01 — End: 2015-10-01
  Administered 2015-10-01: 4 mg via INTRAVENOUS
  Filled 2015-10-01: qty 2

## 2015-10-01 MED ORDER — SODIUM CHLORIDE 0.9 % IV BOLUS (SEPSIS)
1000.0000 mL | Freq: Once | INTRAVENOUS | Status: AC
  Administered 2015-10-01: 1000 mL via INTRAVENOUS

## 2015-10-01 MED ORDER — PROMETHAZINE HCL 25 MG PO TABS: 25.0000 mg | ORAL_TABLET | Freq: Four times a day (QID) | ORAL | Status: DC | PRN

## 2015-10-01 MED ORDER — GI COCKTAIL ~~LOC~~
30.0000 mL | Freq: Once | ORAL | Status: AC
  Administered 2015-10-01: 30 mL via ORAL
  Filled 2015-10-01: qty 30

## 2015-10-01 MED ORDER — HYDROMORPHONE HCL 1 MG/ML IJ SOLN
1.0000 mg | Freq: Once | INTRAMUSCULAR | Status: AC
  Administered 2015-10-01: 1 mg via INTRAVENOUS
  Filled 2015-10-01: qty 1

## 2015-10-01 MED ORDER — PROMETHAZINE HCL 25 MG/ML IJ SOLN
25.0000 mg | Freq: Once | INTRAMUSCULAR | Status: AC
  Administered 2015-10-01: 25 mg via INTRAMUSCULAR
  Filled 2015-10-01: qty 1

## 2015-10-01 NOTE — ED Provider Notes (Signed)
CSN: [id number]     Arrival date & time 10/01/15  1428 History   First MD Initiated Contact with Patient 10/01/15 1501     Chief Complaint  Patient presents with  . Abdominal Pain     Patient is a 44 y.o. female presenting with abdominal pain. The history is provided by the patient. No language interpreter was used.  Abdominal Pain  [name] presents for evaluation of abdominal pain. She reports about 1 week of mid epigastric abdominal pain. The pain is described as sharp in nature and radiates to her back. The pain is constant and worsens with meals and intermittently worsens without any clear cause. She has associated nausea with occasional vomiting. She denies any fevers, dysuria, diarrhea, constipation. She has episodes of diaphoresis during this pain and notes that her sugars elevated at that time. She has a history of diabetes, cholecystectomy, gastric bypass, cirrhosis, thrombocytopenia, renal colic.   Past Medical History  Diagnosis Date  . Hyperlipidemia   . Diabetes mellitus   . Kidney stones   . Recurrent UTI   . Non-alcoholic fatty liver disease   . Weight decrease   . Chills   . Fatigue     loss of sleep  . Dizziness   . Generalized headaches   . Wears glasses   . Blood in urine   . Liver disease   . Poor appetite   . Abdominal pain   . Blurred vision     when feeling very fatigued   . Ovarian cyst   . Thrombophlebitis    Past Surgical History  Procedure Laterality Date  . Cholecystectomy  2005  . Kidney stones    . Gastric bypass  10/2010  . Tubal ligation      x3  . Abdominal hysterectomy  2008    for polycystic ovaries  . Cystostomy w/ bladder biopsy    . Exploratory laparotomy  09/01/11  . Laparoscopy  10/19/2011    Procedure: LAPAROSCOPY OPERATIVE;  Surgeon: Miguel Aschoff;  Location: [facility];  Service: Gynecology;  Laterality: N/A;  Operative Laparoscopy with Lysis Of Adhesions  . Laparotomy  10/19/2011    Procedure: LAPAROTOMY;  Surgeon: Miguel Aschoff;   Location: [facility];  Service: Gynecology;  Laterality: Bilateral;  Laparotomy With Bilateral Oophorectomy  . Bilateral salpingoophorectomy  2012    Dr Tenny Craw  . Colonoscopy      negative, done for pain  . Upper gastrointestinal endoscopy      to assess varices  . Appendectomy     Family History  Problem Relation Age of Onset  . Lung cancer Mother   . Cancer Mother     lung  . Hyperlipidemia Father   . Heart disease Father   . Stroke Father   . Hypertension Father   . Diabetes Father   . Hypertension Brother   . Other Brother     suicide   Social History  Substance Use Topics  . Smoking status: Never Smoker   . Smokeless tobacco: Never Used  . Alcohol Use: No     Comment: never   OB History    No data available     Review of Systems  Gastrointestinal: Positive for abdominal pain.  All other systems reviewed and are negative.     Allergies  Review of patient's allergies indicates no known allergies.  Home Medications   Prior to Admission medications   Medication Sig Start Date End Date Taking? Authorizing Provider  allopurinol (ZYLOPRIM) 100 MG  tablet Take 100 mg by mouth daily.    Historical Provider, MD  ALPRAZolam Prudy Feeler) 0.5 MG tablet TAKE 1 TABLET BY MOUTH 3 TIMES A DAY 08/23/13   Sheliah Hatch, MD  buPROPion (WELLBUTRIN XL) 150 MG 24 hr tablet Take 1 tablet (150 mg total) by mouth daily. 06/06/13   Sheliah Hatch, MD  estradiol (ESTRACE) 1 MG tablet Take 1 mg by mouth daily.    Historical Provider, MD  hydrochlorothiazide (HYDRODIURIL) 25 MG tablet Take 12.5 mg by mouth daily.    Historical Provider, MD  HYDROmorphone (DILAUDID) 2 MG tablet Take 1 tablet (2 mg total) by mouth every 4 (four) hours as needed for pain. 07/06/13   Dorathy Kinsman, CNM  loperamide (IMODIUM) 2 MG capsule Take 1 capsule (2 mg total) by mouth 4 (four) times daily as needed for diarrhea or loose stools. 01/27/15   Elwin Mocha, MD  metoCLOPramide (REGLAN) 10 MG tablet Take 5 mg by  mouth 4 (four) times daily as needed (nausea and vomiting).  06/30/13 07/10/13  Historical Provider, MD  metoCLOPramide (REGLAN) 10 MG tablet Take 1 tablet (10 mg total) by mouth 3 (three) times daily with meals. 07/06/13 07/06/14  Dorathy Kinsman, CNM  nortriptyline (PAMELOR) 25 MG capsule Take 1 capsule (25 mg total) by mouth at bedtime. 08/24/13   Drema Dallas, DO  omeprazole (PRILOSEC) 20 MG capsule Take 1 capsule (20 mg total) by mouth daily. 09/08/14   April Palumbo, MD  oxyCODONE-acetaminophen (PERCOCET/ROXICET) 5-325 MG per tablet Take 2 tablets by mouth once. 03/04/15   Nelva Nay, MD  pantoprazole (PROTONIX) 40 MG tablet Take 40 mg by mouth 2 (two) times daily.  06/24/13 06/24/14  Historical Provider, MD  promethazine (PHENERGAN) 25 MG tablet Take 1 tablet (25 mg total) by mouth once. 03/04/15   Nelva Nay, MD  rosuvastatin (CRESTOR) 20 MG tablet Take 1 tablet (20 mg total) by mouth daily. 10/11/12 10/11/13  Sheliah Hatch, MD  tamsulosin (FLOMAX) 0.4 MG CAPS capsule Take 1 capsule (0.4 mg total) by mouth daily after supper. 05/07/14   Gwyneth Sprout, MD  topiramate (TOPAMAX) 25 MG tablet Take 25 mg by mouth 2 (two) times daily.  06/24/13 06/24/14  Historical Provider, MD  zolpidem (AMBIEN CR) 12.5 MG CR tablet Take 1 tablet (12.5 mg total) by mouth at bedtime as needed for sleep. 09/04/13   Sheliah Hatch, MD   BP 117/72 mmHg  Pulse 95  Temp(Src) 98.1 F (36.7 C) (Oral)  Resp 16  Ht  (1.626 m)  Wt 223 lb (101.152 kg)  BMI 38.26 kg/m2  SpO2 100% Physical Exam  Constitutional: She is oriented to person, place, and time. She appears well-developed and well-nourished.  HENT:  Head: Normocephalic and atraumatic.  Cardiovascular: Normal rate and regular rhythm.   No murmur heard. Pulmonary/Chest: Effort normal and breath sounds normal. No respiratory distress.  Abdominal: Soft. There is no rebound and no guarding.  Mild to moderate epigastric tenderness without guarding or rebound   Musculoskeletal: She exhibits no edema or tenderness.  Neurological: She is alert and oriented to person, place, and time.  Skin: Skin is warm and dry.  Psychiatric: She has a normal mood and affect. Her behavior is normal.  Nursing note and vitals reviewed.   ED Course  Procedures (including critical care time) Labs Review Labs Reviewed  URINALYSIS, ROUTINE W REFLEX MICROSCOPIC (NOT AT [facility]) - Abnormal; Notable for the following:    APPearance CLOUDY (*)  Hgb urine dipstick LARGE (*)    All other components within normal limits  URINE MICROSCOPIC-ADD ON - Abnormal; Notable for the following:    Bacteria, UA FEW (*)    Casts HYALINE CASTS (*)    All other components within normal limits  COMPREHENSIVE METABOLIC PANEL - Abnormal; Notable for the following:    Glucose, Bld 158 (*)    BUN 21 (*)    Creatinine, Ser 1.35 (*)    AST 47 (*)    GFR calc non Af Amer 47 (*)    GFR calc Af Amer 55 (*)    All other components within normal limits  CBC WITH DIFFERENTIAL/PLATELET  LIPASE, BLOOD    Imaging Review Dg Abd Acute W/chest  10/01/2015   CLINICAL DATA:  Epigastric pain radiates to mid upper back with N/V/C x 1 week. HX: Diabetes, Gastric Bypass, cholecystectomy, appendectomy, Tubal ligation, Hysterectomy, Kidney stones. Nonsmoker, shielded  EXAM: DG ABDOMEN ACUTE W/ 1V CHEST  COMPARISON:  10/21/2014  FINDINGS: Cardiomediastinal silhouette is within normal limits. The lungs are free of focal consolidations and pleural effusions. No pulmonary edema.  No free intraperitoneal air beneath the diaphragm. Surgical clips are noted in the upper and central abdomen. Bowel gas pattern is nonobstructed.  IMPRESSION: No evidence for acute  abnormality.   Electronically Signed   By: Norva Pavlov M.D.   On: 10/01/2015 17:35   I have personally reviewed and evaluated these images and lab results as part of my medical decision-making.   EKG Interpretation None      MDM   Final  diagnoses:  Epigastric pain  Dehydration    Patient here for evaluation of epigastric pain, nausea, vomiting. Symptoms are partially improved on repeat evaluation in the emergency department. Presentation is not consistent with bowel obstruction, biliary obstruction, perforated gastric ulcer. Discussed oral hydration, PCP follow-up, return precautions. Treating for possible peptic ulcer vs gastritis with Protonix.  Tilden Fossa, MD 10/01/15 2115

## 2015-10-01 NOTE — Discharge Instructions (Signed)
Drink plenty of fluids.  Take your protonix twice daily for the next week.  Your Kidney function needs to be rechecked by your family doctor in the next few days.  Your Creatinine was 1.35 and your BUN was 21 today.     Dehydration, Adult Dehydration is a condition in which you do not have enough fluid or water in your body. It happens when you take in less fluid than you lose. Vital organs such as the kidneys, brain, and heart cannot function without a proper amount of fluids. Any loss of fluids from the body can cause dehydration.  Dehydration can range from mild to severe. This condition should be treated right away to help prevent it from becoming severe. CAUSES  This condition may be caused by:  Vomiting.  Diarrhea.  Excessive sweating, such as when exercising in hot or humid weather.  Not drinking enough fluid during strenuous exercise or during an illness.  Excessive urine output.  Fever.  Certain medicines. RISK FACTORS This condition is more likely to develop in:  People who are taking certain medicines that cause the body to lose excess fluid (diuretics).   People who have a chronic illness, such as diabetes, that may increase urination.  Older adults.   People who live at high altitudes.   People who participate in endurance sports.  SYMPTOMS  Mild Dehydration  Thirst.  Dry lips.  Slightly dry mouth.  Dry, warm skin. Moderate Dehydration  Very dry mouth.   Muscle cramps.   Dark urine and decreased urine production.   Decreased tear production.   Headache.   Light-headedness, especially when you stand up from a sitting position.  Severe Dehydration  Changes in skin.   Cold and clammy skin.   Skin does not spring back quickly when lightly pinched and released.   Changes in body fluids.   Extreme thirst.   No tears.   Not able to sweat when body temperature is high, such as in hot weather.   Minimal urine production.    Changes in vital signs.   Rapid, weak pulse (more than 100 beats per minute when you are sitting still).   Rapid breathing.   Low blood pressure.   Other changes.   Sunken eyes.   Cold hands and feet.   Confusion.  Lethargy and difficulty being awakened.  Fainting (syncope).   Short-term weight loss.   Unconsciousness. DIAGNOSIS  This condition may be diagnosed based on your symptoms. You may also have tests to determine how severe your dehydration is. These tests may include:   Urine tests.   Blood tests.  TREATMENT  Treatment for this condition depends on the severity. Mild or moderate dehydration can often be treated at home. Treatment should be started right away. Do not wait until dehydration becomes severe. Severe dehydration needs to be treated at the hospital. Treatment for Mild Dehydration  Drinking plenty of water to replace the fluid you have lost.   Replacing minerals in your blood (electrolytes) that you may have lost.  Treatment for Moderate Dehydration  Consuming oral rehydration solution (ORS). Treatment for Severe Dehydration  Receiving fluid through an IV tube.   Receiving electrolyte solution through a feeding tube that is passed through your nose and into your stomach (nasogastric tube or NG tube).  Correcting any abnormalities in electrolytes. HOME CARE INSTRUCTIONS   Drink enough fluid to keep your urine clear or pale yellow.   Drink water or fluid slowly by taking small sips. You  can also try sucking on ice cubes.  Have food or beverages that contain electrolytes. Examples include bananas and sports drinks.  Take over-the-counter and prescription medicines only as told by your health care provider.   Prepare ORS according to the manufacturer's instructions. Take sips of ORS every 5 minutes until your urine returns to normal.  If you have vomiting or diarrhea, continue to try to drink water, ORS, or both.   If  you have diarrhea, avoid:   Beverages that contain caffeine.   Fruit juice.   Milk.   Carbonated soft drinks.  Do not take salt tablets. This can lead to the condition of having too much sodium in your body (hypernatremia).  SEEK MEDICAL CARE IF:  You cannot eat or drink without vomiting.  You have had moderate diarrhea during a period of more than 24 hours.  You have a fever. SEEK IMMEDIATE MEDICAL CARE IF:   You have extreme thirst.  You have severe diarrhea.  You have not urinated in 6-8 hours, or you have urinated only a small amount of very dark urine.  You have shriveled skin.  You are dizzy, confused, or both.   This information is not intended to replace advice given to you by your health care provider. Make sure you discuss any questions you have with your health care provider.   Document Released: 12/14/2005 Document Revised: 09/04/2015 Document Reviewed: 05/01/2015 Elsevier Interactive Patient Education 2016 [employer] Gastritis, Adult Gastritis is soreness and swelling (inflammation) of the lining of the stomach. Gastritis can develop as a sudden onset (acute) or long-term (chronic) condition. If gastritis is not treated, it can lead to stomach bleeding and ulcers. CAUSES  Gastritis occurs when the stomach lining is weak or damaged. Digestive juices from the stomach then inflame the weakened stomach lining. The stomach lining may be weak or damaged due to viral or bacterial infections. One common bacterial infection is the Helicobacter pylori infection. Gastritis can also result from excessive alcohol consumption, taking certain medicines, or having too much acid in the stomach.  SYMPTOMS  In some cases, there are no symptoms. When symptoms are present, they may include:  Pain or a burning sensation in the upper abdomen.  Nausea.  Vomiting.  An uncomfortable feeling of fullness after eating. DIAGNOSIS  Your caregiver may suspect you have  gastritis based on your symptoms and a physical exam. To determine the cause of your gastritis, your caregiver may perform the following:  Blood or stool tests to check for the H pylori bacterium.  Gastroscopy. A thin, flexible tube (endoscope) is passed down the esophagus and into the stomach. The endoscope has a light and camera on the end. Your caregiver uses the endoscope to view the inside of the stomach.  Taking a tissue sample (biopsy) from the stomach to examine under a microscope. TREATMENT  Depending on the cause of your gastritis, medicines may be prescribed. If you have a bacterial infection, such as an H pylori infection, antibiotics may be given. If your gastritis is caused by too much acid in the stomach, H2 blockers or antacids may be given. Your caregiver may recommend that you stop taking aspirin, ibuprofen, or other nonsteroidal anti-inflammatory drugs (NSAIDs). HOME CARE INSTRUCTIONS  Only take over-the-counter or prescription medicines as directed by your caregiver.  If you were given antibiotic medicines, take them as directed. Finish them even if you start to feel better.  Drink enough fluids to keep your urine clear or pale yellow.  Avoid  foods and drinks that make your symptoms worse, such as:  Caffeine or alcoholic drinks.  Chocolate.  Peppermint or mint flavorings.  Garlic and onions.  Spicy foods.  Citrus fruits, such as oranges, lemons, or limes.  Tomato-based foods such as sauce, chili, salsa, and pizza.  Fried and fatty foods.  Eat small, frequent meals instead of large meals. SEEK IMMEDIATE MEDICAL CARE IF:   You have black or dark red stools.  You vomit blood or material that looks like coffee grounds.  You are unable to keep fluids down.  Your abdominal pain gets worse.  You have a fever.  You do not feel better after 1 week.  You have any other questions or concerns. MAKE SURE YOU:  Understand these instructions.  Will watch  your condition.  Will get help right away if you are not doing well or get worse.   This information is not intended to replace advice given to you by your health care provider. Make sure you discuss any questions you have with your health care provider.   Document Released: 12/08/2001 Document Revised: 06/14/2012 Document Reviewed: 01/27/2012 Elsevier Interactive Patient Education [employer]

## 2015-10-01 NOTE — ED Notes (Signed)
Patient transported to X-ray 

## 2015-10-01 NOTE — ED Notes (Signed)
Patient reports that she is having mid epigastric pain that hurts through to her back.  Reports that this has been going on x 1 week with increased intensity

## 2015-12-25 ENCOUNTER — Encounter (HOSPITAL_BASED_OUTPATIENT_CLINIC_OR_DEPARTMENT_OTHER): Payer: Self-pay | Admitting: Emergency Medicine

## 2015-12-25 ENCOUNTER — Emergency Department (HOSPITAL_BASED_OUTPATIENT_CLINIC_OR_DEPARTMENT_OTHER)
Admission: EM | Admit: 2015-12-25 | Discharge: 2015-12-25 | Disposition: A | Discharge status: Home / Self Care | Payer: Managed Care, Other (non HMO) | Attending: Emergency Medicine | Admitting: Emergency Medicine

## 2015-12-25 ENCOUNTER — Emergency Department (HOSPITAL_BASED_OUTPATIENT_CLINIC_OR_DEPARTMENT_OTHER): Payer: Managed Care, Other (non HMO)

## 2015-12-25 DIAGNOSIS — Z8672 Personal history of thrombophlebitis: Secondary | ICD-10-CM | POA: Diagnosis not present

## 2015-12-25 DIAGNOSIS — Z8719 Personal history of other diseases of the digestive system: Secondary | ICD-10-CM | POA: Diagnosis not present

## 2015-12-25 DIAGNOSIS — Z87440 Personal history of urinary (tract) infections: Secondary | ICD-10-CM | POA: Diagnosis not present

## 2015-12-25 DIAGNOSIS — Z79899 Other long term (current) drug therapy: Secondary | ICD-10-CM | POA: Diagnosis not present

## 2015-12-25 DIAGNOSIS — N200 Calculus of kidney: Secondary | ICD-10-CM | POA: Insufficient documentation

## 2015-12-25 DIAGNOSIS — E119 Type 2 diabetes mellitus without complications: Secondary | ICD-10-CM | POA: Insufficient documentation

## 2015-12-25 DIAGNOSIS — E785 Hyperlipidemia, unspecified: Secondary | ICD-10-CM | POA: Insufficient documentation

## 2015-12-25 DIAGNOSIS — Z87442 Personal history of urinary calculi: Secondary | ICD-10-CM

## 2015-12-25 DIAGNOSIS — R109 Unspecified abdominal pain: Secondary | ICD-10-CM | POA: Diagnosis present

## 2015-12-25 DIAGNOSIS — Z8669 Personal history of other diseases of the nervous system and sense organs: Secondary | ICD-10-CM | POA: Diagnosis not present

## 2015-12-25 LAB — URINALYSIS, ROUTINE W REFLEX MICROSCOPIC
GLUCOSE, UA: 100 mg/dL — AB
Ketones, ur: 15 mg/dL — AB
Nitrite: NEGATIVE
PROTEIN: NEGATIVE mg/dL
SPECIFIC GRAVITY, URINE: 1.024 (ref 1.005–1.030)
pH: 6 (ref 5.0–8.0)

## 2015-12-25 LAB — URINE MICROSCOPIC-ADD ON

## 2015-12-25 MED ORDER — ONDANSETRON HCL 4 MG/2ML IJ SOLN
4.0000 mg | Freq: Once | INTRAMUSCULAR | Status: AC
Start: 2015-12-25 — End: 2015-12-25
  Administered 2015-12-25: 4 mg via INTRAVENOUS
  Filled 2015-12-25: qty 2

## 2015-12-25 MED ORDER — KETOROLAC TROMETHAMINE 30 MG/ML IJ SOLN
30.0000 mg | Freq: Once | INTRAMUSCULAR | Status: AC
  Administered 2015-12-25: 30 mg via INTRAVENOUS
  Filled 2015-12-25: qty 1

## 2015-12-25 MED ORDER — MORPHINE SULFATE (PF) 4 MG/ML IV SOLN
4.0000 mg | Freq: Once | INTRAVENOUS | Status: AC
Start: 2015-12-25 — End: 2015-12-25
  Administered 2015-12-25: 4 mg via INTRAVENOUS
  Filled 2015-12-25: qty 1

## 2015-12-25 NOTE — ED Notes (Signed)
Patient states that she has passed 2 kidney stones today and is still having pain to her right flank. The patient reports that she is having N/V - vomited about an hour ago.

## 2015-12-25 NOTE — ED Provider Notes (Signed)
CSN: [id number]     Arrival date & time 12/25/15  0435 History   First MD Initiated Contact with Patient 12/25/15 0448     Chief Complaint  Patient presents with  . Flank Pain     (Consider location/radiation/quality/duration/timing/severity/associated sxs/prior Treatment) HPI Comments: Patient is a 44 year old female with history of renal calculi. She presents today for evaluation of severe right earlier today. Reports blood in her urine but denies any dysuria. She denies fevers or chills. She denies abdominal pain or bowel complaints.  Patient is a 44 y.o. female presenting with flank pain. The history is provided by the patient.  Flank Pain This is a recurrent problem. The current episode started yesterday. The problem occurs constantly. The problem has been rapidly worsening. Pertinent negatives include no abdominal pain and no shortness of breath. Nothing aggravates the symptoms. Nothing relieves the symptoms. She has tried nothing for the symptoms.    Past Medical History  Diagnosis Date  . Hyperlipidemia   . Diabetes mellitus   . Kidney stones   . Recurrent UTI   . Non-alcoholic fatty liver disease   . Weight decrease   . Chills   . Fatigue     loss of sleep  . Dizziness   . Generalized headaches   . Wears glasses   . Blood in urine   . Liver disease   . Poor appetite   . Abdominal pain   . Blurred vision     when feeling very fatigued   . Ovarian cyst   . Thrombophlebitis    Past Surgical History  Procedure Laterality Date  . Cholecystectomy  2005  . Kidney stones    . Gastric bypass  10/2010  . Tubal ligation      x3  . Abdominal hysterectomy  2008    for polycystic ovaries  . Cystostomy w/ bladder biopsy    . Exploratory laparotomy  09/01/11  . Laparoscopy  10/19/2011    Procedure: LAPAROSCOPY OPERATIVE;  Surgeon: Miguel Aschoff;  Location: [facility];  Service: Gynecology;  Laterality: N/A;  Operative Laparoscopy with Lysis Of Adhesions  . Laparotomy  10/19/2011     Procedure: LAPAROTOMY;  Surgeon: Miguel Aschoff;  Location: [facility];  Service: Gynecology;  Laterality: Bilateral;  Laparotomy With Bilateral Oophorectomy  . Bilateral salpingoophorectomy  2012    Dr Tenny Craw  . Colonoscopy      negative, done for pain  . Upper gastrointestinal endoscopy      to assess varices  . Appendectomy     Family History  Problem Relation Age of Onset  . Lung cancer Mother   . Cancer Mother     lung  . Hyperlipidemia Father   . Heart disease Father   . Stroke Father   . Hypertension Father   . Diabetes Father   . Hypertension Brother   . Other Brother     suicide   Social History  Substance Use Topics  . Smoking status: Never Smoker   . Smokeless tobacco: Never Used  . Alcohol Use: No     Comment: never   OB History    No data available     Review of Systems  Respiratory: Negative for shortness of breath.   Gastrointestinal: Negative for abdominal pain.  Genitourinary: Positive for flank pain.  All other systems reviewed and are negative.     Allergies  Review of patient's allergies indicates no known allergies.  Home Medications   Prior to Admission medications   Medication  Sig Start Date End Date Taking? Authorizing Provider  allopurinol (ZYLOPRIM) 100 MG tablet Take 100 mg by mouth daily.    Historical Provider, MD  ALPRAZolam Prudy Feeler(XANAX) 0.5 MG tablet TAKE 1 TABLET BY MOUTH 3 TIMES A DAY 08/23/13   Sheliah HatchKatherine E Tabori, MD  estradiol (ESTRACE) 1 MG tablet Take 1 mg by mouth daily.    Historical Provider, MD  pantoprazole (PROTONIX) 40 MG tablet Take 40 mg by mouth 2 (two) times daily.  06/24/13 06/24/14  Historical Provider, MD  promethazine (PHENERGAN) 25 MG tablet Take 1 tablet (25 mg total) by mouth every 6 (six) hours as needed for nausea or vomiting. 10/01/15   Tilden FossaElizabeth Rees, MD  rosuvastatin (CRESTOR) 20 MG tablet Take 1 tablet (20 mg total) by mouth daily. 10/11/12 10/11/13  Sheliah HatchKatherine E Tabori, MD  zolpidem (AMBIEN CR) 12.5 MG CR tablet  Take 1 tablet (12.5 mg total) by mouth at bedtime as needed for sleep. 09/04/13   Sheliah HatchKatherine E Tabori, MD   BP 144/125 mmHg  Pulse 96  Temp(Src) 98 F (36.7 C) (Oral)  Resp 22  Ht 5\' 4"  (1.626 m)  Wt 227 lb (102.967 kg)  BMI 38.95 kg/m2  SpO2 99% Physical Exam  Constitutional: She is oriented to person, place, and time. She appears well-developed and well-nourished. No distress.  HENT:  Head: Normocephalic and atraumatic.  Neck: Normal range of motion. Neck supple.  Cardiovascular: Normal rate and regular rhythm.  Exam reveals no gallop and no friction rub.   No murmur heard. Pulmonary/Chest: Effort normal and breath sounds normal. No respiratory distress. She has no wheezes.  Abdominal: Soft. Bowel sounds are normal. She exhibits no distension. There is no tenderness.  There is mild right-sided CVA tenderness.  Musculoskeletal: Normal range of motion.  Neurological: She is alert and oriented to person, place, and time.  Skin: Skin is warm and dry. She is not diaphoretic.  Nursing note and vitals reviewed.   ED Course  Procedures (including critical care time) Labs Review Labs Reviewed  URINALYSIS, ROUTINE W REFLEX [facility]CROSCOPIC (NOT [facility])    Imaging Review No results found. I have personally reviewed and evaluated these images and lab results as part of my medical decision-making.    MDM   Final diagnoses:  Right flank pain    Renal CT reveals no obstructing stones, however her urinalysis does reveal blood. She reports that she passed a stone prior to coming here. This may indeed be the case and she could be experiencing some residual pain. Either way, she is feeling better after medications here in the ER. She will be discharged to home and advised to follow-up with her urologist if not improving in the next 1-2 days.    Geoffery Lyonsouglas Namine Beahm, MD 12/25/15 [phone]

## 2015-12-25 NOTE — Discharge Instructions (Signed)
Follow-up with your urologist if you're not improving in the next 1-2 days.   Flank Pain Flank pain refers to pain that is located on the side of the body between the upper abdomen and the back. The pain may occur over a short period of time (acute) or may be long-term or reoccurring (chronic). It may be mild or severe. Flank pain can be caused by many things. CAUSES  Some of the more common causes of flank pain include:  Muscle strains.   Muscle spasms.   A disease of your spine (vertebral disk disease).   A lung infection (pneumonia).   Fluid around your lungs (pulmonary edema).   A kidney infection.   Kidney stones.   A very painful skin rash caused by the chickenpox virus (shingles).   Gallbladder disease.  HOME CARE INSTRUCTIONS  Home care will depend on the cause of your pain. In general,  Rest as directed by your caregiver.  Drink enough fluids to keep your urine clear or pale yellow.  Only take over-the-counter or prescription medicines as directed by your caregiver. Some medicines may help relieve the pain.  Tell your caregiver about any changes in your pain.  Follow up with your caregiver as directed. SEEK IMMEDIATE MEDICAL CARE IF:   Your pain is not controlled with medicine.   You have new or worsening symptoms.  Your pain increases.   You have abdominal pain.   You have shortness of breath.   You have persistent nausea or vomiting.   You have swelling in your abdomen.   You feel faint or pass out.   You have blood in your urine.  You have a fever or persistent symptoms for more than 2-3 days.  You have a fever and your symptoms suddenly get worse. MAKE SURE YOU:   Understand these instructions.  Will watch your condition.  Will get help right away if you are not doing well or get worse.   This information is not intended to replace advice given to you by your health care provider. Make sure you discuss any questions you  have with your health care provider.   Document Released: 02/04/2006 Document Revised: 09/07/2012 Document Reviewed: 07/28/2012 Elsevier Interactive Patient Education [employer]2016 Elsevier Inc.

## 2016-01-28 DIAGNOSIS — N200 Calculus of kidney: Secondary | ICD-10-CM | POA: Insufficient documentation

## 2016-01-28 DIAGNOSIS — G4700 Insomnia, unspecified: Secondary | ICD-10-CM

## 2016-01-28 HISTORY — DX: Calculus of kidney: N20.0

## 2016-01-28 HISTORY — DX: Insomnia, unspecified: G47.00

## 2016-04-27 DIAGNOSIS — R609 Edema, unspecified: Secondary | ICD-10-CM

## 2016-04-27 DIAGNOSIS — R600 Localized edema: Secondary | ICD-10-CM | POA: Insufficient documentation

## 2016-04-27 HISTORY — DX: Edema, unspecified: R60.9

## 2016-04-27 HISTORY — DX: Localized edema: R60.0

## 2016-06-17 DIAGNOSIS — Z79890 Hormone replacement therapy (postmenopausal): Secondary | ICD-10-CM

## 2016-06-17 HISTORY — DX: Hormone replacement therapy: Z79.890

## 2016-07-02 DIAGNOSIS — R928 Other abnormal and inconclusive findings on diagnostic imaging of breast: Secondary | ICD-10-CM

## 2016-07-02 HISTORY — DX: Other abnormal and inconclusive findings on diagnostic imaging of breast: R92.8

## 2016-12-02 ENCOUNTER — Encounter (HOSPITAL_BASED_OUTPATIENT_CLINIC_OR_DEPARTMENT_OTHER): Payer: Self-pay | Admitting: *Deleted

## 2016-12-02 ENCOUNTER — Emergency Department (HOSPITAL_BASED_OUTPATIENT_CLINIC_OR_DEPARTMENT_OTHER)
Admission: EM | Admit: 2016-12-02 | Discharge: 2016-12-02 | Disposition: A | Discharge status: Home / Self Care | Payer: Managed Care, Other (non HMO) | Attending: Emergency Medicine | Admitting: Emergency Medicine

## 2016-12-02 DIAGNOSIS — G4489 Other headache syndrome: Secondary | ICD-10-CM | POA: Diagnosis not present

## 2016-12-02 DIAGNOSIS — E119 Type 2 diabetes mellitus without complications: Secondary | ICD-10-CM | POA: Diagnosis not present

## 2016-12-02 DIAGNOSIS — R51 Headache: Secondary | ICD-10-CM | POA: Diagnosis present

## 2016-12-02 DIAGNOSIS — Z7984 Long term (current) use of oral hypoglycemic drugs: Secondary | ICD-10-CM | POA: Diagnosis not present

## 2016-12-02 MED ORDER — METOCLOPRAMIDE HCL 10 MG PO TABS
10.0000 mg | ORAL_TABLET | Freq: Two times a day (BID) | ORAL | 0 refills | Status: DC | PRN
Start: 2016-12-02 — End: 2017-11-14

## 2016-12-02 MED ORDER — KETOROLAC TROMETHAMINE 30 MG/ML IJ SOLN
30.0000 mg | Freq: Once | INTRAMUSCULAR | Status: AC
  Administered 2016-12-02: 30 mg via INTRAVENOUS
  Filled 2016-12-02: qty 1

## 2016-12-02 MED ORDER — SODIUM CHLORIDE 0.9 % IV BOLUS (SEPSIS)
1000.0000 mL | Freq: Once | INTRAVENOUS | Status: AC
  Administered 2016-12-02: 1000 mL via INTRAVENOUS

## 2016-12-02 MED ORDER — DIPHENHYDRAMINE HCL 50 MG/ML IJ SOLN
50.0000 mg | Freq: Once | INTRAMUSCULAR | Status: AC
  Administered 2016-12-02: 50 mg via INTRAVENOUS
  Filled 2016-12-02: qty 1

## 2016-12-02 MED ORDER — METOCLOPRAMIDE HCL 5 MG/ML IJ SOLN
10.0000 mg | Freq: Once | INTRAMUSCULAR | Status: AC
  Administered 2016-12-02: 10 mg via INTRAVENOUS
  Filled 2016-12-02: qty 2

## 2016-12-02 NOTE — ED Provider Notes (Signed)
[facility]   CSN: [id number] Arrival date & time: 12/02/16  [id number]     History   Chief Complaint Chief Complaint  Patient presents with  . Headache    HPI [name] is a 45 y.o. female.PMH DM, HLD, here with headache.  It has been going no for the past 2-3 days.  Husband states She has been under a lot of stress recently. She is taking Aleve without any relief of her pain. She has not been sleeping well. She's been under stress for the past 3 months has had poor sleeping at that time. She states that she's had a headache and nausea all day long. This headache has been gradual in nature. It is in the front of her head. She started to have left hand numbness as well. She denies numbness or weakness anywhere else in her body. She has no other neurological symptoms. There are no further complaints.  10 Systems reviewed and are negative for acute change except as noted in the HPI.  HPI  Past Medical History:  Diagnosis Date  . Abdominal pain   . Blood in urine   . Blurred vision    when feeling very fatigued   . Chills   . Diabetes mellitus   . Dizziness   . Fatigue    loss of sleep  . Generalized headaches   . Hyperlipidemia   . Kidney stones   . Liver disease   . Non-alcoholic fatty liver disease   . Ovarian cyst   . Poor appetite   . Recurrent UTI   . Thrombophlebitis   . Wears glasses   . Weight decrease     Patient Active Problem List   Diagnosis Date Noted  . Hoarseness 06/14/2013  . Paresthesia 06/14/2013  . Syncope 05/09/2013  . Recurrent kidney stones 05/09/2013  . Tension headache 05/09/2013  . Lumbar paraspinal muscle spasm 02/06/2013  . Sinusitis 12/09/2012  . Pericardial effusion 11/29/2012  . Fatigue 11/29/2012  . Non-cardiac chest pain 11/29/2012  . Physical exam, annual 09/22/2012  . Hypokalemia 01/14/2012  . 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 UTI (lower urinary tract infection) 01/06/2012  . Renal insufficiency  01/06/2012  . Hyperlipidemia 12/16/2011  . Hypotension 12/16/2011  . Cold sore 12/16/2011  . Depression 08/19/2011  . Pelvic pain in female 06/24/2011  . Dysuria 06/11/2011  . Diabetes mellitus (HCC) 06/01/2011  . S/P gastric bypass 06/01/2011  . Non-alcoholic fatty liver disease 06/01/2011    Past Surgical History:  Procedure Laterality Date  . ABDOMINAL HYSTERECTOMY  2008   for polycystic ovaries  . APPENDECTOMY    . BILATERAL SALPINGOOPHORECTOMY  2012   Dr Tenny Crawoss  . CHOLECYSTECTOMY  2005  . COLONOSCOPY     negative, done for pain  . CYSTOSTOMY W/ BLADDER BIOPSY    . EXPLORATORY LAPAROTOMY  09/01/11  . GASTRIC BYPASS  10/2010  . kidney stones    . LAPAROSCOPY  10/19/2011   Procedure: LAPAROSCOPY OPERATIVE;  Surgeon: Miguel AschoffAllan Ross;  Location: [facility];  Service: Gynecology;  Laterality: N/A;  Operative Laparoscopy with Lysis Of Adhesions  . LAPAROTOMY  10/19/2011   Procedure: LAPAROTOMY;  Surgeon: Miguel AschoffAllan Ross;  Location: [facility];  Service: Gynecology;  Laterality: Bilateral;  Laparotomy With Bilateral Oophorectomy  . TUBAL LIGATION     x3  . UPPER GASTROINTESTINAL ENDOSCOPY     to assess varices    OB History    No data available  Home Medications    Prior to Admission medications   Medication Sig Start Date End Date Taking? Authorizing Provider  allopurinol (ZYLOPRIM) 100 MG tablet Take 100 mg by mouth daily.    Historical Provider, MD  ALPRAZolam Prudy Feeler(XANAX) 0.5 MG tablet TAKE 1 TABLET BY MOUTH 3 TIMES A DAY 08/23/13   Sheliah HatchKatherine E Tabori, MD  estradiol (ESTRACE) 1 MG tablet Take 1 mg by mouth daily.    Historical Provider, MD  pantoprazole (PROTONIX) 40 MG tablet Take 40 mg by mouth 2 (two) times daily.  06/24/13 06/24/14  Historical Provider, MD  potassium chloride (K-DUR) 10 MEQ tablet Take 10 mEq by mouth 2 (two) times daily.    Historical Provider, MD  promethazine (PHENERGAN) 25 MG tablet Take 1 tablet (25 mg total) by mouth every 6 (six) hours as needed for nausea or  vomiting. 10/01/15   Tilden FossaElizabeth Rees, MD  rosuvastatin (CRESTOR) 20 MG tablet Take 1 tablet (20 mg total) by mouth daily. 10/11/12 10/11/13  Sheliah HatchKatherine E Tabori, MD  zolpidem (AMBIEN CR) 12.5 MG CR tablet Take 1 tablet (12.5 mg total) by mouth at bedtime as needed for sleep. 09/04/13   Sheliah HatchKatherine E Tabori, MD    Family History Family History  Problem Relation Age of Onset  . Lung cancer Mother   . Cancer Mother     lung  . Hyperlipidemia Father   . Heart disease Father   . Stroke Father   . Hypertension Father   . Diabetes Father   . Hypertension Brother   . Other Brother     suicide    Social History Social History  Substance Use Topics  . Smoking status: Never Smoker  . Smokeless tobacco: Never Used  . Alcohol use No     Comment: never     Allergies   Patient has no known allergies.   Review of Systems Review of Systems   Physical Exam Updated Vital Signs BP 138/98   Pulse 107   Temp 98.4 F (36.9 C) (Oral)   Resp 18   Ht 5\' 4"  (1.626 m)   Wt 198 lb (89.8 kg)   SpO2 94%   BMI 33.99 kg/m   Physical Exam  Constitutional: She is oriented to person, place, and time. She appears well-developed and well-nourished. No distress.  HENT:  Head: Normocephalic and atraumatic.  Nose: Nose normal.  Mouth/Throat: Oropharynx is clear and moist. No oropharyngeal exudate.  Eyes: Conjunctivae and EOM are normal. Pupils are equal, round, and reactive to light. No scleral icterus.  Neck: Normal range of motion. Neck supple. No JVD present. No tracheal deviation present. No thyromegaly present.  Cardiovascular: Normal rate, regular rhythm and normal heart sounds.  Exam reveals no gallop and no friction rub.   No murmur heard. Pulmonary/Chest: Effort normal and breath sounds normal. No respiratory distress. She has no wheezes. She exhibits no tenderness.  Abdominal: Soft. Bowel sounds are normal. She exhibits no distension and no mass. There is no tenderness. There is no rebound  and no guarding.  Musculoskeletal: Normal range of motion. She exhibits no edema or tenderness.  Lymphadenopathy:    She has no cervical adenopathy.  Neurological: She is alert and oriented to person, place, and time. No cranial nerve deficit. She exhibits normal muscle tone.  Normal strength and sensation in all extremities.  Normal cerebellar testing.  Skin: Skin is warm and dry. No rash noted. No erythema. No pallor.  Nursing note and vitals reviewed.    ED Treatments /  Results  Labs (all labs ordered are listed, but only abnormal results are displayed) Labs Reviewed - No data to display  EKG  EKG Interpretation None       Radiology No results found.  Procedures Procedures (including critical care time)  Medications Ordered in ED Medications  ketorolac (TORADOL) 30 MG/ML injection 30 mg (not administered)  metoCLOPramide (REGLAN) injection 10 mg (not administered)  diphenhydrAMINE (BENADRYL) injection 50 mg (not administered)  sodium chloride 0.9 % bolus 1,000 mL (not administered)     Initial Impression / Assessment and Plan / ED Course  I have reviewed the triage vital signs and the nursing notes.  Pertinent labs & imaging results that were available during my care of the patient were reviewed by me and considered in my medical decision making (see chart for details).  Clinical Course    Patient presents emergency department for headache. History is not consistent with an emergent intracranial abnormality. Headache was gradual in onset and she has no neurological symptoms. We'll treat conservatively for complex migraine. She was given Toradol, Reglan, Benadryl as well as IV fluids. We'll continue to closely monitor.  6:49 AM Patient states headache has resolved as well as numbness.  She is finishing fluids, will continue to let her rest and DC in the next or so.  Reglan rx at home as needed for headache.  PCP fu regarding her stress and lack of sleep.   Advised on benadryl at home to help with sleep.  Final Clinical Impressions(s) / ED Diagnoses   Final diagnoses:  Other headache syndrome    New Prescriptions New Prescriptions   No medications on file     Tomasita Crumble, MD 12/02/16 [phone]

## 2016-12-02 NOTE — ED Triage Notes (Signed)
Pt ambulatory to treatment room. States onset of headache and nasuea since yesterday. Woke up this morning around 0230 and started to have left hand numbness. Husband reports "3 episodes" of slurred speech over the past week and that the patient has been under a significant amount of stress. She took aleve for the headache and denies hx of migraines.

## 2017-07-01 ENCOUNTER — Encounter (HOSPITAL_COMMUNITY): Payer: Self-pay

## 2017-11-14 ENCOUNTER — Inpatient Hospital Stay (HOSPITAL_COMMUNITY)
Admission: AD | Admit: 2017-11-14 | Discharge: 2017-11-17 | DRG: 683 | Disposition: A | Discharge status: Home / Self Care | Payer: 59 | Source: Other Acute Inpatient Hospital | Attending: Family Medicine | Admitting: Family Medicine

## 2017-11-14 DIAGNOSIS — E872 Acidosis, unspecified: Secondary | ICD-10-CM

## 2017-11-14 DIAGNOSIS — N179 Acute kidney failure, unspecified: Secondary | ICD-10-CM

## 2017-11-14 DIAGNOSIS — E538 Deficiency of other specified B group vitamins: Secondary | ICD-10-CM | POA: Diagnosis present

## 2017-11-14 DIAGNOSIS — E785 Hyperlipidemia, unspecified: Secondary | ICD-10-CM | POA: Diagnosis not present

## 2017-11-14 DIAGNOSIS — F329 Major depressive disorder, single episode, unspecified: Secondary | ICD-10-CM | POA: Diagnosis present

## 2017-11-14 DIAGNOSIS — N19 Unspecified kidney failure: Secondary | ICD-10-CM | POA: Diagnosis present

## 2017-11-14 DIAGNOSIS — Z801 Family history of malignant neoplasm of trachea, bronchus and lung: Secondary | ICD-10-CM

## 2017-11-14 DIAGNOSIS — Z8349 Family history of other endocrine, nutritional and metabolic diseases: Secondary | ICD-10-CM

## 2017-11-14 DIAGNOSIS — Z87442 Personal history of urinary calculi: Secondary | ICD-10-CM

## 2017-11-14 DIAGNOSIS — F419 Anxiety disorder, unspecified: Secondary | ICD-10-CM | POA: Diagnosis not present

## 2017-11-14 DIAGNOSIS — K760 Fatty (change of) liver, not elsewhere classified: Secondary | ICD-10-CM | POA: Diagnosis present

## 2017-11-14 DIAGNOSIS — Z9049 Acquired absence of other specified parts of digestive tract: Secondary | ICD-10-CM | POA: Diagnosis not present

## 2017-11-14 DIAGNOSIS — E860 Dehydration: Secondary | ICD-10-CM | POA: Diagnosis not present

## 2017-11-14 DIAGNOSIS — Z8249 Family history of ischemic heart disease and other diseases of the circulatory system: Secondary | ICD-10-CM | POA: Diagnosis not present

## 2017-11-14 DIAGNOSIS — D696 Thrombocytopenia, unspecified: Secondary | ICD-10-CM | POA: Diagnosis not present

## 2017-11-14 DIAGNOSIS — Z823 Family history of stroke: Secondary | ICD-10-CM | POA: Diagnosis not present

## 2017-11-14 DIAGNOSIS — Z90710 Acquired absence of both cervix and uterus: Secondary | ICD-10-CM

## 2017-11-14 DIAGNOSIS — E875 Hyperkalemia: Secondary | ICD-10-CM

## 2017-11-14 DIAGNOSIS — E1122 Type 2 diabetes mellitus with diabetic chronic kidney disease: Secondary | ICD-10-CM | POA: Diagnosis not present

## 2017-11-14 DIAGNOSIS — K7581 Nonalcoholic steatohepatitis (NASH): Secondary | ICD-10-CM | POA: Diagnosis not present

## 2017-11-14 DIAGNOSIS — N183 Chronic kidney disease, stage 3 (moderate): Secondary | ICD-10-CM | POA: Diagnosis present

## 2017-11-14 DIAGNOSIS — K7460 Unspecified cirrhosis of liver: Secondary | ICD-10-CM | POA: Diagnosis present

## 2017-11-14 DIAGNOSIS — D638 Anemia in other chronic diseases classified elsewhere: Secondary | ICD-10-CM | POA: Diagnosis present

## 2017-11-14 DIAGNOSIS — Z9884 Bariatric surgery status: Secondary | ICD-10-CM | POA: Diagnosis not present

## 2017-11-14 DIAGNOSIS — Z833 Family history of diabetes mellitus: Secondary | ICD-10-CM | POA: Diagnosis not present

## 2017-11-14 DIAGNOSIS — E119 Type 2 diabetes mellitus without complications: Secondary | ICD-10-CM

## 2017-11-14 HISTORY — DX: Acidosis, unspecified: E87.20

## 2017-11-14 HISTORY — DX: Hyperkalemia: E87.5

## 2017-11-14 HISTORY — DX: Acidosis: E87.2

## 2017-11-14 HISTORY — DX: Acute kidney failure, unspecified: N17.9

## 2017-11-14 LAB — BASIC METABOLIC PANEL
Anion gap: 20 — ABNORMAL HIGH (ref 5–15)
BUN: 44 mg/dL — AB (ref 6–20)
CO2: 22 mmol/L (ref 22–32)
CREATININE: 6.85 mg/dL — AB (ref 0.44–1.00)
Calcium: 8.4 mg/dL — ABNORMAL LOW (ref 8.9–10.3)
Chloride: 91 mmol/L — ABNORMAL LOW (ref 101–111)
GFR calc Af Amer: 8 mL/min — ABNORMAL LOW (ref >60)
GFR, EST NON AFRICAN AMERICAN: 7 mL/min — AB (ref >60)
Glucose, Bld: 73 mg/dL (ref 65–99)
Potassium: 4.1 mmol/L (ref 3.5–5.1)
SODIUM: 133 mmol/L — AB (ref 135–145)

## 2017-11-14 LAB — CBC
HCT: 29.7 % — ABNORMAL LOW (ref 36.0–46.0)
Hemoglobin: 9.9 g/dL — ABNORMAL LOW (ref 12.0–15.0)
MCH: 28.5 pg (ref 26.0–34.0)
MCHC: 33.3 g/dL (ref 30.0–36.0)
MCV: 85.6 fL (ref 78.0–100.0)
PLATELETS: 131 10*3/uL — AB (ref 150–400)
RBC: 3.47 MIL/uL — ABNORMAL LOW (ref 3.87–5.11)
RDW: 13.4 % (ref 11.5–15.5)
WBC: 5.7 10*3/uL (ref 4.0–10.5)

## 2017-11-14 LAB — HEMOGLOBIN A1C
HEMOGLOBIN A1C: 5.3 % (ref 4.8–5.6)
Mean Plasma Glucose: 105.41 mg/dL

## 2017-11-14 LAB — LACTIC ACID, PLASMA: LACTIC ACID, VENOUS: 2.6 mmol/L — AB (ref 0.5–1.9)

## 2017-11-14 MED ORDER — SODIUM CHLORIDE 0.9% FLUSH
3.0000 mL | Freq: Two times a day (BID) | INTRAVENOUS | Status: DC
  Administered 2017-11-15 – 2017-11-17 (×3): 3 mL via INTRAVENOUS

## 2017-11-14 MED ORDER — TRAMADOL HCL 50 MG PO TABS
50.0000 mg | ORAL_TABLET | Freq: Four times a day (QID) | ORAL | Status: DC | PRN
  Administered 2017-11-14 – 2017-11-16 (×4): 50 mg via ORAL
  Filled 2017-11-14 (×4): qty 1

## 2017-11-14 MED ORDER — HEPARIN SODIUM (PORCINE) 5000 UNIT/ML IJ SOLN
5000.0000 [IU] | Freq: Three times a day (TID) | INTRAMUSCULAR | Status: DC
  Administered 2017-11-14 – 2017-11-17 (×8): 5000 [IU] via SUBCUTANEOUS
  Filled 2017-11-14 (×8): qty 1

## 2017-11-14 MED ORDER — ALPRAZOLAM 0.5 MG PO TABS
0.5000 mg | ORAL_TABLET | Freq: Three times a day (TID) | ORAL | Status: DC
  Administered 2017-11-14: 0.5 mg via ORAL
  Filled 2017-11-14: qty 1

## 2017-11-14 MED ORDER — SODIUM CHLORIDE 0.9 % IV SOLN
INTRAVENOUS | Status: DC
  Administered 2017-11-14: 750 mL/h via INTRAVENOUS
  Administered 2017-11-15 (×2): via INTRAVENOUS
  Administered 2017-11-15: 125 mL/h via INTRAVENOUS
  Administered 2017-11-15: 750 mL/h via INTRAVENOUS
  Administered 2017-11-16 – 2017-11-17 (×4): via INTRAVENOUS

## 2017-11-14 MED ORDER — ONDANSETRON HCL 4 MG PO TABS: 4.0000 mg | ORAL_TABLET | Freq: Four times a day (QID) | ORAL | Status: DC | PRN

## 2017-11-14 MED ORDER — ZOLPIDEM TARTRATE 5 MG PO TABS: 5.0000 mg | ORAL_TABLET | Freq: Every evening | ORAL | Status: DC | PRN

## 2017-11-14 MED ORDER — INSULIN ASPART 100 UNIT/ML ~~LOC~~ SOLN
0.0000 [IU] | Freq: Three times a day (TID) | SUBCUTANEOUS | Status: DC
  Administered 2017-11-15: 1 [IU] via SUBCUTANEOUS
  Administered 2017-11-15: 2 [IU] via SUBCUTANEOUS
  Administered 2017-11-16: 1 [IU] via SUBCUTANEOUS

## 2017-11-14 MED ORDER — SODIUM CHLORIDE 0.9 % IV BOLUS (SEPSIS)
1000.0000 mL | Freq: Once | INTRAVENOUS | Status: AC
  Administered 2017-11-14: 1000 mL via INTRAVENOUS

## 2017-11-14 MED ORDER — ONDANSETRON HCL 4 MG/2ML IJ SOLN
4.0000 mg | Freq: Four times a day (QID) | INTRAMUSCULAR | Status: DC | PRN
  Administered 2017-11-15: 4 mg via INTRAVENOUS
  Filled 2017-11-14: qty 2

## 2017-11-14 MED ORDER — SODIUM CHLORIDE 0.9 % IV BOLUS (SEPSIS)
3000.0000 mL | Freq: Once | INTRAVENOUS | Status: AC
  Administered 2017-11-14: 750 mL via INTRAVENOUS

## 2017-11-14 NOTE — Consult Note (Signed)
Renal Service Consult Note [facility]  Rip HarbourMichelle S Sottile 11/14/2017 Maree KrabbeSCHERTZ,Ayden Apodaca D Requesting Physician:  Dr Alvino Chapelhoi  Reason for Consult:  Acute renal failure HPI: The patient is a 46 y.o. year-old with hx of DM2, NASH/ cirrhosis [facility] due to hypoglycemia. Her labs showed creatinine of 7.  She was sent her for admission given lack of renal [facility]. Asked to see for AKI.    Pt has been sick w/ intermittent N/V for the past 1- 2 months.  No diarrhea.  Using ibuprofen for headaches , taking about 6- 10 per week, also tylenol. NO abd pain , no sob or CP.  No hx kidney disease. Hx multiple kidney stones and multiple procedures to remove.   PSH - gastric bypass Roux--en- Y in 2012, hysterecotmy, appy, cholecystectomy.   ROS  denies CP  no joint pain   no HA  no blurry vision  no rash  no diarrhea  no dysuria  no difficulty voiding  no change in urine color    Past Medical History  Past Medical History:  Diagnosis Date  . Abdominal pain   . Blood in urine   . Blurred vision    when feeling very fatigued   . Chills   . Diabetes mellitus   . Dizziness   . Fatigue    loss of sleep  . Generalized headaches   . Hyperlipidemia   . Kidney stones   . Liver disease   . Non-alcoholic fatty liver disease   . Ovarian cyst   . Poor appetite   . Recurrent UTI   . Thrombophlebitis   . Wears glasses   . Weight decrease    Past Surgical History  Past Surgical History:  Procedure Laterality Date  . ABDOMINAL HYSTERECTOMY  2008   for polycystic ovaries  . APPENDECTOMY    . BILATERAL SALPINGOOPHORECTOMY  2012   Dr Tenny Crawoss  . CHOLECYSTECTOMY  2005  . COLONOSCOPY     negative, done for pain  . CYSTOSTOMY W/ BLADDER BIOPSY    . EXPLORATORY LAPAROTOMY  09/01/11  . GASTRIC BYPASS  10/2010  . kidney stones    . LAPAROSCOPY OPERATIVE N/A 10/19/2011   [facility]oss[facility] Allan[facility]  . LAPAROTOMY Bilateral 10/19/2011   Performed  [facility]oss, Allan[facility]  . TUBAL LIGATION     x3  . UPPER GASTROINTESTINAL ENDOSCOPY     to assess varices   Family History  Family History  Problem Relation Age of Onset  . Lung cancer Mother   . Cancer Mother        lung  . Hyperlipidemia Father   . Heart disease Father   . Stroke Father   . Hypertension Father   . Diabetes Father   . Hypertension Brother   . Other Brother        suicide   Social History  reports that  has never smoked. she has never used smokeless tobacco. She reports that she does not drink alcohol or use drugs. Allergies No Known Allergies Home medications Prior to Admission medications   Medication Sig Start Date End Date Taking? Authorizing Provider  allopurinol (ZYLOPRIM) 100 MG tablet Take 100 mg by mouth daily.   Yes [provider]  ALPRAZolam (XANAX) 0.5 MG tablet TAKE 1 TABLET BY MOUTH 3 TIMES A DAY Patient taking differently: Take 0.5 mg by mouth three times a day 08/23/13  Yes Sheliah Hatchabori, Katherine E, MD  atorvastatin (LIPITOR) 40 MG tablet Take 40 mg at bedtime by mouth. 08/23/17  Yes [provider]  buPROPion (WELLBUTRIN XL) 300 MG 24 hr tablet Take 300 mg daily by mouth. 09/18/17  Yes [provider]  citalopram (CELEXA) 20 MG tablet Take 20 mg daily by mouth. 04/12/17  Yes [provider]  dicyclomine (BENTYL) 20 MG tablet Take 20 mg 4 (four) times daily -  before meals and at bedtime by mouth.  09/20/17  Yes [provider]  estradiol (ESTRACE) 1 MG tablet Take 1 mg by mouth daily.   Yes [provider]  furosemide (LASIX) 20 MG tablet Take 20 mg daily by mouth. 10/18/17  Yes [provider]  metFORMIN (GLUCOPHAGE) 1000 MG tablet Take 1,000 mg 2 (two) times daily by mouth. 08/24/17  Yes [provider]  pantoprazole (PROTONIX) 40 MG tablet Take 40 mg daily before breakfast by mouth.  06/24/13 11/14/17 Yes [provider]  TRULICITY 0.75 MG/0.5ML SOPN Inject 0.75 mg  every Thursday into the skin. 09/01/17  Yes [provider]   Liver Function Tests No results for input(s): AST, ALT, ALKPHOS, BILITOT, PROT, ALBUMIN in the last 168 hours. No results for input(s): LIPASE, AMYLASE in the last 168 hours. CBC Recent Labs  Lab 11/14/17 1753  WBC 5.7  HGB 9.9*  HCT 29.7*  MCV 85.6  PLT 131*   Basic Metabolic Panel Recent Labs  Lab 11/14/17 1753  NA 133*  K 4.1  CL 91*  CO2 22  GLUCOSE 73  BUN 44*  CREATININE 6.85*  CALCIUM 8.4*   Iron/TIBC/Ferritin/ %Sat No results found for: IRON, TIBC, FERRITIN, IRONPCTSAT  Vitals:   11/14/17 1700  BP: 116/75  Pulse: (!) 101  Resp: 18  Temp: 98.3 F (36.8 C)  TempSrc: Oral  SpO2: 98%   Exam Gen looks tired, holding her head, alert and Ox 3 No rash, cyanosis or gangrene Sclera anicteric, throat clear and dry  No jvd or bruits, flat neck veins Chest clear bilat RRR no MRG Abd soft ntnd no mass or ascites +bs GU defer MS no joint effusions or deformity Ext no LE or UE edema / no wounds or ulcers Neuro is alert, Ox 3 , nf  Na 133  K 4.1  CO2 22 BUN 44  Cr 6.85  Ca 8.4   Lactic 2.6 Wbc 5k  Hb 9.9 plt 131    Meds at home: -lipitor/ allopurinol/ estrace/ PPI - prn nsaids'/ lasix 20 qd - wellbutrin xl / celexa 20 qd/ xanax prn/ bentyl prn - metformin 1gm bid/ trulicity 0.75 sq weekly  Impression: 1. Acute renal failure - suspect sig dehydration + NSAID use at home main issue.  No acei/ aRB.  She is still sig dry , would recommend aggressive hydration and f/u labs daily.  Should recover.  Reportedly her baseline creat is not normal, around 1 - 1.5.  Told her to avoid nsaids w/ her history of cirrhosis and during any bout of dehydrating illness.  2. NASH/ cirrhosis - no complications per pt 3. DM2 - on metformin and dulaglutide at home.  Hold metformin.  4. Vol depletion - NS bolus x 3 liter, then 125 cc/ hr.  5. Hypoglycemia - per primary    Plan - as above.   Vinson Moselleob Jayd Cadieux  MD [facility] pager [phone]   11/14/2017, 8:12 PM

## 2017-11-14 NOTE — H&P (Signed)
History and Physical    [name] GYK:[id number] DOB: [birth year] DOA: 11/14/2017  PCP: System, Pcp Not In  Patient coming from: [facility] ED transfer, directly admitted  Chief Complaint: weakness   HPI: [name] is a 46 y.o. female with medical history significant of gastric bypass, DM type 2, NASH cirrhosis, HLD, anxiety/depression who presented to [facility] ED with chief complaint of generalized weakness for several weeks. She checked her blood sugar this morning with blood sugar 50, which improved with PO intake to 112. Had nausea, vomiting. Then blood sugar dropped again to the 50s. She states that she has been having vomiting episodes that are related to eating a heavy meal; no issues when her meal is small. No fevers, chills, chest pain, SOB, abdominal pain, diarrhea. She has also noticed decreased urination, only urinating once or twice daily and sometimes they have been dark.   [facility] ED Course: Labs showed WBC 8.5, Hgb 11.4, Plt 134, Na 134, K 5.3, Cl 88, Bicarb 15, anion gap 36, BUN 44, Cr 7.8, eGFR 6, glucose 81, serum osmol 268, AST/ALT 26/21, alk phos 56, troponin < 0.1, lipase 118, TSH 0.11 (low), free T4 0.96 (normal), INR 1.1. UA showed negative nitrite, negative leuk esterase, 5-10 WBC.   CXR showed heart size and mediastinal contours within normal limits, both lungs clear, no active disease.   CT urogram showed no renal or ureteral stone, no hydronephrosis. Changes of cirrhosis, no focal hepatic abnormality.  EKG normal sinus rhythm.   She was given 1L IVF, zofran.   She was transferred to [facility] due to AKI and possible nephrology consultation.   Review of Systems: As per HPI otherwise 10 point review of systems negative.   Past Medical History:  Diagnosis Date  . Abdominal pain   . Blood in urine   . Blurred vision    when feeling very fatigued   . Chills   . Diabetes mellitus   . Dizziness   . Fatigue    loss of sleep  . Generalized  headaches   . Hyperlipidemia   . Kidney stones   . Liver disease   . Non-alcoholic fatty liver disease   . Ovarian cyst   . Poor appetite   . Recurrent UTI   . Thrombophlebitis   . Wears glasses   . Weight decrease     Past Surgical History:  Procedure Laterality Date  . ABDOMINAL HYSTERECTOMY  2008   for polycystic ovaries  . APPENDECTOMY    . BILATERAL SALPINGOOPHORECTOMY  2012   Dr Harrington Challenger  . CHOLECYSTECTOMY  2005  . COLONOSCOPY     negative, done for pain  . CYSTOSTOMY W/ BLADDER BIOPSY    . EXPLORATORY LAPAROTOMY  09/01/11  . GASTRIC BYPASS  10/2010  . kidney stones    . LAPAROSCOPY OPERATIVE N/A 10/19/2011   Performed by Gus Height, MD at [facility] ORS  . LAPAROTOMY Bilateral 10/19/2011   Performed by Gus Height, MD at [facility] ORS  . TUBAL LIGATION     x3  . UPPER GASTROINTESTINAL ENDOSCOPY     to assess varices     reports that  has never smoked. she has never used smokeless tobacco. She reports that she does not drink alcohol or use drugs.  No Known Allergies  Family History  Problem Relation Age of Onset  . Lung cancer Mother   . Cancer Mother        lung  . Hyperlipidemia Father   .  Heart disease Father   . Stroke Father   . Hypertension Father   . Diabetes Father   . Hypertension Brother   . Other Brother        suicide    Prior to Admission medications   Medication Sig Start Date End Date Taking? Authorizing Provider  allopurinol (ZYLOPRIM) 100 MG tablet Take 100 mg by mouth daily.    [provider]  ALPRAZolam Duanne Moron) 0.5 MG tablet TAKE 1 TABLET BY MOUTH 3 TIMES A DAY 08/23/13   Midge Minium, MD  estradiol (ESTRACE) 1 MG tablet Take 1 mg by mouth daily.    [provider]  metoCLOPramide (REGLAN) 10 MG tablet Take 1 tablet (10 mg total) by mouth 2 (two) times daily as needed for nausea (nausea/headache). 12/02/16   Everlene Balls, MD  pantoprazole (PROTONIX) 40 MG tablet Take 40 mg by mouth 2 (two) times daily.  06/24/13 06/24/14   [provider]  potassium chloride (K-DUR) 10 MEQ tablet Take 10 mEq by mouth 2 (two) times daily.    [provider]  promethazine (PHENERGAN) 25 MG tablet Take 1 tablet (25 mg total) by mouth every 6 (six) hours as needed for nausea or vomiting. 10/01/15   Quintella Reichert, MD  rosuvastatin (CRESTOR) 20 MG tablet Take 1 tablet (20 mg total) by mouth daily. 10/11/12 10/11/13  Midge Minium, MD  zolpidem (AMBIEN CR) 12.5 MG CR tablet Take 1 tablet (12.5 mg total) by mouth at bedtime as needed for sleep. 09/04/13   Midge Minium, MD    Physical Exam: Vitals:   11/14/17 1700  BP: 116/75  Pulse: (!) 101  Resp: 18  Temp: 98.3 F (36.8 C)  TempSrc: Oral  SpO2: 98%    Constitutional: NAD, calm, comfortable Eyes: PERRL, lids and conjunctivae normal ENMT: Mucous membranes are mildly dry. Posterior pharynx clear of any exudate or lesions. Normal dentition.  Neck: normal, supple, no masses, no thyromegaly Respiratory: clear to auscultation bilaterally, no wheezing, no crackles. Normal respiratory effort. No accessory muscle use.  Cardiovascular: Regular rate and rhythm, no murmurs / rubs / gallops. No extremity edema. Abdomen: no tenderness, no masses palpated. No hepatosplenomegaly. Bowel sounds positive.  Musculoskeletal: no clubbing / cyanosis. No joint deformity upper and lower extremities. Good ROM, no contractures. Normal muscle tone.  Skin: no rashes, lesions, ulcers on exposed skin  Neurologic: CN 2-12 grossly intact. Nonfocal exam, speech is clear  Psychiatric: Normal judgment and insight. Alert and oriented x 3. Normal mood.   Labs on Admission: I have personally reviewed following labs and imaging studies  CBC: No results for input(s): WBC, NEUTROABS, HGB, HCT, MCV, PLT in the last 168 hours. Basic Metabolic Panel: No results for input(s): NA, K, CL, CO2, GLUCOSE, BUN, CREATININE, CALCIUM, MG, PHOS in the last 168 hours. GFR: CrCl cannot be calculated  (Patient's most recent lab result is older than the maximum 21 days allowed.). Liver Function Tests: No results for input(s): AST, ALT, ALKPHOS, BILITOT, PROT, ALBUMIN in the last 168 hours. No results for input(s): LIPASE, AMYLASE in the last 168 hours. No results for input(s): AMMONIA in the last 168 hours. Coagulation Profile: No results for input(s): INR, PROTIME in the last 168 hours. Cardiac Enzymes: No results for input(s): CKTOTAL, CKMB, CKMBINDEX, TROPONINI in the last 168 hours. BNP (last 3 results) No results for input(s): PROBNP in the last 8760 hours. HbA1C: No results for input(s): HGBA1C in the last 72 hours. CBG: No results for input(s):  GLUCAP in the last 168 hours. Lipid Profile: No results for input(s): CHOL, HDL, LDLCALC, TRIG, CHOLHDL, LDLDIRECT in the last 72 hours. Thyroid Function Tests: No results for input(s): TSH, T4TOTAL, FREET4, T3FREE, THYROIDAB in the last 72 hours. Anemia Panel: No results for input(s): VITAMINB12, FOLATE, FERRITIN, TIBC, IRON, RETICCTPCT in the last 72 hours. Urine analysis:    Component Value Date/Time   COLORURINE RED (A) 12/25/2015 0445   APPEARANCEUR CLOUDY (A) 12/25/2015 0445   LABSPEC 1.024 12/25/2015 0445   PHURINE 6.0 12/25/2015 0445   GLUCOSEU 100 (A) 12/25/2015 0445   HGBUR LARGE (A) 12/25/2015 0445   BILIRUBINUR SMALL (A) 12/25/2015 0445   BILIRUBINUR Neg 03/23/2012 1402   KETONESUR 15 (A) 12/25/2015 0445   PROTEINUR NEGATIVE 12/25/2015 0445   UROBILINOGEN 0.2 10/01/2015 1435   NITRITE NEGATIVE 12/25/2015 0445   LEUKOCYTESUR SMALL (A) 12/25/2015 0445   Sepsis Labs: !!!!!!!!!!!!!!!!!!!!!!!!!!!!!!!!!!!!!!!!!!!! @LABRCNTIP (procalcitonin:4,lacticidven:4) )No results found for this or any previous visit (from the past 240 hour(s)).   Radiological Exams on Admission: No results found.  EKG: Independently reviewed EKG from [facility] ED. Normal sinus rhythm.   Assessment/Plan Principal Problem:   AKI (acute kidney  injury) ([place]) Active Problems:   Diabetes mellitus ([place])   S/P gastric bypass   Non-alcoholic fatty liver disease   Metabolic acidosis   Hyperkalemia   Anxiety   AKI on CKD stage 3  -Baseline Cr from March 2018 was 1.13, GFR 59 -CT urogram showed no renal or ureteral stone, no hydronephrosis -Check FeNa, UA -Monitor I/Os  -Has been getting IVF since [place] ED -Repeat stat BMP now -Nephrology consult   Anion gap metabolic acidosis -Check lactic acid   Hyperkalemia -Repeat stat BMP now   DM type 2 with hypoglycemia  -Takes trulicity (GLP 1) and metformin at home. Hold.  -Check Ha1c  -Sensitive SSI   NASH cirrhosis -Follows with [facility]   Anxiety -Continue home xanax     DVT prophylaxis: subq hep Code Status: full  Family Communication: at bedside Disposition Plan: pending improvement Consults called: nephrology, spoke with Dr Jonnie Finner Admission status: Inpatient    Severity of Illness: The appropriate patient status for this patient is INPATIENT. Inpatient status is judged to be reasonable and necessary in order to provide the required intensity of service to ensure the patient's safety. The patient's presenting symptoms, physical exam findings, and initial radiographic and laboratory data in the context of their chronic comorbidities is felt to place them at high risk for further clinical deterioration. Furthermore, it is not anticipated that the patient will be medically stable for discharge from the hospital within 2 midnights of admission. The following factors support the patient status of inpatient.   " The patient's presenting symptoms include weakness. " The initial radiographic and laboratory data are worrisome because of Cr 7.8 " The chronic co-morbidities include DM, cirrhosis.  * I certify that at the point of admission it is my clinical judgment that the patient will require inpatient hospital care spanning beyond 2 midnights from the point of  admission due to high intensity of service, high risk for further deterioration and high frequency of surveillance required.*   Dessa Phi, DO [facility] [email or web address] Password [email or web address] 11/14/2017, 4:59 PM

## 2017-11-15 ENCOUNTER — Inpatient Hospital Stay (HOSPITAL_COMMUNITY): Payer: 59

## 2017-11-15 ENCOUNTER — Other Ambulatory Visit: Payer: Self-pay

## 2017-11-15 DIAGNOSIS — E1122 Type 2 diabetes mellitus with diabetic chronic kidney disease: Secondary | ICD-10-CM

## 2017-11-15 DIAGNOSIS — F419 Anxiety disorder, unspecified: Secondary | ICD-10-CM

## 2017-11-15 DIAGNOSIS — N183 Chronic kidney disease, stage 3 (moderate): Secondary | ICD-10-CM

## 2017-11-15 LAB — RETICULOCYTES
RBC.: 3.34 MIL/uL — ABNORMAL LOW (ref 3.87–5.11)
Retic Count, Absolute: 20 10*3/uL (ref 19.0–186.0)
Retic Ct Pct: 0.6 % (ref 0.4–3.1)

## 2017-11-15 LAB — URINALYSIS, ROUTINE W REFLEX MICROSCOPIC
Bilirubin Urine: NEGATIVE
Glucose, UA: NEGATIVE mg/dL
Hgb urine dipstick: NEGATIVE
Ketones, ur: NEGATIVE mg/dL
LEUKOCYTES UA: NEGATIVE
Nitrite: NEGATIVE
PROTEIN: NEGATIVE mg/dL
SPECIFIC GRAVITY, URINE: 1.008 (ref 1.005–1.030)
pH: 5 (ref 5.0–8.0)

## 2017-11-15 LAB — COMPREHENSIVE METABOLIC PANEL
ALT: 11 U/L — ABNORMAL LOW (ref 14–54)
ANION GAP: 9 (ref 5–15)
AST: 17 U/L (ref 15–41)
Albumin: 3 g/dL — ABNORMAL LOW (ref 3.5–5.0)
Alkaline Phosphatase: 43 U/L (ref 38–126)
BILIRUBIN TOTAL: 0.5 mg/dL (ref 0.3–1.2)
BUN: 36 mg/dL — ABNORMAL HIGH (ref 6–20)
CHLORIDE: 103 mmol/L (ref 101–111)
CO2: 24 mmol/L (ref 22–32)
Calcium: 7.3 mg/dL — ABNORMAL LOW (ref 8.9–10.3)
Creatinine, Ser: 5.21 mg/dL — ABNORMAL HIGH (ref 0.44–1.00)
GFR, EST AFRICAN AMERICAN: 11 mL/min — AB (ref >60)
GFR, EST NON AFRICAN AMERICAN: 9 mL/min — AB (ref >60)
Glucose, Bld: 109 mg/dL — ABNORMAL HIGH (ref 65–99)
POTASSIUM: 4 mmol/L (ref 3.5–5.1)
Sodium: 136 mmol/L (ref 135–145)
TOTAL PROTEIN: 4.8 g/dL — AB (ref 6.5–8.1)

## 2017-11-15 LAB — CBC
HEMATOCRIT: 26.1 % — AB (ref 36.0–46.0)
Hemoglobin: 8.7 g/dL — ABNORMAL LOW (ref 12.0–15.0)
MCH: 28.6 pg (ref 26.0–34.0)
MCHC: 33.3 g/dL (ref 30.0–36.0)
MCV: 85.9 fL (ref 78.0–100.0)
PLATELETS: 94 10*3/uL — AB (ref 150–400)
RBC: 3.04 MIL/uL — ABNORMAL LOW (ref 3.87–5.11)
RDW: 13.4 % (ref 11.5–15.5)
WBC: 3.2 10*3/uL — AB (ref 4.0–10.5)

## 2017-11-15 LAB — HIV ANTIBODY (ROUTINE TESTING W REFLEX): HIV SCREEN 4TH GENERATION: NONREACTIVE

## 2017-11-15 LAB — FOLATE: FOLATE: 8.6 ng/mL (ref >5.9)

## 2017-11-15 LAB — VITAMIN B12: VITAMIN B 12: 125 pg/mL — AB (ref 180–914)

## 2017-11-15 LAB — FERRITIN: Ferritin: 31 ng/mL (ref 11–307)

## 2017-11-15 LAB — SODIUM, URINE, RANDOM: SODIUM UR: 60 mmol/L

## 2017-11-15 LAB — IRON AND TIBC
Iron: 66 ug/dL (ref 28–170)
SATURATION RATIOS: 20 % (ref 10.4–31.8)
TIBC: 325 ug/dL (ref 250–450)
UIBC: 259 ug/dL

## 2017-11-15 LAB — CREATININE, URINE, RANDOM: CREATININE, URINE: 55.89 mg/dL

## 2017-11-15 LAB — GLUCOSE, CAPILLARY
GLUCOSE-CAPILLARY: 116 mg/dL — AB (ref 65–99)
GLUCOSE-CAPILLARY: 162 mg/dL — AB (ref 65–99)
GLUCOSE-CAPILLARY: 122 mg/dL — AB (ref 65–99)
GLUCOSE-CAPILLARY: 162 mg/dL — AB (ref 65–99)
Glucose-Capillary: 97 mg/dL (ref 65–99)

## 2017-11-15 MED ORDER — VITAMIN B-12 1000 MCG PO TABS
1000.0000 ug | ORAL_TABLET | Freq: Every day | ORAL | Status: DC
  Administered 2017-11-15: 1000 ug via ORAL
  Filled 2017-11-15: qty 1

## 2017-11-15 MED ORDER — ALPRAZOLAM 0.5 MG PO TABS
0.5000 mg | ORAL_TABLET | Freq: Three times a day (TID) | ORAL | Status: DC | PRN
  Administered 2017-11-15 – 2017-11-16 (×5): 0.5 mg via ORAL
  Filled 2017-11-15 (×5): qty 1

## 2017-11-15 MED ORDER — PREMIER PROTEIN SHAKE
11.0000 [oz_av] | ORAL | Status: DC
  Administered 2017-11-15 – 2017-11-16 (×2): 11 [oz_av] via ORAL
  Filled 2017-11-15 (×5): qty 325.31

## 2017-11-15 MED ORDER — GLUCERNA SHAKE PO LIQD
237.0000 mL | Freq: Two times a day (BID) | ORAL | Status: DC
  Administered 2017-11-15 – 2017-11-17 (×4): 237 mL via ORAL
  Filled 2017-11-15 (×7): qty 237

## 2017-11-15 MED ORDER — BUPROPION HCL ER (XL) 150 MG PO TB24
300.0000 mg | ORAL_TABLET | Freq: Every day | ORAL | Status: DC
  Administered 2017-11-15 – 2017-11-16 (×2): 300 mg via ORAL
  Filled 2017-11-15 (×2): qty 2

## 2017-11-15 MED ORDER — CITALOPRAM HYDROBROMIDE 20 MG PO TABS
20.0000 mg | ORAL_TABLET | Freq: Every day | ORAL | Status: DC
  Administered 2017-11-15 – 2017-11-16 (×2): 20 mg via ORAL
  Filled 2017-11-15 (×2): qty 1

## 2017-11-15 MED ORDER — ADULT MULTIVITAMIN W/MINERALS CH
1.0000 | ORAL_TABLET | Freq: Every day | ORAL | Status: DC
  Administered 2017-11-15 – 2017-11-17 (×3): 1 via ORAL
  Filled 2017-11-15 (×3): qty 1

## 2017-11-15 NOTE — Progress Notes (Addendum)
Initial Nutrition Assessment  DOCUMENTATION CODES:   Obesity unspecified  INTERVENTION:   **Recommend checking copper level** **Recommend supplementing thiamine 100 mg for three days** **Recommend supplementing calcium (TUMS) 1200-1500 mg daily**   Provide MVI daily  Glucerna Shake po BID, each supplement provides 220 kcal and 10 grams of protein  Provide Premier Protein Q24, each supplement provides 160kcal and 30g protein.   NUTRITION DIAGNOSIS:   Inadequate oral intake related to nausea, vomiting as evidenced by per patient/family report.  GOAL:   Patient will meet greater than or equal to 90% of their needs  MONITOR:   PO intake, Supplement acceptance, Labs, Weight trends  REASON FOR ASSESSMENT:   Malnutrition Screening Tool    ASSESSMENT:   Pt with PMH significant for gastric bypass 2011, DM, NASH cirrhosis, CKD III, and HLD. Presents this admission with complaints of decreased blood sugar and recurring vomiting upon eating. Admitted for AKI on CKD III with suspect severe dehydration.    Spoke with pt at bedside. Reports being scared to eat due to vomiting that comes after each meal. States when she eats larger meals that are carbohydrate heavy she starts to sweat and has a loose bowel movement minutes after. 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 Feeling does not occur when she consumes smaller meals. Per patient, this has been going on over the last year.   Pt has been non-compliant with her gastric bypass vitamins/minerals. States she typically takes 1 [employer] multi-vitamin per day and the Vitamin D her PCP prescribes her. Discussed proper supplementation s/p bypass surgery and that these should be taken life long. Provided pt with handout of requirements. Answered all question. Recommend pt take these this hospital stay, along with thiamine 100 mg x 3 days. Pt admits to taking 2 tylenol daily and Ibuprofen 4-6 times per week. Pt  aware of effects.  Pt reports losing 40 lb since January 2018. Records are limited in weight readings. [facility] and weighed 193 lb. Do not suspect any significant weight loss since then.   Nutrition-Focused physical exam completed. Pt shown to have brittle thin hair. Suspect this is from lack of protein in her diet. Recommend pt consume 60 g/day. Discussed options she could have post discharge. Pt complains of teeth breaking often. Her Dentist attributes this to increased vomiting.   Medications reviewed and include: SSI, NS @ 125 ml/hr Labs reviewed: Creatinine 5.21 (H) Vit B12 125 (L) correct calcium 7.7 (L)  NUTRITION - FOCUSED PHYSICAL EXAM:    Most Recent Value  Orbital Region  No depletion  Upper Arm Region  No depletion  Thoracic and Lumbar Region  No depletion  Buccal Region  No depletion  Temple Region  No depletion  Clavicle Bone Region  No depletion  Clavicle and Acromion Bone Region  No depletion  Scapular Bone Region  No depletion  Dorsal Hand  No depletion  Patellar Region  No depletion  Anterior Thigh Region  No depletion  Posterior Calf Region  No depletion  Edema (RD Assessment)  Mild  Hair  Reviewed [thining, pt reports fallin out]  Eyes  Reviewed  Mouth  Reviewed [reports teeth break off often]  Skin  Reviewed  Nails  Reviewed       Diet Order:  Diet Carb Modified Fluid consistency: Thin; Room service appropriate? Yes  EDUCATION NEEDS:   Education needs have been addressed  Skin:  Skin Assessment: Reviewed RN Assessment  Last BM:  11/15/17  Height:   Ht Readings from Last 1 Encounters:  11/15/17 5\' 4"  (1.626 m)    Weight:   Wt Readings from Last 1 Encounters:  11/15/17 197 lb 15.6 oz (89.8 kg)    Ideal Body Weight:  54.5 kg  BMI:  Body mass index is 33.98 kg/m.  Estimated Nutritional Needs:   Kcal:  1600-1800 kcal/day  Protein:  80-90 g/day  Fluid:  >1.6 L/day    Vanessa Kickarly Marlita Keil RD, LDN Clinical  Nutrition Pager # - [phone]

## 2017-11-15 NOTE — Progress Notes (Addendum)
[facility]                                                                              Patient Demographics  [name], is a 46 y.o. female, DOB - Nov 15, 1971, ZOX:[id number]  Admit date - 11/14/2017   Admitting Physician Pratik Hoover Brunette Shah, DO  Outpatient Primary MD for the patient is System, Pcp Not In  Outpatient specialists:   LOS - 1  days   Medical records reviewed and are as summarized below:    No chief complaint on file.      Brief summary   Patient is a 46 year old female with gastric bypass, diabetes type 2, Nash cirrhosis, hyperlipidemia, anxiety/depression [facility] ED with generalized weakness for several weeks, hypoglycemia with CBG 50 on the day of admission.  Patient also reported vomiting episodes, decreased urination only urinated twice daily. Labs showed hemoglobin 11.4, potassium 5.3, Cl 88, bicarb 15, BUN 44, creatinine 7.8, anion gap of 36.    Assessment & Plan    Principal Problem:   AKI (acute kidney injury) (HCC) on chronic kidney disease stage III: Creatinine 7.8 at the time of admission -Possibly due to dehydration/vomiting, Lasix, NSAID use -Baseline creatinine from 3/18 was 1.1 -Improving with IV fluids hydration -Creatinine 5.2 today, will check renal ultrasound  Active Problems:   Diabetes mellitus (HCC) with hypoglycemia -Hold metformin, placed on sliding scale insulin, will check hemoglobin A1c    S/P gastric bypass -Now admitted with volume depletion, follow closely, gentle hydration    Non-alcoholic fatty liver disease -Will avoid NSAIDs given her history of cirrhosis    Metabolic acidosis with lactic acidosis: -Likely due to profound dehydration and metformin    Hyperkalemia -Resolved  Anemia -Possibly anemia of chronic disease, will check anemia panel    Anxiety -Continue Xanax as needed  Thrombocytopenia -Trending down today, follow closely   Code Status: Full CODE STATUS DVT  Prophylaxis: Heparin subcu Family Communication: Discussed in detail with the patient, all imaging results, lab results explained to the patient and husband   Disposition Plan:   Time Spent in minutes 35 minutes  Procedures:    Consultants:   Nephrology  Antimicrobials:      Medications  Scheduled Meds: . heparin  5,000 Units Subcutaneous Q8H  . insulin aspart  0-9 Units Subcutaneous TID WC  . sodium chloride flush  3 mL Intravenous Q12H   Continuous Infusions: . sodium chloride 125 mL/hr (11/15/17 0250)   PRN Meds:.ALPRAZolam, ondansetron **OR** ondansetron (ZOFRAN) IV, traMADol   Antibiotics   Anti-infectives (From admission, onward)   None        Subjective:   [name] was seen and examined today.  No vomiting today, no fevers or chills, feeling slightly better today. Patient denies dizziness, chest pain, shortness of breath, abdominal pain, new weakness, numbess, tingling. No acute events overnight.    Objective:   Vitals:   11/14/17 2121 11/15/17 0509 11/15/17 0800 11/15/17 0829  BP: 103/62 (!) 94/52 126/72 129/74  Pulse: 95 77 80 85  Resp: 18 18 18 18   Temp: 98 F (36.7 C) 97.9 F (36.6 C) 98.1 F (36.7  C) 98.1 F (36.7 C)  TempSrc: Oral Oral Oral Oral  SpO2: 98% 95% 98% 100%    Intake/Output Summary (Last 24 hours) at 11/15/2017 0855 Last data filed at 11/15/2017 0509 Gross per 24 hour  Intake 4120 ml  Output 1200 ml  Net 2920 ml     Wt Readings from Last 3 Encounters:  12/02/16 89.8 kg (198 lb)  12/25/15 103 kg (227 lb)  10/01/15 101.2 kg (223 lb)     Exam  General: Alert and oriented x 3, NAD  Eyes: PERRLA, EOMI, Anicteric Sclera,  HEENT:  Atraumatic, normocephalic, normal oropharynx  Cardiovascular: S1 S2 auscultated, no rubs, murmurs or gallops. Regular rate and rhythm.  Respiratory: Clear to auscultation bilaterally, no wheezing, rales or rhonchi  Gastrointestinal: Soft, nontender, nondistended, + bowel  sounds  Ext: no pedal edema bilaterally  Neuro: AAOx3, Cr N's II- XII. Strength 5/5 upper and lower extremities bilaterally, speech clear  Musculoskeletal: No digital cyanosis, clubbing  Skin: No rashes  Psych: Normal affect and demeanor, alert and oriented x3    Data Reviewed:  I have personally reviewed following labs and imaging studies  Micro Results No results found for this or any previous visit (from the past 240 hour(s)).  Radiology Reports No results found.  Lab Data:  CBC: Recent Labs  Lab 11/14/17 1753 11/15/17 0435  WBC 5.7 3.2*  HGB 9.9* 8.7*  HCT 29.7* 26.1*  MCV 85.6 85.9  PLT 131* 94*   Basic Metabolic Panel: Recent Labs  Lab 11/14/17 1753 11/15/17 0435  NA 133* 136  K 4.1 4.0  CL 91* 103  CO2 22 24  GLUCOSE 73 109*  BUN 44* 36*  CREATININE 6.85* 5.21*  CALCIUM 8.4* 7.3*   GFR: CrCl cannot be calculated (Unknown ideal weight.). Liver Function Tests: Recent Labs  Lab 11/15/17 0435  AST 17  ALT 11*  ALKPHOS 43  BILITOT 0.5  PROT 4.8*  ALBUMIN 3.0*   No results for input(s): LIPASE, AMYLASE in the last 168 hours. No results for input(s): AMMONIA in the last 168 hours. Coagulation Profile: No results for input(s): INR, PROTIME in the last 168 hours. Cardiac Enzymes: No results for input(s): CKTOTAL, CKMB, CKMBINDEX, TROPONINI in the last 168 hours. BNP (last 3 results) No results for input(s): PROBNP in the last 8760 hours. HbA1C: Recent Labs    11/14/17 1753  HGBA1C 5.3   CBG: Recent Labs  Lab 11/14/17 2115 11/15/17 0752  GLUCAP 116* 97   Lipid Profile: No results for input(s): CHOL, HDL, LDLCALC, TRIG, CHOLHDL, LDLDIRECT in the last 72 hours. Thyroid Function Tests: No results for input(s): TSH, T4TOTAL, FREET4, T3FREE, THYROIDAB in the last 72 hours. Anemia Panel: No results for input(s): VITAMINB12, FOLATE, FERRITIN, TIBC, IRON, RETICCTPCT in the last 72 hours. Urine analysis:    Component Value Date/Time    COLORURINE STRAW (A) 11/15/2017 0510   APPEARANCEUR CLEAR 11/15/2017 0510   LABSPEC 1.008 11/15/2017 0510   PHURINE 5.0 11/15/2017 0510   GLUCOSEU NEGATIVE 11/15/2017 0510   HGBUR NEGATIVE 11/15/2017 0510   BILIRUBINUR NEGATIVE 11/15/2017 0510   BILIRUBINUR Neg 03/23/2012 1402   KETONESUR NEGATIVE 11/15/2017 0510   PROTEINUR NEGATIVE 11/15/2017 0510   UROBILINOGEN 0.2 10/01/2015 1435   NITRITE NEGATIVE 11/15/2017 0510   LEUKOCYTESUR NEGATIVE 11/15/2017 0510     Klarisa Barman M.D. [facility] 11/15/2017, 8:55 AM  Pager: [phone] Between 7am to 7pm - call Pager - [phone]  After 7pm go to [email or web address] - password [email or web address]  Call  night coverage person covering after 7pm

## 2017-11-15 NOTE — Progress Notes (Addendum)
[facility] Rounding Note  Subjective/Interval History:  Feeling some better Making urine Dramatic improvement in renal function Worried about being off her antidepressants (should be OK to restart - ordered)  Explained to her why no metformin  Objective Vital signs in last 24 hours: Vitals:   11/15/17 0509 11/15/17 0800 11/15/17 0829 11/15/17 1335  BP: (!) 94/52 126/72 129/74   Pulse: 77 80 85   Resp: 18 18 18    Temp: 97.9 F (36.6 C) 98.1 F (36.7 C) 98.1 F (36.7 C)   TempSrc: Oral Oral Oral   SpO2: 95% 98% 100%   Weight:    89.8 kg (197 lb 15.6 oz)  Height:    5\' 4"  (1.626 m)   Weight change:   Intake/Output Summary (Last 24 hours) at 11/15/2017 1651 Last data filed at 11/15/2017 0509 Gross per 24 hour  Intake 4120 ml  Output 1200 ml  Net 2920 ml   Physical Exam:  Blood pressure 129/74, pulse 85, temperature 98.1 F (36.7 C), temperature source Oral, resp. rate 18, height 5\' 4"  (1.626 m), weight 89.8 kg (197 lb 15.6 oz), SpO2 100 %.  Gen NAD Sclera anicteric No jvd or bruits, flat neck veins Chest clear bilat RRR no MRG Abd soft ntnd no mass or ascites +bs No edema Neuro is alert, Ox 3 , nf  Recent Labs  Lab 11/14/17 1753 11/15/17 0435  NA 133* 136  K 4.1 4.0  CL 91* 103  CO2 22 24  GLUCOSE 73 109*  BUN 44* 36*  CREATININE 6.85* 5.21*  CALCIUM 8.4* 7.3*    Recent Labs  Lab 11/15/17 0435  AST 17  ALT 11*  ALKPHOS 43  BILITOT 0.5  PROT 4.8*  ALBUMIN 3.0*    Recent Labs  Lab 11/14/17 1753 11/15/17 0435  WBC 5.7 3.2*  HGB 9.9* 8.7*  HCT 29.7* 26.1*  MCV 85.6 85.9  PLT 131* 94*    Recent Labs  Lab 11/14/17 2115 11/15/17 0752 11/15/17 1150  GLUCAP 116* 97 162*     Recent Labs  Lab 11/15/17 0945  IRON 66  TIBC 325  FERRITIN 31   Studies/Results: [facility]  Result Date: 11/15/2017 CLINICAL DATA:  Acute kidney injury. EXAM: RENAL / URINARY TRACT ULTRASOUND COMPLETE COMPARISON:  CT scan of November 14, 2017. FINDINGS: Right Kidney:  Length: 12.9 cm. Echogenicity within normal limits. No mass or hydronephrosis visualized. Left Kidney: Length: 13.9 cm. Echogenicity within normal limits. No mass or hydronephrosis visualized. Bladder: Appears normal for degree of bladder distention. IMPRESSION: Normal renal ultrasound. Electronically Signed   By: Lupita RaiderJames  Green Jr, M.D.   On: 11/15/2017 09:08   Medications: . sodium chloride 125 mL/hr at 11/15/17 1056   . feeding supplement (GLUCERNA SHAKE)  237 mL Oral BID BM  . heparin  5,000 Units Subcutaneous Q8H  . insulin aspart  0-9 Units Subcutaneous TID WC  . multivitamin with minerals  1 tablet Oral Daily  . protein supplement shake  11 oz Oral Q24H  . sodium chloride flush  3 mL Intravenous Q12H   . sodium chloride 125 mL/hr at 11/15/17 1056    Background: 46 y.o. year-old with hx of DM2, NASH/ cirrhosis [facility] due to hypoglycemia. Her labs showed creatinine of 7.  She was sent her for admission given [facility]. Pt has been sick w/ intermittent N/V for the past 1- 2 months.  No diarrhea.  Using ibuprofen for headaches, taking about 6- 10 per  week.  No hx kidney disease. Hx multiple kidney stones and multiple procedures to remove.  Asked to see for AKI. Normal renal ultrasound. Baseline creatinine 02/2017 1.1.   Assessment/Recommendations  1. Acute renal failure - volume depletion + NSAID use at home. Creatinine with big improvement from 7.8 down to 5.2 this AM w/IVF (NS at 125/hour). UOP recorded at 1200 1st 24 hours here. Should recover.  Reportedly her baseline creat is NOT normal, around 1 - 1.5.  2. NASH/ cirrhosis - no complications per pt 3. DM2 - on metformin and dulaglutide at home.  Holding metformin.  4. Vol depletion - NS bolus x 3 liter, then 125 cc/ hr.  5. Hypoglycemia - per primary 6. Anemia - Hb dropping w/hydration. Fe profile normal. Further evaluation per primary team 7. Depression - I have written for her bupropion  and citalopram (to save [facility] a call...)  Lynn Balynthia Peggie Hornak, MD [facility] [phone] pager 11/15/2017, 4:51 PM

## 2017-11-16 DIAGNOSIS — E872 Acidosis: Secondary | ICD-10-CM

## 2017-11-16 DIAGNOSIS — Z9884 Bariatric surgery status: Secondary | ICD-10-CM

## 2017-11-16 DIAGNOSIS — E875 Hyperkalemia: Secondary | ICD-10-CM

## 2017-11-16 DIAGNOSIS — K760 Fatty (change of) liver, not elsewhere classified: Secondary | ICD-10-CM

## 2017-11-16 LAB — CBC
HCT: 27.3 % — ABNORMAL LOW (ref 36.0–46.0)
Hemoglobin: 8.8 g/dL — ABNORMAL LOW (ref 12.0–15.0)
MCH: 28.2 pg (ref 26.0–34.0)
MCHC: 32.2 g/dL (ref 30.0–36.0)
MCV: 87.5 fL (ref 78.0–100.0)
PLATELETS: 101 10*3/uL — AB (ref 150–400)
RBC: 3.12 MIL/uL — ABNORMAL LOW (ref 3.87–5.11)
RDW: 14 % (ref 11.5–15.5)
WBC: 2.2 10*3/uL — AB (ref 4.0–10.5)

## 2017-11-16 LAB — BASIC METABOLIC PANEL
ANION GAP: 4 — AB (ref 5–15)
BUN: 20 mg/dL (ref 6–20)
CO2: 26 mmol/L (ref 22–32)
Calcium: 7.9 mg/dL — ABNORMAL LOW (ref 8.9–10.3)
Chloride: 110 mmol/L (ref 101–111)
Creatinine, Ser: 2.46 mg/dL — ABNORMAL HIGH (ref 0.44–1.00)
GFR calc Af Amer: 26 mL/min — ABNORMAL LOW (ref >60)
GFR, EST NON AFRICAN AMERICAN: 23 mL/min — AB (ref >60)
GLUCOSE: 107 mg/dL — AB (ref 65–99)
POTASSIUM: 3.7 mmol/L (ref 3.5–5.1)
Sodium: 140 mmol/L (ref 135–145)

## 2017-11-16 LAB — GLUCOSE, CAPILLARY
GLUCOSE-CAPILLARY: 104 mg/dL — AB (ref 65–99)
Glucose-Capillary: 126 mg/dL — ABNORMAL HIGH (ref 65–99)
Glucose-Capillary: 95 mg/dL (ref 65–99)
Glucose-Capillary: 78 mg/dL (ref 65–99)

## 2017-11-16 MED ORDER — VITAMIN B-12 1000 MCG PO TABS
1000.0000 ug | ORAL_TABLET | Freq: Every day | ORAL | Status: DC
  Administered 2017-11-17: 1000 ug via ORAL
  Filled 2017-11-16: qty 1

## 2017-11-16 MED ORDER — CYANOCOBALAMIN 1000 MCG/ML IJ SOLN
1000.0000 ug | Freq: Once | INTRAMUSCULAR | Status: AC
  Administered 2017-11-16: 1000 ug via INTRAMUSCULAR
  Filled 2017-11-16: qty 1

## 2017-11-16 NOTE — Progress Notes (Signed)
[facility]                                                                              Patient Demographics  [name], is a 46 y.o. female, DOB - 1971-08-18, ZOX:[id number]  Admit date - 11/14/2017   Admitting Physician Pratik Hoover Brunette Shah, DO  Outpatient Primary MD for the patient is System, Pcp Not In  Outpatient specialists:   LOS - 2  days   Medical records reviewed and are as summarized below:    No chief complaint on file.      Brief summary   Patient is a 46 year old female with gastric bypass, diabetes type 2, Nash cirrhosis, hyperlipidemia, anxiety/depression [facility] with generalized weakness for several weeks, hypoglycemia with CBG 50 on the day of admission.  Patient also reported vomiting episodes, decreased urination only urinated twice daily. Labs showed hemoglobin 11.4, potassium 5.3, Cl 88, bicarb 15, BUN 44, creatinine 7.8, anion gap of 36.    Assessment & Plan    Principal Problem:   AKI (acute kidney injury) (HCC) on chronic kidney disease stage III: Creatinine 7.8 at the time of admission -Possibly due to dehydration/vomiting, Lasix, NSAID use -Baseline creatinine from 3/18 was 1.1 -Improving with IV fluids hydration -Renal ultrasound negative for any obstruction/hydronephrosis -Creatinine improving, 2.46 today  Active Problems:   Diabetes mellitus (HCC) with hypoglycemia -Hold metformin -Continue sliding scale insulin, hemoglobin A1c 5.3    S/P gastric bypass -Now admitted with volume depletion, follow closely, gentle hydration  Vitamin B12 deficiency in the setting of gastric bypass -No neurological symptoms, placed on B12 IM today, start daily replacement    Non-alcoholic fatty liver disease -Will avoid NSAIDs given her history of cirrhosis    Metabolic acidosis with lactic acidosis: -Likely due to profound dehydration and metformin    Hyperkalemia -Resolved  Anemia -Possibly anemia of chronic  disease, will check anemia panel    Anxiety -Continue Xanax as needed  Thrombocytopenia -Improving, monitor counts   Code Status: Full CODE STATUS DVT Prophylaxis: Heparin subcu Family Communication: Discussed in detail with the patient, all imaging results, lab results explained to the patient and daughter   Disposition Plan: Possible DC home in a.m. if creatinine continues to improve  Time Spent in minutes 25 minutes  Procedures:  Renal ultrasound  Consultants:   Nephrology  Antimicrobials:      Medications  Scheduled Meds: . buPROPion  300 mg Oral QHS  . citalopram  20 mg Oral QHS  . feeding supplement (GLUCERNA SHAKE)  237 mL Oral BID BM  . heparin  5,000 Units Subcutaneous Q8H  . insulin aspart  0-9 Units Subcutaneous TID WC  . multivitamin with minerals  1 tablet Oral Daily  . protein supplement shake  11 oz Oral Q24H  . sodium chloride flush  3 mL Intravenous Q12H  . [START ON 11/17/2017] vitamin B-12  1,000 mcg Oral Daily   Continuous Infusions: . sodium chloride 125 mL/hr at 11/16/17 0908   PRN Meds:.ALPRAZolam, ondansetron **OR** ondansetron (ZOFRAN) IV, traMADol   Antibiotics   Anti-infectives (From admission, onward)   None  Subjective:   [name] was seen and examined today.  No fevers, denies any specific complaints, no dizziness.  No nausea vomiting or diarrhea.  Patient denies chest pain, shortness of breath, abdominal pain, new weakness, numbess, tingling. No acute events overnight.    Objective:   Vitals:   11/15/17 2102 11/15/17 2148 11/16/17 0424 11/16/17 0759  BP: (!) 96/49  (!) 98/56 108/65  Pulse: 73  78 77  Resp: 16  17 17   Temp: 98.3 F (36.8 C)  98.1 F (36.7 C) 98 F (36.7 C)  TempSrc:    Oral  SpO2: 97%  97%   Weight:  89 kg (196 lb 3.4 oz)    Height:        Intake/Output Summary (Last 24 hours) at 11/16/2017 1121 Last data filed at 11/16/2017 0947 Gross per 24 hour  Intake 3718 ml  Output 600 ml    Net 3118 ml     Wt Readings from Last 3 Encounters:  11/15/17 89 kg (196 lb 3.4 oz)  12/02/16 89.8 kg (198 lb)  12/25/15 103 kg (227 lb)     Exam   General: Alert and oriented x 3, NAD  Eyes:   HEENT:  Atraumatic, normocephalic  Cardiovascular: S1 S2 auscultated,RRR. No pedal edema b/l  Respiratory: CTAB  Gastrointestinal: Soft, nontender, nondistended, + bowel sounds  Ext: no pedal edema bilaterally  Neuro: no new deficits  Musculoskeletal: No digital cyanosis, clubbing  Skin: No rashes  Psych: Normal affect and demeanor, alert and oriented x3    Data Reviewed:  I have personally reviewed following labs and imaging studies  Micro Results No results found for this or any previous visit (from the past 240 hour(s)).  Radiology Reports [facility]  Result Date: 11/15/2017 CLINICAL DATA:  Acute kidney injury. EXAM: RENAL / URINARY TRACT ULTRASOUND COMPLETE COMPARISON:  CT scan of November 14, 2017. FINDINGS: Right Kidney: Length: 12.9 cm. Echogenicity within normal limits. No mass or hydronephrosis visualized. Left Kidney: Length: 13.9 cm. Echogenicity within normal limits. No mass or hydronephrosis visualized. Bladder: Appears normal for degree of bladder distention. IMPRESSION: Normal renal ultrasound. Electronically Signed   By: Lupita RaiderJames  Green Jr, M.D.   On: 11/15/2017 09:08    Lab Data:  CBC: Recent Labs  Lab 11/14/17 1753 11/15/17 0435 11/16/17 0549  WBC 5.7 3.2* 2.2*  HGB 9.9* 8.7* 8.8*  HCT 29.7* 26.1* 27.3*  MCV 85.6 85.9 87.5  PLT 131* 94* 101*   Basic Metabolic Panel: Recent Labs  Lab 11/14/17 1753 11/15/17 0435 11/16/17 0549  NA 133* 136 140  K 4.1 4.0 3.7  CL 91* 103 110  CO2 22 24 26   GLUCOSE 73 109* 107*  BUN 44* 36* 20  CREATININE 6.85* 5.21* 2.46*  CALCIUM 8.4* 7.3* 7.9*   GFR: Estimated Creatinine Clearance: 31.2 mL/min (A) (by C-G formula based on SCr of 2.46 mg/dL (H)). Liver Function Tests: Recent Labs  Lab 11/15/17 0435   AST 17  ALT 11*  ALKPHOS 43  BILITOT 0.5  PROT 4.8*  ALBUMIN 3.0*   No results for input(s): LIPASE, AMYLASE in the last 168 hours. No results for input(s): AMMONIA in the last 168 hours. Coagulation Profile: No results for input(s): INR, PROTIME in the last 168 hours. Cardiac Enzymes: No results for input(s): CKTOTAL, CKMB, CKMBINDEX, TROPONINI in the last 168 hours. BNP (last 3 results) No results for input(s): PROBNP in the last 8760 hours. HbA1C: Recent Labs    11/14/17 1753  HGBA1C 5.3  CBG: Recent Labs  Lab 11/15/17 0752 11/15/17 1150 11/15/17 1651 11/15/17 2139 11/16/17 0740  GLUCAP 97 162* 122* 162* 126*   Lipid Profile: No results for input(s): CHOL, HDL, LDLCALC, TRIG, CHOLHDL, LDLDIRECT in the last 72 hours. Thyroid Function Tests: No results for input(s): TSH, T4TOTAL, FREET4, T3FREE, THYROIDAB in the last 72 hours. Anemia Panel: Recent Labs    11/15/17 0945  VITAMINB12 125*  FOLATE 8.6  FERRITIN 31  TIBC 325  IRON 66  RETICCTPCT 0.6   Urine analysis:    Component Value Date/Time   COLORURINE STRAW (A) 11/15/2017 0510   APPEARANCEUR CLEAR 11/15/2017 0510   LABSPEC 1.008 11/15/2017 0510   PHURINE 5.0 11/15/2017 0510   GLUCOSEU NEGATIVE 11/15/2017 0510   HGBUR NEGATIVE 11/15/2017 0510   BILIRUBINUR NEGATIVE 11/15/2017 0510   BILIRUBINUR Neg 03/23/2012 1402   KETONESUR NEGATIVE 11/15/2017 0510   PROTEINUR NEGATIVE 11/15/2017 0510   UROBILINOGEN 0.2 10/01/2015 1435   NITRITE NEGATIVE 11/15/2017 0510   LEUKOCYTESUR NEGATIVE 11/15/2017 0510     Andi Layfield M.D. [facility] 11/16/2017, 11:21 AM  Pager: [phone] Between 7am to 7pm - call Pager - [phone]  After 7pm go to [email or web address] - password [email or web address]  Call night coverage person covering after 7pm

## 2017-11-16 NOTE — Progress Notes (Signed)
[facility] Rounding Note  Subjective/Interval History:  Dramatic improvement in renal function Still IVF at 125 Excellent UOP No issues overnight Objective Vital signs in last 24 hours: Vitals:   11/15/17 2102 11/15/17 2148 11/16/17 0424 11/16/17 0759  BP: (!) 96/49  (!) 98/56 108/65  Pulse: 73  78 77  Resp: 16  17 17   Temp: 98.3 F (36.8 C)  98.1 F (36.7 C) 98 F (36.7 C)  TempSrc:    Oral  SpO2: 97%  97%   Weight:  89 kg (196 lb 3.4 oz)    Height:       Weight change:   Intake/Output Summary (Last 24 hours) at 11/16/2017 1159 Last data filed at 11/16/2017 1123 Gross per 24 hour  Intake 3718 ml  Output 2400 ml  Net 1318 ml   Physical Exam:  Blood pressure 108/65, pulse 77, temperature 98 F (36.7 C), temperature source Oral, resp. rate 17, height 5\' 4"  (1.626 m), weight 89 kg (196 lb 3.4 oz), SpO2 97 %.  Gen NAD Sclera anicteric No jvd or bruits, flat neck veins Chest clear bilat RRR no MRG Abd soft ntnd no mass or ascites +bs No edema Neuro alert, oriented  Recent Labs  Lab 11/14/17 1753 11/15/17 0435 11/16/17 0549  NA 133* 136 140  K 4.1 4.0 3.7  CL 91* 103 110  CO2 22 24 26   GLUCOSE 73 109* 107*  BUN 44* 36* 20  CREATININE 6.85* 5.21* 2.46*  CALCIUM 8.4* 7.3* 7.9*    Recent Labs  Lab 11/15/17 0435  AST 17  ALT 11*  ALKPHOS 43  BILITOT 0.5  PROT 4.8*  ALBUMIN 3.0*    Recent Labs  Lab 11/14/17 1753 11/15/17 0435 11/16/17 0549  WBC 5.7 3.2* 2.2*  HGB 9.9* 8.7* 8.8*  HCT 29.7* 26.1* 27.3*  MCV 85.6 85.9 87.5  PLT 131* 94* 101*    Recent Labs  Lab 11/15/17 1150 11/15/17 1651 11/15/17 2139 11/16/17 0740 11/16/17 1147  GLUCAP 162* 122* 162* 126* 95     Recent Labs  Lab 11/15/17 0945  IRON 66  TIBC 325  FERRITIN 31   Studies/Results: [facility]  Result Date: 11/15/2017 CLINICAL DATA:  Acute kidney injury. EXAM: RENAL / URINARY TRACT ULTRASOUND COMPLETE COMPARISON:  CT scan of November 14, 2017. FINDINGS: Right Kidney: Length:  12.9 cm. Echogenicity within normal limits. No mass or hydronephrosis visualized. Left Kidney: Length: 13.9 cm. Echogenicity within normal limits. No mass or hydronephrosis visualized. Bladder: Appears normal for degree of bladder distention. IMPRESSION: Normal renal ultrasound. Electronically Signed   By: Lupita RaiderJames  Green Jr, M.D.   On: 11/15/2017 09:08   Medications: . sodium chloride 125 mL/hr at 11/16/17 0908   . buPROPion  300 mg Oral QHS  . citalopram  20 mg Oral QHS  . feeding supplement (GLUCERNA SHAKE)  237 mL Oral BID BM  . heparin  5,000 Units Subcutaneous Q8H  . insulin aspart  0-9 Units Subcutaneous TID WC  . multivitamin with minerals  1 tablet Oral Daily  . protein supplement shake  11 oz Oral Q24H  . sodium chloride flush  3 mL Intravenous Q12H  . [START ON 11/17/2017] vitamin B-12  1,000 mcg Oral Daily   . sodium chloride 125 mL/hr at 11/16/17 [id number]    Background: 46 y.o. year-old with hx of DM2, NASH/ cirrhosis [facility] due to hypoglycemia. Her labs showed creatinine of 7.  She was sent her for admission given lack of renal consultation  at [facility]. Pt has been sick w/ intermittent N/V for the past 1- 2 months.  No diarrhea.  Using ibuprofen for headaches, taking about 6- 10 per week.  No hx kidney disease. Hx multiple kidney stones and multiple procedures to remove.  Asked to see for AKI. Normal renal ultrasound. Baseline creatinine 02/2017 1.1.   Assessment/Recommendations  1. Acute renal failure - volume depletion + NSAID use at home. Dramatic improvement in renal function since admission and anticipate she will recover back to her baseline (1.1). No electrolyte issues so far during recovery phase.  2. NASH/ cirrhosis - no complications per pt 3. DM2 - on metformin and dulaglutide at home.  Holding metformin.  4. Vol depletion - NS bolus x 3 liter, then 125 cc/ hr.  5. Hypoglycemia - per primary 6. Anemia - Hb dropping w/hydration. Fe profile normal. Further  evaluation per primary team 7. Depression - bupropion and citalopram (to save [facility] a call...)  Renal disposition - I think OK to reduce IVF today to 75, stop tomorrow. Follow daily labs. Should return to baseline. Renal sill sign off at this time  Camille Balynthia Pricella Gaugh, MD [facility] [phone] pager 11/16/2017, 11:59 AM

## 2017-11-17 LAB — RENAL FUNCTION PANEL
ALBUMIN: 3.2 g/dL — AB (ref 3.5–5.0)
ANION GAP: 6 (ref 5–15)
BUN: 15 mg/dL (ref 6–20)
CALCIUM: 8.3 mg/dL — AB (ref 8.9–10.3)
CO2: 26 mmol/L (ref 22–32)
CREATININE: 1.78 mg/dL — AB (ref 0.44–1.00)
Chloride: 107 mmol/L (ref 101–111)
GFR calc non Af Amer: 33 mL/min — ABNORMAL LOW (ref >60)
GFR, EST AFRICAN AMERICAN: 39 mL/min — AB (ref >60)
GLUCOSE: 79 mg/dL (ref 65–99)
PHOSPHORUS: 2.6 mg/dL (ref 2.5–4.6)
Potassium: 4.3 mmol/L (ref 3.5–5.1)
SODIUM: 139 mmol/L (ref 135–145)

## 2017-11-17 LAB — GLUCOSE, CAPILLARY: Glucose-Capillary: 113 mg/dL — ABNORMAL HIGH (ref 65–99)

## 2017-11-17 MED ORDER — PANTOPRAZOLE SODIUM 40 MG PO TBEC: 40.0000 mg | DELAYED_RELEASE_TABLET | Freq: Every day | ORAL | 0 refills | Status: DC

## 2017-11-17 MED ORDER — CYANOCOBALAMIN 1000 MCG/ML IJ SOLN: 1000.0000 ug | INTRAMUSCULAR | 0 refills | Status: AC

## 2017-11-17 MED ORDER — METFORMIN HCL 1000 MG PO TABS: 1000.0000 mg | ORAL_TABLET | Freq: Two times a day (BID) | ORAL | 1 refills | Status: DC

## 2017-11-17 MED ORDER — CYANOCOBALAMIN 1000 MCG PO TABS: 1000.0000 ug | ORAL_TABLET | Freq: Every day | ORAL | 0 refills | Status: AC

## 2017-11-17 NOTE — Discharge Summary (Signed)
Physician Discharge Summary  [name]  ZOX:[id number]  DOB: 01/28/1971  DOA: 11/14/2017 PCP: System, Pcp Not In  Admit date: 11/14/2017 Discharge date: 11/17/2017  Admitted From: Home  Disposition:  Home   Recommendations for Outpatient Follow-up:  1. Follow up with PCP in 1 week  2. Please obtain BMP/CBC in one week to monitor Hgb and renal function  3. Holding lasix for now  4. Check B12 levels in 6 weeks   Discharge Condition: Stable   CODE STATUS: Full code  Diet recommendation: Heart Healthy   Brief/Interim Summary: For full details see H&P/Progress note but in brief, [name]  is a 46 year old female with gastric bypass, diabetes type 2, Nash cirrhosis, hyperlipidemia, anxiety/depression presented to [facility] ED with generalized weakness for several weeks, hypoglycemia with CBG 50 on the day of admission.  Patient also reported vomiting episodes, decreased urination only urinated twice daily. Labs showed hemoglobin 11.4, potassium 5.3, Cl 88, bicarb 15, BUN 44, creatinine 7.8, anion gap of 36. Patient was transferred to [facility] for renal evaluation. AKI and metabolic disarray felt to be due to severe dehydration/NSAID's/Lasix. Nephrology was consulted, recommended IVF and monitoring. Patient Cr subsequently improved, with hydration. Nausea and vomiting resolved, she was found to be B12 deficient with anemia which hemodilution contributed. Patient clinically improved and was discharge home to follow up with PCP.   Subjective: Patient seen, doing well, have no complaints. No acute events overnight. Remains afebrile.   Discharge Diagnoses/Hospital Course:  AKI on chronic kidney disease stage III:  Creatinine 7.8 at the time of admission Felt to be due to dehydration/vomiting, Lasix, NSAID use Hold Lasix until seen by PCP  Baseline creatinine from 3/18 was 1.1 Renal ultrasound negative for any obstruction/hydronephrosis Creatinine upon discharge  1.74 Check BMP in 1 week   Diabetes mellitus with hypoglycemia in settings of vomiting - CBG's stable during hospital stay  Metformin was on hold - ok to resume upon discharge  Hemoglobin A1c 5.3  S/P gastric bypass Admitted with volume depletion, was treated with hydration   Vitamin B12 deficiency in the setting of gastric bypass No neurological symptoms, B12 IM given, repeat weekly for at least 6 weeks. Discharge on oral supplements as well. Check B12 levels in 6 weeks   Non-alcoholic fatty liver disease Avoid NSAIDs given her history of cirrhosis Follow up with PCP   Metabolic acidosis with lactic acidosis: - Resolved  Due to profound dehydration and metformin  Hyperkalemia Resolved  Anemia of chronic diseases - B12 deficiency although not macrocytic, hemodilution contributing as well  B12 replacement  No signs of overt bleeding  Monitor CBC in 1 week   All other chronic medical condition were stable during the hospitalization.  On the day of the discharge the patient's vitals were stable, and no other acute medical condition were reported by patient. the patient was felt safe to be discharge to home   Discharge Instructions  You were cared for by a hospitalist during your hospital stay. If you have any questions about your discharge medications or the care you received while you were in the hospital after you are discharged, you can call the unit and asked to speak with the hospitalist on call if the hospitalist that took care of you is not available. Once you are discharged, your primary care physician will handle any further medical issues. Please note that NO REFILLS for any discharge medications will be authorized once you are discharged, as it is imperative that  you return to your primary care physician (or establish a relationship with a primary care physician if you do not have one) for your aftercare needs so that they can reassess your need for medications and  monitor your lab values.  Discharge Instructions    Call MD for:  difficulty breathing, headache or visual disturbances   Complete by:  As directed    Call MD for:  extreme fatigue   Complete by:  As directed    Call MD for:  hives   Complete by:  As directed    Call MD for:  persistant dizziness or light-headedness   Complete by:  As directed    Call MD for:  persistant nausea and vomiting   Complete by:  As directed    Call MD for:  redness, tenderness, or signs of infection (pain, swelling, redness, odor or green/yellow discharge around incision site)   Complete by:  As directed    Call MD for:  severe uncontrolled pain   Complete by:  As directed    Call MD for:  temperature >100.4   Complete by:  As directed    Diet - low sodium heart healthy   Complete by:  As directed    Discharge instructions   Complete by:  As directed    Hold Lasix until evaluated by primary care doctor  Keep self well hydrated   Increase activity slowly   Complete by:  As directed      Allergies as of 11/17/2017   No Known Allergies     Medication List    STOP taking these medications   furosemide 20 MG tablet Commonly known as:  LASIX     TAKE these medications   allopurinol 100 MG tablet Commonly known as:  ZYLOPRIM Take 100 mg by mouth daily.   ALPRAZolam 0.5 MG tablet Commonly known as:  XANAX TAKE 1 TABLET BY MOUTH 3 TIMES A DAY What changed:    how much to take  how to take this  when to take this   atorvastatin 40 MG tablet Commonly known as:  LIPITOR Take 40 mg at bedtime by mouth.   buPROPion 300 MG 24 hr tablet Commonly known as:  WELLBUTRIN XL Take 300 mg daily by mouth.   citalopram 20 MG tablet Commonly known as:  CELEXA Take 20 mg daily by mouth.   cyanocobalamin 1000 MCG tablet Take 1 tablet (1,000 mcg total) by mouth daily. Start taking on:  11/18/2017   cyanocobalamin 1000 MCG/ML injection Commonly known as:  (VITAMIN B-12) Inject 1 mL (1,000 mcg  total) into the muscle once a week for 6 doses. Start taking on:  11/23/2017   dicyclomine 20 MG tablet Commonly known as:  BENTYL Take 20 mg 4 (four) times daily -  before meals and at bedtime by mouth.   ESTRACE 1 MG tablet Generic drug:  estradiol Take 1 mg by mouth daily.   metFORMIN 1000 MG tablet Commonly known as:  GLUCOPHAGE Take 1 tablet (1,000 mg total) by mouth 2 (two) times daily. Start taking on:  11/18/2017   pantoprazole 40 MG tablet Commonly known as:  PROTONIX Take 1 tablet (40 mg total) by mouth daily before breakfast.   TRULICITY 0.75 MG/0.5ML Sopn Generic drug:  Dulaglutide Inject 0.75 mg every Thursday into the skin.       No Known Allergies  Consultations:  Nephrology    Procedures/Studies: US Renal  Result Date: 11/15/2017 CLINICAL DATA:  Acute kidney injury. EXAM:  RENAL / URINARY TRACT ULTRASOUND COMPLETE COMPARISON:  CT scan of November 14, 2017. FINDINGS: Right Kidney: Length: 12.9 cm. Echogenicity within normal limits. No mass or hydronephrosis visualized. Left Kidney: Length: 13.9 cm. Echogenicity within normal limits. No mass or hydronephrosis visualized. Bladder: Appears normal for degree of bladder distention. IMPRESSION: Normal renal ultrasound. Electronically Signed   By: Lupita RaiderJames  Green Jr, M.D.   On: 11/15/2017 09:08     Discharge Exam: Vitals:   11/17/17 0545 11/17/17 0917  BP: 118/61 117/81  Pulse: 74 83  Resp: 18 18  Temp: 97.8 F (36.6 C) 98.1 F (36.7 C)  SpO2: 98% 99%   Vitals:   11/16/17 1637 11/16/17 2100 11/17/17 0545 11/17/17 0917  BP: 111/67 129/84 118/61 117/81  Pulse: 81 77 74 83  Resp: 17 18 18 18   Temp: 97.9 F (36.6 C) 97.6 F (36.4 C) 97.8 F (36.6 C) 98.1 F (36.7 C)  TempSrc: Oral Oral Oral Oral  SpO2: 99% 99% 98% 99%  Weight:  90 kg (198 lb 6.6 oz)    Height:        General: NAD Cardiovascular: RRR, S1/S2 + Respiratory: CTA bilaterally, no wheezing, no rhonchi Abdominal: Soft, NT,  ND Extremities: no edema   The results of significant diagnostics from this hospitalization (including imaging, microbiology, ancillary and laboratory) are listed below for reference.     Microbiology: No results found for this or any previous visit (from the past 240 hour(s)).   Labs: BNP (last 3 results) No results for input(s): BNP in the last 8760 hours. Basic Metabolic Panel: Recent Labs  Lab 11/14/17 1753 11/15/17 0435 11/16/17 0549 11/17/17 0550  NA 133* 136 140 139  K 4.1 4.0 3.7 4.3  CL 91* 103 110 107  CO2 22 24 26 26   GLUCOSE 73 109* 107* 79  BUN 44* 36* 20 15  CREATININE 6.85* 5.21* 2.46* 1.78*  CALCIUM 8.4* 7.3* 7.9* 8.3*  PHOS  --   --   --  2.6   Liver Function Tests: Recent Labs  Lab 11/15/17 0435 11/17/17 0550  AST 17  --   ALT 11*  --   ALKPHOS 43  --   BILITOT 0.5  --   PROT 4.8*  --   ALBUMIN 3.0* 3.2*   No results for input(s): LIPASE, AMYLASE in the last 168 hours. No results for input(s): AMMONIA in the last 168 hours. CBC: Recent Labs  Lab 11/14/17 1753 11/15/17 0435 11/16/17 0549  WBC 5.7 3.2* 2.2*  HGB 9.9* 8.7* 8.8*  HCT 29.7* 26.1* 27.3*  MCV 85.6 85.9 87.5  PLT 131* 94* 101*   Cardiac Enzymes: No results for input(s): CKTOTAL, CKMB, CKMBINDEX, TROPONINI in the last 168 hours. BNP: Invalid input(s): POCBNP CBG: Recent Labs  Lab 11/16/17 0740 11/16/17 1147 11/16/17 1636 11/16/17 2058 11/17/17 0801  GLUCAP 126* 95 104* 78 113*   D-Dimer No results for input(s): DDIMER in the last 72 hours. Hgb A1c Recent Labs    11/14/17 1753  HGBA1C 5.3   Lipid Profile No results for input(s): CHOL, HDL, LDLCALC, TRIG, CHOLHDL, LDLDIRECT in the last 72 hours. Thyroid function studies No results for input(s): TSH, T4TOTAL, T3FREE, THYROIDAB in the last 72 hours.  Invalid input(s): FREET3 Anemia work up Recent Labs    11/15/17 0945  VITAMINB12 125*  FOLATE 8.6  FERRITIN 31  TIBC 325  IRON 66  RETICCTPCT 0.6    Urinalysis    Component Value Date/Time   COLORURINE STRAW (A) 11/15/2017  0510   APPEARANCEUR CLEAR 11/15/2017 0510   LABSPEC 1.008 11/15/2017 0510   PHURINE 5.0 11/15/2017 0510   GLUCOSEU NEGATIVE 11/15/2017 0510   HGBUR NEGATIVE 11/15/2017 0510   BILIRUBINUR NEGATIVE 11/15/2017 0510   BILIRUBINUR Neg 03/23/2012 1402   KETONESUR NEGATIVE 11/15/2017 0510   PROTEINUR NEGATIVE 11/15/2017 0510   UROBILINOGEN 0.2 10/01/2015 1435   NITRITE NEGATIVE 11/15/2017 0510   LEUKOCYTESUR NEGATIVE 11/15/2017 0510   Sepsis Labs Invalid input(s): PROCALCITONIN,  WBC,  LACTICIDVEN Microbiology No results found for this or any previous visit (from the past 240 hour(s)).   Time coordinating discharge:  34 minutes  SIGNED:  Latrelle DodrillEdwin Silva, MD  [facility] 11/17/2017, 11:10 AM  Pager please text page via  [email or web address] Password [email or web address]

## 2017-11-17 NOTE — Progress Notes (Signed)
[name] to be D/C'd Home per MD order.  Discussed prescriptions and follow up appointments with the patient. Prescriptions given to patient, medication list explained in detail. Pt verbalized understanding.  Allergies as of 11/17/2017   No Known Allergies     Medication List    STOP taking these medications   furosemide 20 MG tablet Commonly known as:  LASIX     TAKE these medications   allopurinol 100 MG tablet Commonly known as:  ZYLOPRIM Take 100 mg by mouth daily.   ALPRAZolam 0.5 MG tablet Commonly known as:  XANAX TAKE 1 TABLET BY MOUTH 3 TIMES A DAY What changed:    how much to take  how to take this  when to take this   atorvastatin 40 MG tablet Commonly known as:  LIPITOR Take 40 mg at bedtime by mouth.   buPROPion 300 MG 24 hr tablet Commonly known as:  WELLBUTRIN XL Take 300 mg daily by mouth.   citalopram 20 MG tablet Commonly known as:  CELEXA Take 20 mg daily by mouth.   cyanocobalamin 1000 MCG tablet Take 1 tablet (1,000 mcg total) by mouth daily. Start taking on:  11/18/2017   cyanocobalamin 1000 MCG/ML injection Commonly known as:  (VITAMIN B-12) Inject 1 mL (1,000 mcg total) into the muscle once a week for 6 doses. Start taking on:  11/23/2017   dicyclomine 20 MG tablet Commonly known as:  BENTYL Take 20 mg 4 (four) times daily -  before meals and at bedtime by mouth.   ESTRACE 1 MG tablet Generic drug:  estradiol Take 1 mg by mouth daily.   metFORMIN 1000 MG tablet Commonly known as:  GLUCOPHAGE Take 1 tablet (1,000 mg total) by mouth 2 (two) times daily. Start taking on:  11/18/2017   pantoprazole 40 MG tablet Commonly known as:  PROTONIX Take 1 tablet (40 mg total) by mouth daily before breakfast.   TRULICITY 0.75 MG/0.5ML Sopn Generic drug:  Dulaglutide Inject 0.75 mg every Thursday into the skin.       Vitals:   11/17/17 0545 11/17/17 0917  BP: 118/61 117/81  Pulse: 74 83  Resp: 18 18  Temp: 97.8 F (36.6  C) 98.1 F (36.7 C)  SpO2: 98% 99%    Skin clean, dry and intact without evidence of skin break down, no evidence of skin tears noted. IV catheter discontinued intact. Site without signs and symptoms of complications. Dressing and pressure applied. Pt denies pain at this time. No complaints noted.  An After Visit Summary was printed and given to the patient. Patient escorted via WC, and D/C home via private auto.  Cecil CobbsMolly Weismiller RN [facility] Phone [phone]

## 2018-02-17 ENCOUNTER — Ambulatory Visit: Payer: 59 | Admitting: Neurology

## 2018-03-07 ENCOUNTER — Emergency Department (HOSPITAL_BASED_OUTPATIENT_CLINIC_OR_DEPARTMENT_OTHER)
Admission: EM | Admit: 2018-03-07 | Discharge: 2018-03-07 | Disposition: A | Discharge status: Home / Self Care | Payer: 59 | Attending: Emergency Medicine | Admitting: Emergency Medicine

## 2018-03-07 ENCOUNTER — Emergency Department (HOSPITAL_BASED_OUTPATIENT_CLINIC_OR_DEPARTMENT_OTHER): Payer: 59

## 2018-03-07 DIAGNOSIS — E119 Type 2 diabetes mellitus without complications: Secondary | ICD-10-CM | POA: Diagnosis not present

## 2018-03-07 DIAGNOSIS — N200 Calculus of kidney: Secondary | ICD-10-CM | POA: Diagnosis not present

## 2018-03-07 DIAGNOSIS — R112 Nausea with vomiting, unspecified: Secondary | ICD-10-CM | POA: Insufficient documentation

## 2018-03-07 DIAGNOSIS — Z7984 Long term (current) use of oral hypoglycemic drugs: Secondary | ICD-10-CM | POA: Insufficient documentation

## 2018-03-07 DIAGNOSIS — Z79899 Other long term (current) drug therapy: Secondary | ICD-10-CM | POA: Insufficient documentation

## 2018-03-07 DIAGNOSIS — R109 Unspecified abdominal pain: Secondary | ICD-10-CM | POA: Diagnosis present

## 2018-03-07 LAB — URINALYSIS, MICROSCOPIC (REFLEX)

## 2018-03-07 LAB — COMPREHENSIVE METABOLIC PANEL
ALK PHOS: 54 U/L (ref 38–126)
ALT: 12 U/L — AB (ref 14–54)
AST: 25 U/L (ref 15–41)
Albumin: 4.2 g/dL (ref 3.5–5.0)
Anion gap: 12 (ref 5–15)
BUN: 23 mg/dL — ABNORMAL HIGH (ref 6–20)
CHLORIDE: 98 mmol/L — AB (ref 101–111)
CO2: 29 mmol/L (ref 22–32)
CREATININE: 1.83 mg/dL — AB (ref 0.44–1.00)
Calcium: 9.4 mg/dL (ref 8.9–10.3)
GFR calc Af Amer: 37 mL/min — ABNORMAL LOW (ref >60)
GFR, EST NON AFRICAN AMERICAN: 32 mL/min — AB (ref >60)
Glucose, Bld: 82 mg/dL (ref 65–99)
Potassium: 3.6 mmol/L (ref 3.5–5.1)
SODIUM: 139 mmol/L (ref 135–145)
Total Bilirubin: 0.8 mg/dL (ref 0.3–1.2)
Total Protein: 7 g/dL (ref 6.5–8.1)

## 2018-03-07 LAB — URINALYSIS, ROUTINE W REFLEX MICROSCOPIC
BILIRUBIN URINE: NEGATIVE
Glucose, UA: NEGATIVE mg/dL
Ketones, ur: NEGATIVE mg/dL
LEUKOCYTES UA: NEGATIVE
NITRITE: NEGATIVE
PH: 5.5 (ref 5.0–8.0)
Protein, ur: NEGATIVE mg/dL
SPECIFIC GRAVITY, URINE: 1.02 (ref 1.005–1.030)

## 2018-03-07 LAB — CBC
HCT: 33.5 % — ABNORMAL LOW (ref 36.0–46.0)
HEMOGLOBIN: 10.8 g/dL — AB (ref 12.0–15.0)
MCH: 27.4 pg (ref 26.0–34.0)
MCHC: 32.2 g/dL (ref 30.0–36.0)
MCV: 85 fL (ref 78.0–100.0)
PLATELETS: 146 10*3/uL — AB (ref 150–400)
RBC: 3.94 MIL/uL (ref 3.87–5.11)
RDW: 13.9 % (ref 11.5–15.5)
WBC: 6.7 10*3/uL (ref 4.0–10.5)

## 2018-03-07 MED ORDER — MORPHINE SULFATE (PF) 4 MG/ML IV SOLN
4.0000 mg | Freq: Once | INTRAVENOUS | Status: AC
  Administered 2018-03-07: 4 mg via INTRAVENOUS
  Filled 2018-03-07: qty 1

## 2018-03-07 MED ORDER — FENTANYL CITRATE (PF) 100 MCG/2ML IJ SOLN
100.0000 ug | Freq: Once | INTRAMUSCULAR | Status: AC
  Administered 2018-03-07: 100 ug via INTRAVENOUS
  Filled 2018-03-07: qty 2

## 2018-03-07 MED ORDER — ONDANSETRON HCL 4 MG/2ML IJ SOLN
4.0000 mg | Freq: Once | INTRAMUSCULAR | Status: AC
  Administered 2018-03-07: 4 mg via INTRAVENOUS
  Filled 2018-03-07: qty 2

## 2018-03-07 MED ORDER — ONDANSETRON 4 MG PO TBDP: 4.0000 mg | ORAL_TABLET | Freq: Three times a day (TID) | ORAL | 0 refills | Status: DC | PRN

## 2018-03-07 MED ORDER — TAMSULOSIN HCL 0.4 MG PO CAPS: 0.4000 mg | ORAL_CAPSULE | Freq: Every day | ORAL | 0 refills | Status: AC

## 2018-03-07 MED ORDER — OXYCODONE-ACETAMINOPHEN 5-325 MG PO TABS
2.0000 | ORAL_TABLET | Freq: Once | ORAL | Status: AC
  Administered 2018-03-07: 2 via ORAL
  Filled 2018-03-07: qty 2

## 2018-03-07 MED ORDER — SODIUM CHLORIDE 0.9 % IV BOLUS (SEPSIS)
1000.0000 mL | Freq: Once | INTRAVENOUS | Status: AC
  Administered 2018-03-07: 1000 mL via INTRAVENOUS

## 2018-03-07 MED ORDER — TAMSULOSIN HCL 0.4 MG PO CAPS
0.4000 mg | ORAL_CAPSULE | Freq: Once | ORAL | Status: AC
  Administered 2018-03-07: 0.4 mg via ORAL
  Filled 2018-03-07: qty 1

## 2018-03-07 MED ORDER — OXYCODONE-ACETAMINOPHEN 5-325 MG PO TABS: 1.0000 | ORAL_TABLET | ORAL | 0 refills | Status: DC | PRN

## 2018-03-07 MED FILL — OXYCODONE-ACETAMINOPHEN 5-3: 5-325 | 2 days supply | Qty: 12 | Fill #0

## 2018-03-07 MED FILL — ONDANSETRON ODT 4 MG TABLET: 4 | 3 days supply | Qty: 8 | Fill #0

## 2018-03-07 MED FILL — TAMSULOSIN HCL 0.4 MG CAP: 0.4 | 7 days supply | Qty: 7 | Fill #0

## 2018-03-07 NOTE — ED Notes (Signed)
ED Provider at bedside. 

## 2018-03-07 NOTE — ED Notes (Signed)
Patient transported back from Ultrasound 

## 2018-03-07 NOTE — ED Triage Notes (Signed)
Pt reports right flank pain with N/V since approx 0500 this morning; hx of kidney stones. Denies dysuria or abd pain.

## 2018-03-07 NOTE — ED Provider Notes (Signed)
[facility] EMERGENCY DEPARTMENT Provider Note   CSN: [id number] Arrival date & time: 03/07/18  0840     History   Chief Complaint Chief Complaint  Patient presents with  . Flank Pain    right    HPI [name] is a 47 y.o. female.  47 year old female with past medical history including kidney stones, CKD, NASH cirrhosis, T2DM, gastric bypass who p/w right flank pain.  This morning around 5 AM, she woke up with sudden onset of severe right flank pain that feels similar to previous kidney stones.  She did notice some blood in her urine today while in the ED. She has had N/V, no diarrhea, dysuria, abdominal pain, or cough/cold symptoms.  She follows with a urologist, Dr. Vinie Sill in [facility]. She reports having recent bloodwork drawn and PCP told her that kidney function still has not returned to normal.  She took Tylenol prior to arrival with no relief.   The history is provided by the patient.  Flank Pain     Past Medical History:  Diagnosis Date  . Abdominal pain   . Blood in urine   . Blurred vision    when feeling very fatigued   . Chills   . Diabetes mellitus   . Dizziness   . Fatigue    loss of sleep  . Generalized headaches   . Hyperlipidemia   . Kidney stones   . Liver disease   . Non-alcoholic fatty liver disease   . Ovarian cyst   . Poor appetite   . Recurrent UTI   . Thrombophlebitis   . Wears glasses   . Weight decrease     Patient Active Problem List   Diagnosis Date Noted  . AKI (acute kidney injury) (HCC) 11/14/2017  . Metabolic acidosis 11/14/2017  . Hyperkalemia 11/14/2017  . Anxiety 11/14/2017  . Hoarseness 06/14/2013  . Paresthesia 06/14/2013  . Recurrent kidney stones 05/09/2013  . Tension headache 05/09/2013  . Lumbar paraspinal muscle spasm 02/06/2013  . Pericardial effusion 11/29/2012  . 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 Hyperlipidemia 12/16/2011  . Cold sore 12/16/2011  . Depression 08/19/2011  . Pelvic pain in  female 06/24/2011  . Diabetes mellitus (HCC) 06/01/2011  . S/P gastric bypass 06/01/2011  . Non-alcoholic fatty liver disease 06/01/2011    Past Surgical History:  Procedure Laterality Date  . ABDOMINAL HYSTERECTOMY  2008   for polycystic ovaries  . APPENDECTOMY    . BILATERAL SALPINGOOPHORECTOMY  2012   Dr Tenny Craw  . CHOLECYSTECTOMY  2005  . COLONOSCOPY     negative, done for pain  . CYSTOSTOMY W/ BLADDER BIOPSY    . EXPLORATORY LAPAROTOMY  09/01/11  . GASTRIC BYPASS  10/2010  . kidney stones    . LAPAROSCOPY  10/19/2011   Procedure: LAPAROSCOPY OPERATIVE;  Surgeon: Miguel Aschoff;  Location: [facility];  Service: Gynecology;  Laterality: N/A;  Operative Laparoscopy with Lysis Of Adhesions  . LAPAROTOMY  10/19/2011   Procedure: LAPAROTOMY;  Surgeon: Miguel Aschoff;  Location: [facility];  Service: Gynecology;  Laterality: Bilateral;  Laparotomy With Bilateral Oophorectomy  . TUBAL LIGATION     x3  . UPPER GASTROINTESTINAL ENDOSCOPY     to assess varices    OB History    No data available       Home Medications    Prior to Admission medications   Medication Sig Start Date End Date Taking? Authorizing Provider  allopurinol (ZYLOPRIM) 100 MG tablet Take 100 mg by  mouth daily.   Yes [provider]  buPROPion (WELLBUTRIN XL) 300 MG 24 hr tablet Take 300 mg daily by mouth. 09/18/17  Yes [provider]  metFORMIN (GLUCOPHAGE) 1000 MG tablet Take 1 tablet (1,000 mg total) by mouth 2 (two) times daily. 11/18/17  Yes Randel PiggSilva Zapata, Dorma RussellEdwin, MD  ALPRAZolam Prudy Feeler(XANAX) 0.5 MG tablet TAKE 1 TABLET BY MOUTH 3 TIMES A DAY Patient taking differently: Take 0.5 mg by mouth three times a day 08/23/13   Sheliah Hatchabori, Katherine E, MD  atorvastatin (LIPITOR) 40 MG tablet Take 40 mg at bedtime by mouth. 08/23/17   [provider]  citalopram (CELEXA) 20 MG tablet Take 20 mg daily by mouth. 04/12/17   [provider]  dicyclomine (BENTYL) 20 MG tablet Take 20 mg 4 (four) times daily -  before  meals and at bedtime by mouth.  09/20/17   [provider]  estradiol (ESTRACE) 1 MG tablet Take 1 mg by mouth daily.    [provider]  ondansetron (ZOFRAN ODT) 4 MG disintegrating tablet Take 1 tablet (4 mg total) by mouth every 8 (eight) hours as needed for nausea or vomiting. 03/07/18   Roosevelt Eimers, Ambrose Finlandachel Morgan, MD  oxyCODONE-acetaminophen (PERCOCET) 5-325 MG tablet Take 1 tablet by mouth every 4 (four) hours as needed for severe pain. 03/07/18   Alichia Alridge, Ambrose Finlandachel Morgan, MD  pantoprazole (PROTONIX) 40 MG tablet Take 1 tablet (40 mg total) by mouth daily before breakfast. 11/17/17 12/17/17  Randel PiggSilva Zapata, Dorma RussellEdwin, MD  tamsulosin (FLOMAX) 0.4 MG CAPS capsule Take 1 capsule (0.4 mg total) by mouth daily for 7 days. 03/07/18 03/14/18  Hussien Greenblatt, Ambrose Finlandachel Morgan, MD  TRULICITY 0.75 MG/0.5ML SOPN Inject 0.75 mg every Thursday into the skin. 09/01/17   [provider]    Family History Family History  Problem Relation Age of Onset  . Lung cancer Mother   . Cancer Mother        lung  . Hyperlipidemia Father   . Heart disease Father   . Stroke Father   . Hypertension Father   . Diabetes Father   . Hypertension Brother   . Other Brother        suicide    Social History Social History   Tobacco Use  . Smoking status: Never Smoker  . Smokeless tobacco: Never Used  Substance Use Topics  . Alcohol use: No    Comment: never  . Drug use: No     Allergies   Patient has no known allergies.   Review of Systems Review of Systems  Genitourinary: Positive for flank pain.   All other systems reviewed and are negative except that which was mentioned in HPI   Physical Exam Updated Vital Signs BP 110/62   Pulse 92   Temp 97.8 F (36.6 C) (Oral)   Resp 20   Ht 5\' 4"  (1.626 m)   Wt 82.1 kg (181 lb)   SpO2 99%   BMI 31.07 kg/m   Physical Exam  Constitutional: She is oriented to person, place, and time. She appears well-developed and well-nourished. She appears  distressed.  In mild distress due to pain  HENT:  Head: Normocephalic and atraumatic.  Moist mucous membranes  Eyes: Conjunctivae are normal.  Neck: Neck supple.  Cardiovascular: Normal rate, regular rhythm and normal heart sounds.  No murmur heard. Pulmonary/Chest: Effort normal and breath sounds normal.  Abdominal: Soft. Bowel sounds are normal. She exhibits no distension. There is no tenderness.  Musculoskeletal: She exhibits no edema.  Neurological: She is alert and oriented to person, place, and time.  Fluent speech  Skin: Skin is warm and dry.  Psychiatric: She has a normal mood and affect. Judgment normal.  Nursing note and vitals reviewed.    ED Treatments / Results  Labs (all labs ordered are listed, but only abnormal results are displayed) Labs Reviewed  URINALYSIS, ROUTINE W REFLEX MICROSCOPIC - Abnormal; Notable for the following components:      Result Value   APPearance HAZY (*)    Hgb urine dipstick LARGE (*)    All other components within normal limits  CBC - Abnormal; Notable for the following components:   Hemoglobin 10.8 (*)    HCT 33.5 (*)    Platelets 146 (*)    All other components within normal limits  COMPREHENSIVE METABOLIC PANEL - Abnormal; Notable for the following components:   Chloride 98 (*)    BUN 23 (*)    Creatinine, Ser 1.83 (*)    ALT 12 (*)    GFR calc non Af Amer 32 (*)    GFR calc Af Amer 37 (*)    All other components within normal limits  URINALYSIS, MICROSCOPIC (REFLEX) - Abnormal; Notable for the following components:   Bacteria, UA FEW (*)    Squamous Epithelial / LPF 6-30 (*)    All other components within normal limits    EKG  EKG Interpretation None       Radiology US Renal  Result Date: 03/07/2018 CLINICAL DATA:  47 year old female with flank pain and hematuria. Initial encounter. EXAM: RENAL / URINARY TRACT ULTRASOUND COMPLETE COMPARISON:  11/15/2017 ultrasound and CT. FINDINGS: Right Kidney: Length: 11.0.  Echogenicity within normal limits. Slightly lobular contour. No mass or hydronephrosis visualized. Left Kidney: Length: 12.3. Echogenicity within normal limits. Slightly lobular contour. No mass or hydronephrosis visualized. Bladder: Appears normal for degree of bladder distention. Bilateral ureteral jets noted. IMPRESSION: Negative renal sonogram. Lobular contour of the kidneys without mass or hydronephrosis noted. Electronically Signed   By: Lacy Duverney M.D.   On: 03/07/2018 13:00    Procedures Procedures (including critical care time)  Medications Ordered in ED Medications  ondansetron (ZOFRAN) injection 4 mg (4 mg Intravenous Given 03/07/18 0913)  morphine 4 MG/ML injection 4 mg (4 mg Intravenous Given 03/07/18 0913)  tamsulosin (FLOMAX) capsule 0.4 mg (0.4 mg Oral Given 03/07/18 0949)  fentaNYL (SUBLIMAZE) injection 100 mcg (100 mcg Intravenous Given 03/07/18 1038)  oxyCODONE-acetaminophen (PERCOCET/ROXICET) 5-325 MG per tablet 2 tablet (2 tablets Oral Given 03/07/18 1037)  sodium chloride 0.9 % bolus 1,000 mL (0 mLs Intravenous Stopped 03/07/18 1339)     Initial Impression / Assessment and Plan / ED Course  I have reviewed the triage vital signs and the nursing notes.  Pertinent labs & imaging results that were available during my care of the patient were reviewed by me and considered in my medical decision making (see chart for details).     Patient uncomfortable but nontoxic on exam, afebrile.  No abdominal tenderness.  Labs show UA with blood, no evidence of infection.  Normal WBC count, creatinine 1.83.  Her recent lab work from outside hospital showed creatinine of 1.5.  Gave fluids, Zofran, and pain medications.  I reviewed chart and noted at least 13 abdominal CT scans in the past. Given her cumulative radiation exposure and h/o kidney stones, I do not feel CT imaging would benefit her today as she is at significant radiation exposure risk.  Obtained renal ultrasound which  shows no  hydronephrosis or evidence of obstruction.  He was comfortable on reassessment.  Given that current symptoms are similar to previous episodes with kidney stone, discussed supportive care for kidney stone treatment including pain and nausea control, Flomax, and close follow-up with her urologist.  Extensively reviewed return precautions including fever, intractable pain, intractable vomiting.  She voiced understanding was discharged in satisfactory condition.  Final Clinical Impressions(s) / ED Diagnoses   Final diagnoses:  Kidney stone    ED Discharge Orders        Ordered    tamsulosin (FLOMAX) 0.4 MG CAPS capsule  Daily     03/07/18 1333    oxyCODONE-acetaminophen (PERCOCET) 5-325 MG tablet  Every 4 hours PRN     03/07/18 1333    ondansetron (ZOFRAN ODT) 4 MG disintegrating tablet  Every 8 hours PRN     03/07/18 1333       Josanna Hefel, Ambrose Finland, MD 03/07/18 1349

## 2018-03-07 NOTE — ED Notes (Signed)
Pt given ice water, per MD order.

## 2018-03-28 ENCOUNTER — Encounter: Payer: Self-pay | Admitting: *Deleted

## 2018-03-28 ENCOUNTER — Ambulatory Visit: Payer: 59 | Admitting: Neurology

## 2018-03-28 ENCOUNTER — Telehealth: Payer: Self-pay | Admitting: *Deleted

## 2018-03-28 NOTE — Telephone Encounter (Signed)
Pt no showed new pt appt on 03/28/2018 @ 07:30.

## 2018-03-29 ENCOUNTER — Encounter: Payer: Self-pay | Admitting: Neurology

## 2018-11-28 DIAGNOSIS — E1169 Type 2 diabetes mellitus with other specified complication: Secondary | ICD-10-CM

## 2018-11-28 DIAGNOSIS — R079 Chest pain, unspecified: Secondary | ICD-10-CM

## 2018-11-28 DIAGNOSIS — D649 Anemia, unspecified: Secondary | ICD-10-CM

## 2018-11-28 DIAGNOSIS — M546 Pain in thoracic spine: Secondary | ICD-10-CM

## 2018-11-28 DIAGNOSIS — K7460 Unspecified cirrhosis of liver: Secondary | ICD-10-CM

## 2018-11-28 DIAGNOSIS — E669 Obesity, unspecified: Secondary | ICD-10-CM

## 2018-11-28 DIAGNOSIS — N183 Chronic kidney disease, stage 3 (moderate): Secondary | ICD-10-CM

## 2018-11-28 DIAGNOSIS — R635 Abnormal weight gain: Secondary | ICD-10-CM

## 2018-11-29 DIAGNOSIS — K7460 Unspecified cirrhosis of liver: Secondary | ICD-10-CM | POA: Diagnosis not present

## 2018-11-29 DIAGNOSIS — D649 Anemia, unspecified: Secondary | ICD-10-CM | POA: Diagnosis not present

## 2018-11-29 DIAGNOSIS — E669 Obesity, unspecified: Secondary | ICD-10-CM | POA: Diagnosis not present

## 2018-11-29 DIAGNOSIS — M546 Pain in thoracic spine: Secondary | ICD-10-CM | POA: Diagnosis not present

## 2018-12-01 ENCOUNTER — Encounter (HOSPITAL_BASED_OUTPATIENT_CLINIC_OR_DEPARTMENT_OTHER): Payer: Self-pay | Admitting: Emergency Medicine

## 2018-12-01 ENCOUNTER — Emergency Department (HOSPITAL_BASED_OUTPATIENT_CLINIC_OR_DEPARTMENT_OTHER)
Admission: EM | Admit: 2018-12-01 | Discharge: 2018-12-01 | Disposition: A | Discharge status: Home / Self Care | Payer: 59 | Attending: Emergency Medicine | Admitting: Emergency Medicine

## 2018-12-01 ENCOUNTER — Other Ambulatory Visit: Payer: Self-pay

## 2018-12-01 DIAGNOSIS — N201 Calculus of ureter: Secondary | ICD-10-CM | POA: Diagnosis not present

## 2018-12-01 DIAGNOSIS — F419 Anxiety disorder, unspecified: Secondary | ICD-10-CM | POA: Diagnosis not present

## 2018-12-01 DIAGNOSIS — Z7984 Long term (current) use of oral hypoglycemic drugs: Secondary | ICD-10-CM | POA: Insufficient documentation

## 2018-12-01 DIAGNOSIS — Z79899 Other long term (current) drug therapy: Secondary | ICD-10-CM | POA: Diagnosis not present

## 2018-12-01 DIAGNOSIS — Z9884 Bariatric surgery status: Secondary | ICD-10-CM | POA: Diagnosis not present

## 2018-12-01 DIAGNOSIS — Z9049 Acquired absence of other specified parts of digestive tract: Secondary | ICD-10-CM | POA: Diagnosis not present

## 2018-12-01 DIAGNOSIS — F329 Major depressive disorder, single episode, unspecified: Secondary | ICD-10-CM | POA: Diagnosis not present

## 2018-12-01 DIAGNOSIS — N189 Chronic kidney disease, unspecified: Secondary | ICD-10-CM | POA: Diagnosis not present

## 2018-12-01 DIAGNOSIS — E1122 Type 2 diabetes mellitus with diabetic chronic kidney disease: Secondary | ICD-10-CM | POA: Diagnosis not present

## 2018-12-01 DIAGNOSIS — R1030 Lower abdominal pain, unspecified: Secondary | ICD-10-CM | POA: Diagnosis present

## 2018-12-01 LAB — URINALYSIS, MICROSCOPIC (REFLEX)

## 2018-12-01 LAB — URINALYSIS, ROUTINE W REFLEX MICROSCOPIC
BILIRUBIN URINE: NEGATIVE
Glucose, UA: NEGATIVE mg/dL
Ketones, ur: NEGATIVE mg/dL
Leukocytes, UA: NEGATIVE
NITRITE: NEGATIVE
Protein, ur: NEGATIVE mg/dL
SPECIFIC GRAVITY, URINE: 1.02 (ref 1.005–1.030)
pH: 6 (ref 5.0–8.0)

## 2018-12-01 MED ORDER — OXYCODONE-ACETAMINOPHEN 5-325 MG PO TABS: 1.0000 | ORAL_TABLET | ORAL | 0 refills | Status: DC | PRN

## 2018-12-01 MED ORDER — HYDROMORPHONE HCL 1 MG/ML IJ SOLN
1.0000 mg | Freq: Once | INTRAMUSCULAR | Status: AC
  Administered 2018-12-01: 1 mg via INTRAVENOUS
  Filled 2018-12-01: qty 1

## 2018-12-01 MED ORDER — OXYCODONE-ACETAMINOPHEN 5-325 MG PO TABS
1.0000 | ORAL_TABLET | Freq: Once | ORAL | Status: AC
  Administered 2018-12-01: 1 via ORAL
  Filled 2018-12-01: qty 1

## 2018-12-01 MED ORDER — ONDANSETRON 8 MG PO TBDP: 8.0000 mg | ORAL_TABLET | Freq: Three times a day (TID) | ORAL | 0 refills | Status: DC | PRN

## 2018-12-01 MED ORDER — ONDANSETRON HCL 4 MG/2ML IJ SOLN
4.0000 mg | Freq: Once | INTRAMUSCULAR | Status: AC
  Administered 2018-12-01: 4 mg via INTRAVENOUS
  Filled 2018-12-01: qty 2

## 2018-12-01 NOTE — ED Provider Notes (Signed)
[facility] Provider Note: Lowella Dell, MD, FACEP  CSN: [id number] MRN: [id number] ARRIVAL: 12/01/18 at 0424 ROOM: MH11/[facility]11   CHIEF COMPLAINT  Flank Pain   HISTORY OF PRESENT ILLNESS  12/01/18 4:33 AM [name] is a 47 y.o. female with a history of kidney stones.  She was recently admitted to [facility] for bilateral flank pain along with weight gain.  While in the hospital she had a CT scan which showed bilateral nephrolithiasis but no ureterolithiasis.  She was discharged home on Robaxin and tramadol.  She is here with acute right flank pain which began about 1:00 this morning.  She rates the pain as a 6 out of 10 and describes it as like previous kidney stones.  It has not been relieved with the prescription she was given.  She has associated nausea and dark urine.   Past Medical History:  Diagnosis Date  . Abdominal pain   . Blood in urine   . Blurred vision    when feeling very fatigued   . Chills   . CKD (chronic kidney disease)   . Depression with anxiety   . Diabetes mellitus   . Dizziness   . Edema of both lower legs due to peripheral venous insufficiency   . Fatigue    loss of sleep  . Generalized headaches   . History of Roux-en-Y gastric bypass   . Hyperlipidemia   . IBS (irritable bowel syndrome)   . Kidney stones   . Liver disease   . Mixed insomnia   . Non-alcoholic fatty liver disease   . Ovarian cyst   . Poor appetite   . Recurrent UTI   . Thrombophlebitis   . Type 2 diabetes mellitus with hyperlipidemia (HCC)   . Vitamin D deficiency   . Wears glasses   . Weight decrease     Past Surgical History:  Procedure Laterality Date  . ABDOMINAL HYSTERECTOMY  2008   for polycystic ovaries  . APPENDECTOMY    . BILATERAL SALPINGOOPHORECTOMY  2012   Dr Tenny Craw  . CHOLECYSTECTOMY  2005  . COLONOSCOPY     negative, done for pain  . CYSTOSTOMY W/ BLADDER BIOPSY    . EXPLORATORY LAPAROTOMY  09/01/11  . GASTRIC BYPASS  10/2010  .  kidney stones    . LAPAROSCOPY  10/19/2011   Procedure: LAPAROSCOPY OPERATIVE;  Surgeon: Miguel Aschoff;  Location: [facility];  Service: Gynecology;  Laterality: N/A;  Operative Laparoscopy with Lysis Of Adhesions  . LAPAROTOMY  10/19/2011   Procedure: LAPAROTOMY;  Surgeon: Miguel Aschoff;  Location: [facility];  Service: Gynecology;  Laterality: Bilateral;  Laparotomy With Bilateral Oophorectomy  . TUBAL LIGATION     x3  . UPPER GASTROINTESTINAL ENDOSCOPY     to assess varices    Family History  Problem Relation Age of Onset  . Lung cancer Mother   . Cancer Mother        lung  . Hyperlipidemia Father   . Heart disease Father   . Stroke Father   . Hypertension Father   . Diabetes Father   . Crohn's disease Father   . Colon cancer Father   . Hypertension Brother   . Other Brother        suicide    Social History   Tobacco Use  . Smoking status: Never Smoker  . Smokeless tobacco: Never Used  Substance Use Topics  . Alcohol use: No    Comment: never  . Drug  use: No    Prior to Admission medications   Medication Sig Start Date End Date Taking? Authorizing Provider  docusate sodium (COLACE) 100 MG capsule Take 100 mg by mouth 2 (two) times daily.   Yes [provider]  escitalopram (LEXAPRO) 20 MG tablet Take 20 mg by mouth daily.   Yes [provider]  furosemide (LASIX) 20 MG tablet Take 20 mg by mouth daily.   Yes [provider]  gabapentin (NEURONTIN) 100 MG capsule Take 200 mg by mouth 3 (three) times daily.   Yes [provider]  lurasidone (LATUDA) 40 MG TABS tablet Take 40 mg by mouth daily with breakfast.   Yes [provider]  metFORMIN (GLUCOPHAGE) 1000 MG tablet Take 1 tablet (1,000 mg total) by mouth 2 (two) times daily. 11/18/17  Yes Randel PiggSilva Zapata, Dorma RussellEdwin, MD  nortriptyline (PAMELOR) 10 MG capsule Take 10 mg by mouth 2 (two) times daily.   Yes [provider]  potassium chloride (K-DUR) 10 MEQ tablet Take 10 mEq by mouth 2  (two) times daily.   Yes [provider]  TRULICITY 0.75 MG/0.5ML SOPN Inject 0.75 mg every Thursday into the skin. 09/01/17  Yes [provider]  zolpidem (AMBIEN) 5 MG tablet Take 5 mg by mouth at bedtime as needed for sleep.   Yes [provider]  allopurinol (ZYLOPRIM) 100 MG tablet Take 100 mg by mouth daily.    [provider]  ALPRAZolam Prudy Feeler(XANAX) 0.5 MG tablet TAKE 1 TABLET BY MOUTH 3 TIMES A DAY Patient taking differently: Take 0.5 mg by mouth three times a day 08/23/13   Sheliah Hatchabori, Katherine E, MD  atorvastatin (LIPITOR) 40 MG tablet Take 40 mg at bedtime by mouth. 08/23/17   [provider]  buPROPion (WELLBUTRIN XL) 300 MG 24 hr tablet Take 300 mg daily by mouth. 09/18/17   [provider]    Allergies Patient has no known allergies.   REVIEW OF SYSTEMS  Negative except as noted here or in the History of Present Illness.   PHYSICAL EXAMINATION  Initial Vital Signs Blood pressure 130/89, pulse 95, temperature 98 F (36.7 C), temperature source Oral, resp. rate 20, height 5\' 4"  (1.626 m), weight 88.9 kg, SpO2 100 %.  Examination General: Well-developed, well-nourished female in no acute distress; appearance consistent with age of record HENT: normocephalic; atraumatic Eyes: pupils equal, round and reactive to light; extraocular muscles intact Neck: supple Heart: regular rate and rhythm Lungs: clear to auscultation bilaterally Abdomen: soft; nondistended; nontender; bowel sounds present GU: No CVA tenderness Extremities: No deformity; full range of motion; pulses normal Neurologic: Awake, alert and oriented; motor function intact in all extremities and symmetric; no facial droop Skin: Warm and dry Psychiatric: Flat affect   RESULTS  Summary of this visit's results, reviewed by myself:   EKG Interpretation  Date/Time:    Ventricular Rate:    PR Interval:    QRS Duration:   QT Interval:    QTC Calculation:   R  Axis:     Text Interpretation:        Laboratory Studies: Results for orders placed or performed during the hospital encounter of 12/01/18 (from the past 24 hour(s))  Urinalysis, Routine w reflex microscopic     Status: Abnormal   Collection Time: 12/01/18  4:37 AM  Result Value Ref Range   Color, Urine YELLOW YELLOW   APPearance HAZY (A) CLEAR   Specific Gravity, Urine 1.020 1.005 - 1.030   pH 6.0 5.0 -  8.0   Glucose, UA NEGATIVE NEGATIVE mg/dL   Hgb urine dipstick LARGE (A) NEGATIVE   Bilirubin Urine NEGATIVE NEGATIVE   Ketones, ur NEGATIVE NEGATIVE mg/dL   Protein, ur NEGATIVE NEGATIVE mg/dL   Nitrite NEGATIVE NEGATIVE   Leukocytes, UA NEGATIVE NEGATIVE  Urinalysis, Microscopic (reflex)     Status: Abnormal   Collection Time: 12/01/18  4:37 AM  Result Value Ref Range   RBC / HPF >50 0 - 5 RBC/hpf   WBC, UA 0-5 0 - 5 WBC/hpf   Bacteria, UA FEW (A) NONE SEEN   Squamous Epithelial / LPF 6-10 0 - 5   Imaging Studies: No results found.  ED COURSE and MDM  Nursing notes and initial vitals signs, including pulse oximetry, reviewed.  Vitals:   12/01/18 0428 12/01/18 0429  BP: 130/89   Pulse: 95   Resp: 20   Temp: 98 F (36.7 C)   TempSrc: Oral   SpO2: 100%   Weight:  88.9 kg  Height:  5\' 4"  (1.626 m)   5:47 AM Pain and nausea adequately managed with IV medications.  Acute right flank pain with hematuria consistent with passage of known kidney stone into her ureter.  She is already been referred to [facility] urology for follow-up.  She was advised to take care with narcotic medication as she is already on benzodiazepines.   Consultation with the [facility] state controlled substances database reveals the patient has received 2 opioid analgesic prescriptions in the past 2 years.  She gets monthly prescriptions for alprazolam and zolpidem or Belsomra.   PROCEDURES    ED DIAGNOSES     ICD-10-CM   1. Ureterolithiasis N20.1        Shamonica Schadt, MD 12/01/18  [phone]

## 2018-12-01 NOTE — ED Triage Notes (Signed)
Pt c/o right flank pain. Pt states she was [facility] mon [facility]

## 2018-12-01 NOTE — ED Notes (Signed)
C/o rt flank pain onset 0100 this am w nausea  Was at [facility] monday and Tuesday for same

## 2020-04-11 ENCOUNTER — Inpatient Hospital Stay: Admission: AD | Admit: 2020-04-11 | Payer: 59 | Admitting: Internal Medicine

## 2020-04-11 DIAGNOSIS — E872 Acidosis: Secondary | ICD-10-CM

## 2020-04-11 DIAGNOSIS — E1140 Type 2 diabetes mellitus with diabetic neuropathy, unspecified: Secondary | ICD-10-CM

## 2020-04-11 DIAGNOSIS — I5032 Chronic diastolic (congestive) heart failure: Secondary | ICD-10-CM

## 2020-04-11 DIAGNOSIS — I951 Orthostatic hypotension: Secondary | ICD-10-CM

## 2020-04-12 DIAGNOSIS — E1140 Type 2 diabetes mellitus with diabetic neuropathy, unspecified: Secondary | ICD-10-CM | POA: Diagnosis not present

## 2020-04-12 DIAGNOSIS — I5032 Chronic diastolic (congestive) heart failure: Secondary | ICD-10-CM | POA: Diagnosis not present

## 2020-04-12 DIAGNOSIS — E872 Acidosis: Secondary | ICD-10-CM | POA: Diagnosis not present

## 2020-04-12 DIAGNOSIS — I951 Orthostatic hypotension: Secondary | ICD-10-CM | POA: Diagnosis not present

## 2020-04-13 DIAGNOSIS — E872 Acidosis: Secondary | ICD-10-CM | POA: Diagnosis not present

## 2020-04-13 DIAGNOSIS — I951 Orthostatic hypotension: Secondary | ICD-10-CM | POA: Diagnosis not present

## 2020-04-13 DIAGNOSIS — I5032 Chronic diastolic (congestive) heart failure: Secondary | ICD-10-CM | POA: Diagnosis not present

## 2020-04-13 DIAGNOSIS — E1140 Type 2 diabetes mellitus with diabetic neuropathy, unspecified: Secondary | ICD-10-CM | POA: Diagnosis not present

## 2020-09-05 ENCOUNTER — Other Ambulatory Visit: Payer: Self-pay

## 2020-09-05 ENCOUNTER — Encounter (HOSPITAL_BASED_OUTPATIENT_CLINIC_OR_DEPARTMENT_OTHER): Payer: Self-pay | Admitting: Emergency Medicine

## 2020-09-05 ENCOUNTER — Emergency Department (HOSPITAL_BASED_OUTPATIENT_CLINIC_OR_DEPARTMENT_OTHER)
Admission: EM | Admit: 2020-09-05 | Discharge: 2020-09-05 | Disposition: A | Discharge status: Home / Self Care | Payer: No Typology Code available for payment source | Attending: Emergency Medicine | Admitting: Emergency Medicine

## 2020-09-05 ENCOUNTER — Emergency Department (HOSPITAL_BASED_OUTPATIENT_CLINIC_OR_DEPARTMENT_OTHER): Payer: No Typology Code available for payment source

## 2020-09-05 DIAGNOSIS — U071 COVID-19: Secondary | ICD-10-CM | POA: Diagnosis not present

## 2020-09-05 DIAGNOSIS — Z79899 Other long term (current) drug therapy: Secondary | ICD-10-CM | POA: Insufficient documentation

## 2020-09-05 DIAGNOSIS — R042 Hemoptysis: Secondary | ICD-10-CM

## 2020-09-05 DIAGNOSIS — R0602 Shortness of breath: Secondary | ICD-10-CM

## 2020-09-05 DIAGNOSIS — R05 Cough: Secondary | ICD-10-CM | POA: Diagnosis present

## 2020-09-05 DIAGNOSIS — E119 Type 2 diabetes mellitus without complications: Secondary | ICD-10-CM | POA: Insufficient documentation

## 2020-09-05 DIAGNOSIS — Z7984 Long term (current) use of oral hypoglycemic drugs: Secondary | ICD-10-CM | POA: Insufficient documentation

## 2020-09-05 LAB — BASIC METABOLIC PANEL
Anion gap: 11 (ref 5–15)
BUN: 14 mg/dL (ref 6–20)
CO2: 29 mmol/L (ref 22–32)
Calcium: 8.2 mg/dL — ABNORMAL LOW (ref 8.9–10.3)
Chloride: 98 mmol/L (ref 98–111)
Creatinine, Ser: 1.57 mg/dL — ABNORMAL HIGH (ref 0.44–1.00)
GFR calc Af Amer: 45 mL/min — ABNORMAL LOW (ref >60)
GFR calc non Af Amer: 39 mL/min — ABNORMAL LOW (ref >60)
Glucose, Bld: 97 mg/dL (ref 70–99)
Potassium: 3.1 mmol/L — ABNORMAL LOW (ref 3.5–5.1)
Sodium: 138 mmol/L (ref 135–145)

## 2020-09-05 LAB — CBC WITH DIFFERENTIAL/PLATELET
Abs Immature Granulocytes: 0.02 10*3/uL (ref 0.00–0.07)
Basophils Absolute: 0 10*3/uL (ref 0.0–0.1)
Basophils Relative: 1 %
Eosinophils Absolute: 0.1 10*3/uL (ref 0.0–0.5)
Eosinophils Relative: 1 %
HCT: 30.5 % — ABNORMAL LOW (ref 36.0–46.0)
Hemoglobin: 9.2 g/dL — ABNORMAL LOW (ref 12.0–15.0)
Immature Granulocytes: 0 %
Lymphocytes Relative: 47 %
Lymphs Abs: 2.5 10*3/uL (ref 0.7–4.0)
MCH: 22.8 pg — ABNORMAL LOW (ref 26.0–34.0)
MCHC: 30.2 g/dL (ref 30.0–36.0)
MCV: 75.5 fL — ABNORMAL LOW (ref 80.0–100.0)
Monocytes Absolute: 0.4 10*3/uL (ref 0.1–1.0)
Monocytes Relative: 7 %
Neutro Abs: 2.4 10*3/uL (ref 1.7–7.7)
Neutrophils Relative %: 44 %
Platelets: 254 10*3/uL (ref 150–400)
RBC: 4.04 MIL/uL (ref 3.87–5.11)
RDW: 17.7 % — ABNORMAL HIGH (ref 11.5–15.5)
WBC: 5.3 10*3/uL (ref 4.0–10.5)
nRBC: 0 % (ref 0.0–0.2)

## 2020-09-05 LAB — TROPONIN I (HIGH SENSITIVITY): Troponin I (High Sensitivity): <2 ng/L (ref <18)

## 2020-09-05 MED ORDER — POTASSIUM CHLORIDE CRYS ER 20 MEQ PO TBCR
EXTENDED_RELEASE_TABLET | ORAL | Status: AC
  Filled 2020-09-05: qty 2

## 2020-09-05 MED ORDER — IOHEXOL 350 MG/ML SOLN
100.0000 mL | Freq: Once | INTRAVENOUS | Status: AC | PRN
  Administered 2020-09-05: 80 mL via INTRAVENOUS

## 2020-09-05 MED ORDER — POTASSIUM CHLORIDE CRYS ER 20 MEQ PO TBCR: 30.0000 meq | EXTENDED_RELEASE_TABLET | Freq: Once | ORAL | Status: DC

## 2020-09-05 MED ORDER — POTASSIUM CHLORIDE CRYS ER 20 MEQ PO TBCR
40.0000 meq | EXTENDED_RELEASE_TABLET | Freq: Once | ORAL | Status: AC
  Administered 2020-09-05: 40 meq via ORAL

## 2020-09-05 NOTE — ED Provider Notes (Signed)
[facility] EMERGENCY DEPARTMENT Provider Note   CSN: [id number] Arrival date & time: 09/05/20  1808     History Chief Complaint  Patient presents with  . Cough    COVID+    SHINE [name] is a 49 y.o. female.  [name] is a 49 y.o. female with a history of recent Covid infection, diabetes, CKD, hyperlipidemia, fatty liver disease, IBS, who presents to the emergency department for evaluation of cough with blood-tinged sputum. Patient was diagnosed with Covid initially on 8/26, she has not required hospitalization, but states that her cough which has been intermittently productive has started to have some blood-tinged sputum over the past 2 days.  She has had some constant shortness of breath and chest tightness and chest pain primarily with cough throughout her course of Covid, but she does feel like this is gotten worse over the past 2 days.  She states her symptoms have never really seem to improve or resolve.  She called her primary care doctor regarding new symptoms of blood streaks in sputum and was told to come to the ED for further evaluation.  She initially had an outpatient appointment scheduled with him for follow-up next week.  She has not had any fevers over the past week.  Denies any lower extremity swelling or pain.  No nausea, vomiting, diarrhea or abdominal pain.        Past Medical History:  Diagnosis Date  . Abdominal pain   . Blood in urine   . Blurred vision    when feeling very fatigued   . Chills   . CKD (chronic kidney disease)   . Depression with anxiety   . Diabetes mellitus   . Dizziness   . Edema of both lower legs due to peripheral venous insufficiency   . Fatigue    loss of sleep  . Generalized headaches   . History of Roux-en-Y gastric bypass   . Hyperlipidemia   . IBS (irritable bowel syndrome)   . Kidney stones   . Liver disease   . Mixed insomnia   . Non-alcoholic fatty liver disease   . Ovarian cyst   . Poor appetite     . Recurrent UTI   . Thrombophlebitis   . Type 2 diabetes mellitus with hyperlipidemia (HCC)   . Vitamin D deficiency   . Wears glasses   . Weight decrease     Patient Active Problem List   Diagnosis Date Noted  . AKI (acute kidney injury) (HCC) 11/14/2017  . Metabolic acidosis 11/14/2017  . Hyperkalemia 11/14/2017  . Anxiety 11/14/2017  . Hoarseness 06/14/2013  . Paresthesia 06/14/2013  . Recurrent kidney stones 05/09/2013  . Tension headache 05/09/2013  . Lumbar paraspinal muscle spasm 02/06/2013  . Pericardial effusion 11/29/2012  . 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 Hyperlipidemia 12/16/2011  . Cold sore 12/16/2011  . Depression 08/19/2011  . Pelvic pain in female 06/24/2011  . Diabetes mellitus (HCC) 06/01/2011  . S/P gastric bypass 06/01/2011  . Non-alcoholic fatty liver disease 06/01/2011    Past Surgical History:  Procedure Laterality Date  . ABDOMINAL HYSTERECTOMY  2008   for polycystic ovaries  . APPENDECTOMY    . BILATERAL SALPINGOOPHORECTOMY  2012   Dr Tenny Craw  . CHOLECYSTECTOMY  2005  . COLONOSCOPY     negative, done for pain  . CYSTOSTOMY W/ BLADDER BIOPSY    . EXPLORATORY LAPAROTOMY  09/01/11  . GASTRIC BYPASS  10/2010  . kidney stones    .  LAPAROSCOPY  10/19/2011   Procedure: LAPAROSCOPY OPERATIVE;  Surgeon: Miguel AschoffAllan Ross;  Location: [facility];  Service: Gynecology;  Laterality: N/A;  Operative Laparoscopy with Lysis Of Adhesions  . LAPAROTOMY  10/19/2011   Procedure: LAPAROTOMY;  Surgeon: Miguel AschoffAllan Ross;  Location: [facility];  Service: Gynecology;  Laterality: Bilateral;  Laparotomy With Bilateral Oophorectomy  . TUBAL LIGATION     x3  . UPPER GASTROINTESTINAL ENDOSCOPY     to assess varices     OB History   No obstetric history on file.     Family History  Problem Relation Age of Onset  . Lung cancer Mother   . Cancer Mother        lung  . Hyperlipidemia Father   . Heart disease Father   . Stroke Father   . Hypertension Father   . Diabetes Father   .  Crohn's disease Father   . Colon cancer Father   . Hypertension Brother   . Other Brother        suicide    Social History   Tobacco Use  . Smoking status: Never Smoker  . Smokeless tobacco: Never Used  Substance Use Topics  . Alcohol use: No    Comment: never  . Drug use: No    Home Medications Prior to Admission medications   Medication Sig Start Date End Date Taking? Authorizing Provider  allopurinol (ZYLOPRIM) 100 MG tablet Take 100 mg by mouth daily.    [provider]  ALPRAZolam Prudy Feeler(XANAX) 0.5 MG tablet TAKE 1 TABLET BY MOUTH 3 TIMES A DAY Patient taking differently: Take 0.5 mg by mouth three times a day 08/23/13   Sheliah Hatchabori, Katherine E, MD  atorvastatin (LIPITOR) 40 MG tablet Take 40 mg at bedtime by mouth. 08/23/17   [provider]  buPROPion (WELLBUTRIN XL) 300 MG 24 hr tablet Take 300 mg daily by mouth. 09/18/17   [provider]  docusate sodium (COLACE) 100 MG capsule Take 100 mg by mouth 2 (two) times daily.    [provider]  escitalopram (LEXAPRO) 20 MG tablet Take 20 mg by mouth daily.    [provider]  furosemide (LASIX) 20 MG tablet Take 20 mg by mouth daily.    [provider]  gabapentin (NEURONTIN) 100 MG capsule Take 200 mg by mouth 3 (three) times daily.    [provider]  lurasidone (LATUDA) 40 MG TABS tablet Take 40 mg by mouth daily with breakfast.    [provider]  metFORMIN (GLUCOPHAGE) 1000 MG tablet Take 1 tablet (1,000 mg total) by mouth 2 (two) times daily. 11/18/17   Lenox PondsSilva Zapata, Edwin, MD  nortriptyline (PAMELOR) 10 MG capsule Take 10 mg by mouth 2 (two) times daily.    [provider]  ondansetron (ZOFRAN ODT) 8 MG disintegrating tablet Take 1 tablet (8 mg total) by mouth every 8 (eight) hours as needed for nausea or vomiting. 12/01/18   Molpus, John, MD  oxyCODONE-acetaminophen (PERCOCET) 5-325 MG tablet Take 1 tablet by mouth every 4 (four) hours as needed (for  flank pain). 12/01/18   Molpus, John, MD  potassium chloride (K-DUR) 10 MEQ tablet Take 10 mEq by mouth 2 (two) times daily.    [provider]  TRULICITY 0.75 MG/0.5ML SOPN Inject 0.75 mg every Thursday into the skin. 09/01/17   [provider]  zolpidem (AMBIEN) 5 MG tablet Take 5 mg by mouth at bedtime as needed for sleep.    [provider]  Allergies    Patient has no known allergies.  Review of Systems   Review of Systems  Constitutional: Positive for fatigue. Negative for chills and fever.  HENT: Negative.   Respiratory: Positive for cough, chest tightness and shortness of breath.   Cardiovascular: Positive for chest pain.  Gastrointestinal: Negative for abdominal pain, nausea and vomiting.  Genitourinary: Negative for dysuria.  Musculoskeletal: Negative for arthralgias and myalgias.  Skin: Negative for color change and rash.  Neurological: Negative for dizziness, syncope and light-headedness.  All other systems reviewed and are negative.   Physical Exam Updated Vital Signs BP 122/76 (BP Location: Right Arm)   Pulse 68   Temp 98.5 F (36.9 C) (Oral)   Resp 20   Ht 5\' 4"  (1.626 m)   Wt 102.1 kg   SpO2 96%   BMI 38.62 kg/m   Physical Exam Vitals and nursing note reviewed.  Constitutional:      General: She is not in acute distress.    Appearance: Normal appearance. She is well-developed and normal weight. She is not ill-appearing or diaphoretic.     Comments: Well-appearing and in no distress  HENT:     Head: Normocephalic and atraumatic.     Mouth/Throat:     Mouth: Mucous membranes are moist.     Pharynx: Oropharynx is clear.  Eyes:     General:        Right eye: No discharge.        Left eye: No discharge.  Cardiovascular:     Rate and Rhythm: Normal rate and regular rhythm.     Pulses: Normal pulses.     Heart sounds: Normal heart sounds. No murmur heard.  No friction rub. No gallop.   Pulmonary:     Effort: Pulmonary effort  is normal. No respiratory distress.     Breath sounds: Normal breath sounds. No wheezing, rhonchi or rales.     Comments: Respirations equal and unlabored, patient able to speak in full sentences, lungs clear to auscultation bilaterally with some decreased breath sounds in the bases Abdominal:     General: Abdomen is flat. Bowel sounds are normal. There is no distension.     Palpations: Abdomen is soft. There is no mass.     Tenderness: There is no abdominal tenderness. There is no guarding.     Comments: Abdomen soft, nondistended, nontender to palpation in all quadrants without guarding or peritoneal signs  Musculoskeletal:        General: No deformity or signs of injury.     Cervical back: Neck supple.     Right lower leg: No edema.     Left lower leg: No edema.  Skin:    General: Skin is warm and dry.     Findings: No rash.  Neurological:     Mental Status: She is alert and oriented to person, place, and time.     Coordination: Coordination normal.     Comments: Speech is clear, able to follow commands Moves extremities without ataxia, coordination intact  Psychiatric:        Mood and Affect: Mood normal.        Behavior: Behavior normal.     ED Results / Procedures / Treatments   Labs (all labs ordered are listed, but only abnormal results are displayed) Labs Reviewed  BASIC METABOLIC PANEL - Abnormal; Notable for the following components:      Result Value   Potassium 3.1 (*)    Creatinine, Ser 1.57 (*)  Calcium 8.2 (*)    GFR calc non Af Amer 39 (*)    GFR calc Af Amer 45 (*)    All other components within normal limits  CBC WITH DIFFERENTIAL/PLATELET - Abnormal; Notable for the following components:   Hemoglobin 9.2 (*)    HCT 30.5 (*)    MCV 75.5 (*)    MCH 22.8 (*)    RDW 17.7 (*)    All other components within normal limits  TROPONIN I (HIGH SENSITIVITY)  TROPONIN I (HIGH SENSITIVITY)    EKG None  Radiology CT Angio Chest PE W and/or Wo  Contrast  Result Date: 09/05/2020 CLINICAL DATA:  COVID-19 positive 2 weeks ago, productive cough, fatigue EXAM: CT ANGIOGRAPHY CHEST WITH CONTRAST TECHNIQUE: Multidetector CT imaging of the chest was performed using the standard protocol during bolus administration of intravenous contrast. Multiplanar CT image reconstructions and MIPs were obtained to evaluate the vascular anatomy. CONTRAST:  80mL OMNIPAQUE IOHEXOL 350 MG/ML SOLN COMPARISON:  12/22/2013, 09/05/2020 FINDINGS: Cardiovascular: This is a technically adequate evaluation of the pulmonary vasculature. No filling defects or pulmonary emboli. The heart is unremarkable without pericardial effusion. Mild atherosclerosis of the coronary vasculature. Normal caliber of the thoracic aorta. Mediastinum/Nodes: No enlarged mediastinal, hilar, or axillary lymph nodes. Thyroid gland, trachea, and esophagus demonstrate no significant findings. Lungs/Pleura: Multifocal bilateral ground-glass airspace disease greatest at the lung bases, consistent with COVID-19 pneumonia. No effusion or pneumothorax. Central airways are patent. Upper Abdomen: Postsurgical changes from bariatric surgery. Nodular contour of the liver capsule may suggest underlying cirrhosis. Gallbladder is surgically absent. Musculoskeletal: There are no acute or destructive bony lesions. Reconstructed images demonstrate no additional findings. Review of the MIP images confirms the above findings. IMPRESSION: 1. No evidence of pulmonary embolus. 2. Multifocal bilateral pneumonia compatible with COVID 19. Electronically Signed   By: Sharlet Salina M.D.   On: 09/05/2020 21:15   DG Chest Portable 1 View  Result Date: 09/05/2020 CLINICAL DATA:  Cough, hemoptysis, COVID-19 diagnosed 08/22/2020 EXAM: PORTABLE CHEST 1 VIEW COMPARISON:  04/11/2020 FINDINGS: Single frontal view of the chest demonstrates basilar predominant interstitial opacities, with minimal ground-glass consolidation. No effusion or  pneumothorax. No acute bony abnormalities. IMPRESSION: 1. Basilar predominant interstitial and ground-glass opacities consistent with given history of COVID-19 pneumonia. Electronically Signed   By: Sharlet Salina M.D.   On: 09/05/2020 18:46     Procedures Procedures (including critical care time)  Medications Ordered in ED Medications  potassium chloride SA (KLOR-CON) CR tablet 40 mEq (40 mEq Oral Given 09/05/20 2052)  iohexol (OMNIPAQUE) 350 MG/ML injection 100 mL (80 mLs Intravenous Contrast Given 09/05/20 2100)    ED Course  I have reviewed the triage vital signs and the nursing notes.  Pertinent labs & imaging results that were available during my care of the patient were reviewed by me and considered in my medical decision making (see chart for details).    MDM Rules/Calculators/A&P                         49 year old female presents with continued cough, shortness of breath and chest tightness after she was diagnosed with Covid on 8/26, over the past 2 days she has noticed some faint blood streaks in her sputum and became concerned her PCP requested that she come in for further evaluation.  She does feel like her shortness of breath has been a little worse over the past few days.  On arrival she has normal O2  sats is not tachypneic with no increased work of breathing, her lungs are clear bilaterally with some decreased breath sounds in the bases.  She is overall well-appearing.  Given that her symptoms did not improve and worsen I have lower suspicion for superimposed bacterial infection and she just completed a course of azithromycin prescribed by her PCP.  Given blood-tinged sputum at this point, will get work-up to rule out PE.  With lab work, chest x-ray and CT angio of the chest.    I have independently ordered, reviewed and interpreted all labs and imaging: CBC: No leukocytosis, hemoglobin of 9.2 stable when compared to previous BMP: Mild hypokalemia 3.1, no other significant  electrolyte derangements, creatinine of 1.57 which appears consistent with baseline, known history of CKD Troponin negative Chest x-ray: Bibasilar interstitial and groundglass opacities consistent with Covid pneumonia CTA of the chest with no evidence of PE, multifocal bilateral pneumonia compatible with COVID-19 infection noted.  Patient's work-up today is reassuring, suspect she is having prolonged Covid symptoms with some bronchial inflammation leading to blood-tinged sputum she has not had any grossly bloody hemoptysis.  We will have her continue to treat symptomatically and follow-up closely with her PCP as planned.  She ambulated in the room here and did not have any episodes of hypoxia.  Discussed return precautions and will discharge home in good condition.   Final Clinical Impression(s) / ED Diagnoses Final diagnoses:  COVID-19 virus infection  Bloody sputum  Shortness of breath    Rx / DC Orders ED Discharge Orders    None       Dartha Lodge, [place] 09/09/20 1159    Sabas Sous, MD 09/09/20 [phone]

## 2020-09-05 NOTE — ED Triage Notes (Signed)
Diagnosed with COVID 8/26. Concerned because of blood tinged sputum when she coughs.

## 2020-09-05 NOTE — ED Notes (Signed)
States was told had covid approx 2 weeks ago, having prod cough. Still feels very fatigued and chest hurts a lot when coughing , has some SOB at times. States did not rec any Covid Vaccines

## 2020-09-05 NOTE — ED Notes (Signed)
Placed on cont cardiac monitoring with cont POX monitoring and int NBP assessments 

## 2020-09-05 NOTE — Discharge Instructions (Signed)
Your evaluation today is reassuring and does not show signs of a blood clot.  Your chest x-ray and CT continue to show signs of inflammation and Covid pneumonia in your lungs and this is likely why you are continuing to have some cough, shortness of breath and can see some blood in your sputum.  Continue to treat symptoms supportively and follow-up with your primary care provider as planned next week.  Return for new or worsening symptoms.

## 2020-09-21 ENCOUNTER — Emergency Department (HOSPITAL_BASED_OUTPATIENT_CLINIC_OR_DEPARTMENT_OTHER): Payer: No Typology Code available for payment source

## 2020-09-21 ENCOUNTER — Other Ambulatory Visit: Payer: Self-pay

## 2020-09-21 ENCOUNTER — Encounter (HOSPITAL_BASED_OUTPATIENT_CLINIC_OR_DEPARTMENT_OTHER): Payer: Self-pay | Admitting: Emergency Medicine

## 2020-09-21 ENCOUNTER — Emergency Department (HOSPITAL_BASED_OUTPATIENT_CLINIC_OR_DEPARTMENT_OTHER)
Admission: EM | Admit: 2020-09-21 | Discharge: 2020-09-21 | Disposition: A | Discharge status: Home / Self Care | Payer: No Typology Code available for payment source | Attending: Emergency Medicine | Admitting: Emergency Medicine

## 2020-09-21 DIAGNOSIS — N189 Chronic kidney disease, unspecified: Secondary | ICD-10-CM | POA: Insufficient documentation

## 2020-09-21 DIAGNOSIS — E1122 Type 2 diabetes mellitus with diabetic chronic kidney disease: Secondary | ICD-10-CM | POA: Diagnosis not present

## 2020-09-21 DIAGNOSIS — R519 Headache, unspecified: Secondary | ICD-10-CM | POA: Diagnosis not present

## 2020-09-21 DIAGNOSIS — G43109 Migraine with aura, not intractable, without status migrainosus: Secondary | ICD-10-CM

## 2020-09-21 DIAGNOSIS — E876 Hypokalemia: Secondary | ICD-10-CM

## 2020-09-21 DIAGNOSIS — D61818 Other pancytopenia: Secondary | ICD-10-CM | POA: Diagnosis not present

## 2020-09-21 DIAGNOSIS — R200 Anesthesia of skin: Secondary | ICD-10-CM | POA: Insufficient documentation

## 2020-09-21 DIAGNOSIS — Z7984 Long term (current) use of oral hypoglycemic drugs: Secondary | ICD-10-CM | POA: Diagnosis not present

## 2020-09-21 DIAGNOSIS — R410 Disorientation, unspecified: Secondary | ICD-10-CM | POA: Diagnosis present

## 2020-09-21 DIAGNOSIS — Z79899 Other long term (current) drug therapy: Secondary | ICD-10-CM | POA: Diagnosis not present

## 2020-09-21 LAB — COMPREHENSIVE METABOLIC PANEL
ALT: 18 U/L (ref 0–44)
AST: 30 U/L (ref 15–41)
Albumin: 3.8 g/dL (ref 3.5–5.0)
Alkaline Phosphatase: 72 U/L (ref 38–126)
Anion gap: 13 (ref 5–15)
BUN: 13 mg/dL (ref 6–20)
CO2: 29 mmol/L (ref 22–32)
Calcium: 8.4 mg/dL — ABNORMAL LOW (ref 8.9–10.3)
Chloride: 98 mmol/L (ref 98–111)
Creatinine, Ser: 1.39 mg/dL — ABNORMAL HIGH (ref 0.44–1.00)
GFR calc Af Amer: 52 mL/min — ABNORMAL LOW (ref >60)
GFR calc non Af Amer: 45 mL/min — ABNORMAL LOW (ref >60)
Glucose, Bld: 116 mg/dL — ABNORMAL HIGH (ref 70–99)
Potassium: 3.1 mmol/L — ABNORMAL LOW (ref 3.5–5.1)
Sodium: 140 mmol/L (ref 135–145)
Total Bilirubin: 0.5 mg/dL (ref 0.3–1.2)
Total Protein: 6.5 g/dL (ref 6.5–8.1)

## 2020-09-21 LAB — CBC WITH DIFFERENTIAL/PLATELET
Abs Immature Granulocytes: 0 10*3/uL (ref 0.00–0.07)
Basophils Absolute: 0 10*3/uL (ref 0.0–0.1)
Basophils Relative: 1 %
Eosinophils Absolute: 0.2 10*3/uL (ref 0.0–0.5)
Eosinophils Relative: 7 %
HCT: 31.6 % — ABNORMAL LOW (ref 36.0–46.0)
Hemoglobin: 9.3 g/dL — ABNORMAL LOW (ref 12.0–15.0)
Immature Granulocytes: 0 %
Lymphocytes Relative: 45 %
Lymphs Abs: 1.5 10*3/uL (ref 0.7–4.0)
MCH: 23 pg — ABNORMAL LOW (ref 26.0–34.0)
MCHC: 29.4 g/dL — ABNORMAL LOW (ref 30.0–36.0)
MCV: 78.2 fL — ABNORMAL LOW (ref 80.0–100.0)
Monocytes Absolute: 0.3 10*3/uL (ref 0.1–1.0)
Monocytes Relative: 8 %
Neutro Abs: 1.3 10*3/uL — ABNORMAL LOW (ref 1.7–7.7)
Neutrophils Relative %: 39 %
Platelets: 110 10*3/uL — ABNORMAL LOW (ref 150–400)
RBC: 4.04 MIL/uL (ref 3.87–5.11)
RDW: 17.8 % — ABNORMAL HIGH (ref 11.5–15.5)
Smear Review: NORMAL
WBC: 3.3 10*3/uL — ABNORMAL LOW (ref 4.0–10.5)
nRBC: 0 % (ref 0.0–0.2)

## 2020-09-21 LAB — CBG MONITORING, ED: Glucose-Capillary: 113 mg/dL — ABNORMAL HIGH (ref 70–99)

## 2020-09-21 MED ORDER — SODIUM CHLORIDE 0.9 % IV SOLN
1000.0000 mL | Freq: Once | INTRAVENOUS | Status: AC
  Administered 2020-09-21: 1000 mL via INTRAVENOUS

## 2020-09-21 MED ORDER — METOCLOPRAMIDE HCL 5 MG/ML IJ SOLN
10.0000 mg | Freq: Once | INTRAMUSCULAR | Status: AC
  Administered 2020-09-21: 10 mg via INTRAVENOUS
  Filled 2020-09-21: qty 2

## 2020-09-21 MED ORDER — POTASSIUM CHLORIDE CRYS ER 20 MEQ PO TBCR
40.0000 meq | EXTENDED_RELEASE_TABLET | Freq: Once | ORAL | Status: AC
  Administered 2020-09-21: 40 meq via ORAL
  Filled 2020-09-21: qty 2

## 2020-09-21 MED ORDER — LORAZEPAM 2 MG/ML IJ SOLN
1.0000 mg | Freq: Once | INTRAMUSCULAR | Status: AC
  Administered 2020-09-21: 1 mg via INTRAVENOUS
  Filled 2020-09-21: qty 1

## 2020-09-21 MED ORDER — DIPHENHYDRAMINE HCL 50 MG/ML IJ SOLN
12.5000 mg | Freq: Once | INTRAMUSCULAR | Status: AC
  Administered 2020-09-21: 12.5 mg via INTRAVENOUS
  Filled 2020-09-21: qty 1

## 2020-09-21 NOTE — ED Notes (Signed)
Patient transported to MRI 

## 2020-09-21 NOTE — ED Provider Notes (Signed)
[facility] EMERGENCY DEPARTMENT Provider Note   CSN: [id number] Arrival date & time: 09/21/20  1209     History Chief Complaint  Patient presents with  . Altered Mental Status  . Dizziness  . Numbness    [name] is a 49 y.o. female.  Pt presents to the ED today with confusion.  Around 1115 today, she was at [facility] and developed a headache.  She said she felt dizzy and confused.  Then "she went away."  Husband said she looked awake, but was not responding to him.  They left there and continued to do some errands.  She has continued to feel dizzy.  She reports some numbness on the right side.  She does not feel weak anywhere.  Pt has recently gotten over Covid.  She went to her pcp yesterday who told her that her hemoglobin and platelet count were "off."  She does not know numbers.        Past Medical History:  Diagnosis Date  . Abdominal pain   . Blood in urine   . Blurred vision    when feeling very fatigued   . Chills   . CKD (chronic kidney disease)   . Depression with anxiety   . Diabetes mellitus   . Dizziness   . Edema of both lower legs due to peripheral venous insufficiency   . Fatigue    loss of sleep  . Generalized headaches   . History of Roux-en-Y gastric bypass   . Hyperlipidemia   . IBS (irritable bowel syndrome)   . Kidney stones   . Liver disease   . Mixed insomnia   . Non-alcoholic fatty liver disease   . Ovarian cyst   . Poor appetite   . Recurrent UTI   . Thrombophlebitis   . Type 2 diabetes mellitus with hyperlipidemia (HCC)   . Vitamin D deficiency   . Wears glasses   . Weight decrease     Patient Active Problem List   Diagnosis Date Noted  . AKI (acute kidney injury) (HCC) 11/14/2017  . Metabolic acidosis 11/14/2017  . Hyperkalemia 11/14/2017  . Anxiety 11/14/2017  . Hoarseness 06/14/2013  . Paresthesia 06/14/2013  . Recurrent kidney stones 05/09/2013  . Tension headache 05/09/2013  . Lumbar paraspinal  muscle spasm 02/06/2013  . Pericardial effusion 11/29/2012  . 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 Hyperlipidemia 12/16/2011  . Cold sore 12/16/2011  . Depression 08/19/2011  . Pelvic pain in female 06/24/2011  . Diabetes mellitus (HCC) 06/01/2011  . S/P gastric bypass 06/01/2011  . Non-alcoholic fatty liver disease 06/01/2011    Past Surgical History:  Procedure Laterality Date  . ABDOMINAL HYSTERECTOMY  2008   for polycystic ovaries  . APPENDECTOMY    . BILATERAL SALPINGOOPHORECTOMY  2012   Dr Tenny Craw  . CHOLECYSTECTOMY  2005  . COLONOSCOPY     negative, done for pain  . CYSTOSTOMY W/ BLADDER BIOPSY    . EXPLORATORY LAPAROTOMY  09/01/11  . GASTRIC BYPASS  10/2010  . kidney stones    . LAPAROSCOPY  10/19/2011   Procedure: LAPAROSCOPY OPERATIVE;  Surgeon: Miguel Aschoff;  Location: [facility];  Service: Gynecology;  Laterality: N/A;  Operative Laparoscopy with Lysis Of Adhesions  . LAPAROTOMY  10/19/2011   Procedure: LAPAROTOMY;  Surgeon: Miguel Aschoff;  Location: [facility];  Service: Gynecology;  Laterality: Bilateral;  Laparotomy With Bilateral Oophorectomy  . TUBAL LIGATION     x3  . UPPER GASTROINTESTINAL ENDOSCOPY  to assess varices     OB History   No obstetric history on file.     Family History  Problem Relation Age of Onset  . Lung cancer Mother   . Cancer Mother        lung  . Hyperlipidemia Father   . Heart disease Father   . Stroke Father   . Hypertension Father   . Diabetes Father   . Crohn's disease Father   . Colon cancer Father   . Hypertension Brother   . Other Brother        suicide    Social History   Tobacco Use  . Smoking status: Never Smoker  . Smokeless tobacco: Never Used  Substance Use Topics  . Alcohol use: No    Comment: never  . Drug use: No    Home Medications Prior to Admission medications   Medication Sig Start Date End Date Taking? Authorizing Provider  allopurinol (ZYLOPRIM) 100 MG tablet Take 100 mg by mouth daily.    [provider]  ALPRAZolam Prudy Feeler) 0.5 MG tablet TAKE 1 TABLET BY MOUTH 3 TIMES A DAY Patient taking differently: Take 0.5 mg by mouth three times a day 08/23/13   Sheliah Hatch, MD  atorvastatin (LIPITOR) 40 MG tablet Take 40 mg at bedtime by mouth. 08/23/17   [provider]  buPROPion (WELLBUTRIN XL) 300 MG 24 hr tablet Take 300 mg daily by mouth. 09/18/17   [provider]  docusate sodium (COLACE) 100 MG capsule Take 100 mg by mouth 2 (two) times daily.    [provider]  escitalopram (LEXAPRO) 20 MG tablet Take 20 mg by mouth daily.    [provider]  furosemide (LASIX) 20 MG tablet Take 20 mg by mouth daily.    [provider]  gabapentin (NEURONTIN) 100 MG capsule Take 200 mg by mouth 3 (three) times daily.    [provider]  lurasidone (LATUDA) 40 MG TABS tablet Take 40 mg by mouth daily with breakfast.    [provider]  metFORMIN (GLUCOPHAGE) 1000 MG tablet Take 1 tablet (1,000 mg total) by mouth 2 (two) times daily. 11/18/17   Lenox Ponds, MD  nortriptyline (PAMELOR) 10 MG capsule Take 10 mg by mouth 2 (two) times daily.    [provider]  ondansetron (ZOFRAN ODT) 8 MG disintegrating tablet Take 1 tablet (8 mg total) by mouth every 8 (eight) hours as needed for nausea or vomiting. 12/01/18   Molpus, John, MD  oxyCODONE-acetaminophen (PERCOCET) 5-325 MG tablet Take 1 tablet by mouth every 4 (four) hours as needed (for flank pain). 12/01/18   Molpus, John, MD  potassium chloride (K-DUR) 10 MEQ tablet Take 10 mEq by mouth 2 (two) times daily.    [provider]  TRULICITY 0.75 MG/0.5ML SOPN Inject 0.75 mg every Thursday into the skin. 09/01/17   [provider]  zolpidem (AMBIEN) 5 MG tablet Take 5 mg by mouth at bedtime as needed for sleep.    [provider]    Allergies    Patient has no known allergies.  Review of Systems   Review of Systems  Neurological: Positive for  numbness and headaches.  All other systems reviewed and are negative.   Physical Exam Updated Vital Signs BP 115/66   Pulse 77   Temp 97.6 F (36.4 C)   Resp 18   SpO2 95%   Physical Exam Vitals and nursing note reviewed.  Constitutional:  Appearance: Normal appearance.  HENT:     Head: Normocephalic and atraumatic.     Right Ear: External ear normal.     Left Ear: External ear normal.     Nose: Nose normal.     Mouth/Throat:     Mouth: Mucous membranes are moist.     Pharynx: Oropharynx is clear.  Eyes:     Extraocular Movements: Extraocular movements intact.     Conjunctiva/sclera: Conjunctivae normal.     Pupils: Pupils are equal, round, and reactive to light.  Cardiovascular:     Rate and Rhythm: Normal rate and regular rhythm.     Pulses: Normal pulses.     Heart sounds: Normal heart sounds.  Pulmonary:     Effort: Pulmonary effort is normal.     Breath sounds: Normal breath sounds.  Abdominal:     General: Abdomen is flat. Bowel sounds are normal.     Palpations: Abdomen is soft.  Musculoskeletal:        General: Normal range of motion.     Cervical back: Normal range of motion and neck supple.  Skin:    General: Skin is warm.     Capillary Refill: Capillary refill takes less than 2 seconds.  Neurological:     General: No focal deficit present.     Mental Status: She is alert and oriented to person, place, and time.  Psychiatric:        Mood and Affect: Mood normal.        Behavior: Behavior normal.        Thought Content: Thought content normal.        Judgment: Judgment normal.     ED Results / Procedures / Treatments   Labs (all labs ordered are listed, but only abnormal results are displayed) Labs Reviewed  CBC WITH DIFFERENTIAL/PLATELET - Abnormal; Notable for the following components:      Result Value   WBC 3.3 (*)    Hemoglobin 9.3 (*)    HCT 31.6 (*)    MCV 78.2 (*)    MCH 23.0 (*)    MCHC 29.4 (*)    RDW 17.8 (*)    Platelets  110 (*)    Neutro Abs 1.3 (*)    All other components within normal limits  COMPREHENSIVE METABOLIC PANEL - Abnormal; Notable for the following components:   Potassium 3.1 (*)    Glucose, Bld 116 (*)    Creatinine, Ser 1.39 (*)    Calcium 8.4 (*)    GFR calc non Af Amer 45 (*)    GFR calc Af Amer 52 (*)    All other components within normal limits  CBG MONITORING, ED - Abnormal; Notable for the following components:   Glucose-Capillary 113 (*)    All other components within normal limits  URINALYSIS, ROUTINE W REFLEX MICROSCOPIC    EKG EKG Interpretation  Date/Time:  Saturday September 21 2020 12:33:06 EDT Ventricular Rate:  88 PR Interval:    QRS Duration: 98 QT Interval:  428 QTC Calculation: 518 R Axis:   12 Text Interpretation: Sinus rhythm Prolonged QT interval No significant change since last tracing Confirmed by Jacalyn LefevreHaviland, Delainey Winstanley [phone]([id number]) on 09/21/2020 1:02:21 PM   Radiology DG Chest 2 View  Result Date: 09/21/2020 CLINICAL DATA:  History of COVID-19.  Weakness. EXAM: CHEST - 2 VIEW COMPARISON:  Chest radiograph September 9 21. FINDINGS: Monitoring leads overlie the patient. Stable cardiac and mediastinal contours. No consolidative pulmonary opacities. No pleural effusion or pneumothorax.  Osseous structures unremarkable. IMPRESSION: No active cardiopulmonary disease. Electronically Signed   By: Annia Belt M.D.   On: 09/21/2020 13:42   CT Head Wo Contrast  Result Date: 09/21/2020 CLINICAL DATA:  Patient with dizziness and confusion. EXAM: CT HEAD WITHOUT CONTRAST TECHNIQUE: Contiguous axial images were obtained from the base of the skull through the vertex without intravenous contrast. COMPARISON:  Brain CT 04/11/2020. FINDINGS: Brain: Ventricles and sulci are appropriate for patient's age. No evidence for acute cortically based infarct, intracranial hemorrhage, mass lesion or mass-effect. Vascular: Unremarkable Skull: Intact. Sinuses/Orbits: Small amount of fluid within the  sphenoid sinus. Remainder the paranasal sinuses unremarkable. Mastoid air cells are unremarkable. Orbits are unremarkable. Other: None. IMPRESSION: No acute intracranial process. Electronically Signed   By: Annia Belt M.D.   On: 09/21/2020 13:40    Procedures Procedures (including critical care time)  Medications Ordered in ED Medications  0.9 %  sodium chloride infusion (0 mLs Intravenous Stopped 09/21/20 1500)  metoCLOPramide (REGLAN) injection 10 mg (10 mg Intravenous Given 09/21/20 1318)  diphenhydrAMINE (BENADRYL) injection 12.5 mg (12.5 mg Intravenous Given 09/21/20 1315)  LORazepam (ATIVAN) injection 1 mg (1 mg Intravenous Given 09/21/20 1504)    ED Course  I have reviewed the triage vital signs and the nursing notes.  Pertinent labs & imaging results that were available during my care of the patient were reviewed by me and considered in my medical decision making (see chart for details).    MDM Rules/Calculators/A&P                          Pt does have pancytopenia which has been going on since she had Covid.    Pt's sx are improving with headache treatment.  I suspect atypical migraine.  However, MRI has been ordered in case of CVA.  Pt signed out to Dr. Dalene Seltzer at shift change. Final Clinical Impression(s) / ED Diagnoses Final diagnoses:  Pancytopenia [facility])    Rx / DC Orders ED Discharge Orders    None       Jacalyn Lefevre, MD 09/21/20 1537

## 2020-09-21 NOTE — ED Notes (Signed)
ED Provider at bedside. 

## 2020-09-21 NOTE — ED Provider Notes (Signed)
  Physical Exam  BP 119/76   Pulse 83   Temp 97.6 F (36.4 C)   Resp 16   SpO2 98%   Physical Exam  ED Course/Procedures     Procedures  MDM  Received care of patient from Dr. Particia Nearing.  Please see her note for prior history, physical and care.  Briefly this is a 49 year old female who presented with numbness and headache.  Plan to follow-up on MRI, and if no sign of CVA suspect likely complicated migraine.  MRI shows no sign of acute infarct.  Patient reports improvement.  Suspect likely complicated migraine as etiology of symptoms.  Recommend primary care physician follow-up.       Alvira Monday, MD 09/22/20 [phone]

## 2020-09-21 NOTE — ED Triage Notes (Signed)
Pt was sitting in [employer] at 1115 this morning and got a sharp pain in back of head suddenly and was dizzy and confused. Numbness and tingling on right side.

## 2020-11-14 ENCOUNTER — Encounter: Payer: Self-pay | Admitting: Neurology

## 2020-11-14 ENCOUNTER — Ambulatory Visit (INDEPENDENT_AMBULATORY_CARE_PROVIDER_SITE_OTHER): Payer: No Typology Code available for payment source | Admitting: Neurology

## 2020-11-14 VITALS — BP 124/81 | HR 83 | Ht 64.0 in | Wt 193.0 lb

## 2020-11-14 DIAGNOSIS — G40909 Epilepsy, unspecified, not intractable, without status epilepticus: Secondary | ICD-10-CM

## 2020-11-14 DIAGNOSIS — K7581 Nonalcoholic steatohepatitis (NASH): Secondary | ICD-10-CM

## 2020-11-14 DIAGNOSIS — E1142 Type 2 diabetes mellitus with diabetic polyneuropathy: Secondary | ICD-10-CM

## 2020-11-14 DIAGNOSIS — G2581 Restless legs syndrome: Secondary | ICD-10-CM

## 2020-11-14 HISTORY — DX: Nonalcoholic steatohepatitis (NASH): K75.81

## 2020-11-14 HISTORY — DX: Type 2 diabetes mellitus with diabetic polyneuropathy: E11.42

## 2020-11-14 HISTORY — DX: Restless legs syndrome: G25.81

## 2020-11-14 HISTORY — DX: Epilepsy, unspecified, not intractable, without status epilepticus: G40.909

## 2020-11-14 NOTE — Progress Notes (Signed)
[facility]  PATIENT: [name] DOB: 1971/01/01  REFERRING DOCTOR OR PCP: Gwendlyn Deutscher, MD SOURCE: Patient, notes from primary care, laboratory reports.  _________________________________   HISTORICAL  CHIEF COMPLAINT:  Chief Complaint  Patient presents with  . New Patient (Initial Visit)    RM 12 with spouse, Renae Fickle. Paper referral from Noni Saupe, MD for seizures. Had first seizure this past Sat. They were [place] [employer] selling items. Husband turned around and wife collapsed/was convulsing/foaming at mouth.  She was started on potassium pill the wed before seizure, unsure if this caused seizure or not. No other recent med changes. Been under a lot of stress recently per husband.    HISTORY OF PRESENT ILLNESS:  I had the pleasure of seeing your patient, [name], at [facility] for neurologic consultation regarding her first seizure.  She is a 49 year old woman who had a witnessed seizure last weekend..   She was at the [employer] with her husband and she was feeling completely at her baseline.   Her husband had turned around and heard a commotion and looked back.  She had fallen on the ground and had 10-15 seconds of generalized tonic clonic activity.    She was then limp for 10-30 seconds and then became conscious but was groggy.    Paramedics came and she was confused but able to answer some simple questions.  She has no memory of the event or next hour.  She had a good night's sleep the previous night.   Her sugar ws measured by EMT and was 120.    She went to [facility] and was treated and released.  By report, she had a CT scan and the husband was told it was fine.  She has never had definite seizures in the past.   She did have a spell a couple months ago at [facility] without loss of consciousness when she felt dazed and was not completely responsive for a minute.   She quickly returned to her baseline and was  fine the rest of the day.  She jerks a lot in her sleep and even rolled out of bed twice.  Additionally, she has restless leg syndrome symptoms when staying still in the evening and when trying to fall asleep at night  She has been on Buproprion for depression x 2-3 years and tolerated it well.    She is also on low dose nortriptlyine.     She takes low dose alprazolam at night only for insomnia.  Zolpidem has not been tolerated..     She has Type 2 DM and is on Trulicity.  She takes gabapentin for diabetic polyneuropathy, described as a mild burning in her feet only.  She has had more stress lately.    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     No FH of seizures.   No h/o head trauma or LOC in past.   She had a normal delivery and normal milestone achievement.      REVIEW OF SYSTEMS: Constitutional: No fevers, chills, sweats, or change in appetite.  She has restless leg syndrome. Eyes: No visual changes, double vision, eye pain Ear, nose and throat: No hearing loss, ear pain, nasal congestion, sore throat Cardiovascular: No chest pain, palpitations Respiratory: No shortness of breath at rest or with exertion.   No wheezes GastrointestinaI: No nausea, vomiting, diarrhea, abdominal pain, fecal incontinence Genitourinary: No dysuria, urinary retention or  frequency.  No nocturia. Musculoskeletal: No neck pain, back pain Integumentary: No rash, pruritus, skin lesions Neurological: as above Psychiatric: No depression at this time.  No anxiety Endocrine: No palpitations, diaphoresis, change in appetite, change in weigh or increased thirst Hematologic/Lymphatic: No anemia, purpura, petechiae. Allergic/Immunologic: No itchy/runny eyes, nasal congestion, recent allergic reactions, rashes  ALLERGIES: No Known Allergies  HOME MEDICATIONS:  Current Outpatient Medications:  .  allopurinol (ZYLOPRIM) 100 MG tablet, Take 100 mg by mouth daily., Disp: , Rfl:  .  ALPRAZolam (XANAX) 0.5 MG  tablet, TAKE 1 TABLET BY MOUTH 3 TIMES A DAY (Patient taking differently: Take 0.5 mg by mouth three times a day), Disp: 30 tablet, Rfl: 0 .  atorvastatin (LIPITOR) 40 MG tablet, Take 40 mg at bedtime by mouth., Disp: , Rfl: 3 .  buPROPion (WELLBUTRIN XL) 300 MG 24 hr tablet, Take 300 mg daily by mouth., Disp: , Rfl: 1 .  docusate sodium (COLACE) 100 MG capsule, Take 100 mg by mouth 2 (two) times daily., Disp: , Rfl:  .  furosemide (LASIX) 20 MG tablet, Take 20 mg by mouth daily., Disp: , Rfl:  .  gabapentin (NEURONTIN) 100 MG capsule, Take 200 mg by mouth 3 (three) times daily., Disp: , Rfl:  .  metFORMIN (GLUCOPHAGE) 1000 MG tablet, Take 1 tablet (1,000 mg total) by mouth 2 (two) times daily., Disp: , Rfl: 1 .  nortriptyline (PAMELOR) 10 MG capsule, Take 10 mg by mouth 2 (two) times daily., Disp: , Rfl:  .  potassium chloride (K-DUR) 10 MEQ tablet, Take 10 mEq by mouth 2 (two) times daily., Disp: , Rfl:  .  TRULICITY 0.75 MG/0.5ML SOPN, Inject 0.75 mg every Thursday into the skin., Disp: , Rfl: 0  PAST MEDICAL HISTORY: Past Medical History:  Diagnosis Date  . Abdominal pain   . Blood in urine   . Blurred vision    when feeling very fatigued   . Chills   . CKD (chronic kidney disease)   . Depression with anxiety   . Diabetes mellitus   . Dizziness   . Edema of both lower legs due to peripheral venous insufficiency   . Fatigue    loss of sleep  . Generalized headaches   . History of Roux-en-Y gastric bypass   . Hyperlipidemia   . IBS (irritable bowel syndrome)   . Kidney stones   . Liver disease   . Mixed insomnia   . Non-alcoholic fatty liver disease   . Ovarian cyst   . Poor appetite   . Recurrent UTI   . Thrombophlebitis   . Type 2 diabetes mellitus with hyperlipidemia (HCC)   . Vitamin D deficiency   . Wears glasses   . Weight decrease     PAST SURGICAL HISTORY: Past Surgical History:  Procedure Laterality Date  . ABDOMINAL HYSTERECTOMY  2008   for polycystic  ovaries  . APPENDECTOMY    . BILATERAL SALPINGOOPHORECTOMY  2012   Dr Tenny Craw  . CHOLECYSTECTOMY  2005  . COLONOSCOPY     negative, done for pain  . CYSTOSTOMY W/ BLADDER BIOPSY    . EXPLORATORY LAPAROTOMY  09/01/11  . GASTRIC BYPASS  10/2010  . kidney stones    . LAPAROSCOPY  10/19/2011   Procedure: LAPAROSCOPY OPERATIVE;  Surgeon: Miguel Aschoff;  Location: [facility];  Service: Gynecology;  Laterality: N/A;  Operative Laparoscopy with Lysis Of Adhesions  . LAPAROTOMY  10/19/2011   Procedure: LAPAROTOMY;  Surgeon: Miguel Aschoff;  Location: [facility];  Service: Gynecology;  Laterality: Bilateral;  Laparotomy With Bilateral Oophorectomy  . TUBAL LIGATION     x3  . UPPER GASTROINTESTINAL ENDOSCOPY     to assess varices    FAMILY HISTORY: Family History  Problem Relation Age of Onset  . Lung cancer Mother   . Cancer Mother        lung  . Hyperlipidemia Father   . Heart disease Father   . Stroke Father   . Hypertension Father   . Diabetes Father   . Crohn's disease Father   . Colon cancer Father   . Hypertension Brother   . Other Brother        suicide    SOCIAL HISTORY:  Social History   Socioeconomic History  . Marital status: Married    Spouse name: Not on file  . Number of children: 4  . Years of education: GED/community college  . Highest education level: Not on file  Occupational History  . Occupation: [employer]  Tobacco Use  . Smoking status: Never Smoker  . Smokeless tobacco: Never Used  Substance and Sexual Activity  . Alcohol use: No    Comment: never  . Drug use: No  . Sexual activity: Yes    Birth control/protection: Surgical, Implant  Other Topics Concern  . Not on file  Social History Narrative   Lives   Caffeine use:    Right handed    Social Determinants of Health   Financial Resource Strain:   . Difficulty of Paying Living Expenses: Not on file  Food Insecurity:   . Worried About Programme researcher, broadcasting/film/video in the Last Year: Not on file  . Ran Out of Food  in the Last Year: Not on file  Transportation Needs:   . Lack of Transportation (Medical): Not on file  . Lack of Transportation (Non-Medical): Not on file  Physical Activity:   . Days of Exercise per Week: Not on file  . Minutes of Exercise per Session: Not on file  Stress:   . Feeling of Stress : Not on file  Social Connections:   . Frequency of Communication with Friends and Family: Not on file  . Frequency of Social Gatherings with Friends and Family: Not on file  . Attends Religious Services: Not on file  . Active Member of Clubs or Organizations: Not on file  . Attends Banker Meetings: Not on file  . Marital Status: Not on file  Intimate Partner Violence:   . Fear of Current or Ex-Partner: Not on file  . Emotionally Abused: Not on file  . Physically Abused: Not on file  . Sexually Abused: Not on file     PHYSICAL EXAM  Vitals:   11/14/20 1437  BP: 124/81  Pulse: 83  Weight: 193 lb (87.5 kg)  Height: 5\' 4"  (1.626 m)    Body mass index is 33.13 kg/m.   General: The patient is well-developed and well-nourished and in no acute distress  HEENT:  Head is Gregory/AT.  Sclera are anicteric.     Neck: No carotid bruits are noted.  The neck is nontender.  Cardiovascular: The heart has a regular rate and rhythm with a normal S1 and S2. There were no murmurs, gallops or rubs.    Skin: Extremities are without rash or  edema.  Musculoskeletal:  Back is nontender  Neurologic Exam  Mental status: The patient is alert and oriented x 3 at the time of the examination. The patient has apparent normal  recent and remote memory, with an apparently normal attention span and concentration ability.   Speech is normal.  Cranial nerves: Extraocular movements are full. Pupils are equal, round, and reactive to light and accomodation.  There is good facial sensation to soft touch bilaterally.Facial strength is normal.  Trapezius and sternocleidomastoid strength is normal. No  dysarthria is noted.   No obvious hearing deficits are noted.  Motor:  Muscle bulk is normal.   Tone is normal. Strength is  5 / 5 in all 4 extremities.   Sensory: Sensory testing is intact to pinprick, soft touch and vibration sensation in arms and reduced vibration and pinprick sensation in toes but normal at ankles.     Coordination: Cerebellar testing reveals good finger-nose-finger and heel-to-shin bilaterally.  Gait and station: Station is normal.   Gait is normal. Tandem gait is normal. Romberg is negative.   Reflexes: Deep tendon reflexes are symmetric and normal bilaterally.   Plantar responses are flexor.    DIAGNOSTIC DATA (LABS, IMAGING, TESTING) - I reviewed patient records, labs, notes, testing and imaging myself where available.  Lab Results  Component Value Date   WBC 3.3 (L) 09/21/2020   HGB 9.3 (L) 09/21/2020   HCT 31.6 (L) 09/21/2020   MCV 78.2 (L) 09/21/2020   PLT 110 (L) 09/21/2020      Component Value Date/Time   NA 140 09/21/2020 1234   K 3.1 (L) 09/21/2020 1234   CL 98 09/21/2020 1234   CO2 29 09/21/2020 1234   GLUCOSE 116 (H) 09/21/2020 1234   BUN 13 09/21/2020 1234   CREATININE 1.39 (H) 09/21/2020 1234   CREATININE 1.50 (H) 01/06/2012 1612   CALCIUM 8.4 (L) 09/21/2020 1234   PROT 6.5 09/21/2020 1234   ALBUMIN 3.8 09/21/2020 1234   AST 30 09/21/2020 1234   ALT 18 09/21/2020 1234   ALKPHOS 72 09/21/2020 1234   BILITOT 0.5 09/21/2020 1234   GFRNONAA 45 (L) 09/21/2020 1234   GFRAA 52 (L) 09/21/2020 1234   Lab Results  Component Value Date   CHOL 204 (H) 09/22/2012   HDL 57.30 09/22/2012   LDLCALC 68 12/16/2011   LDLDIRECT 135.0 09/22/2012   TRIG 80.0 09/22/2012   CHOLHDL 4 09/22/2012   Lab Results  Component Value Date   HGBA1C 5.3 11/14/2017   Lab Results  Component Value Date   VITAMINB12 125 (L) 11/15/2017   Lab Results  Component Value Date   TSH 1.82 06/14/2013       ASSESSMENT AND PLAN  Seizure disorder (HCC) - Plan:  Ammonia, Magnesium, Comprehensive metabolic panel, EEG adult, MR BRAIN WO CONTRAST  Diabetic polyneuropathy associated with type 2 diabetes mellitus (HCC)  Restless leg - Plan: Ferritin  NASH (nonalcoholic steatohepatitis)   In summary, Ms. [name] is a 49 year old woman who had the first seizure of her life a couple days ago that was unprovoked.  Of note, she is taking bupropion and we discussed that that can lower the seizure threshold.  Therefore, I will have her stop that medication.  She will continue on fluoxetine for depression.  Because the seizure was unprovoked we need to check an EEG to determine if there is any epileptic focus and also check an MRI of the brain (I will do without contrast as she has mild renal insufficiency) to make sure that there is not an abnormality that may reduce her seizure threshold.  Additionally we will check electrolytes including magnesium and have her supplement if needed.  Since she has known  liver disease, I will also check an ammonia level.  A second problem is restless leg/periodic limb movements of sleep.  The gabapentin that she is on could help some and sometimes nortriptyline can make this issue worse.  I will also check her ferritin level as low iron can make restless leg syndrome worse.  Consider adding ropinirole if ferritin is fine or supplementing her if ferritin is low.  She will return to see me as needed based on the results of the tests and call if she has another seizure or other neurologic issue.  Thank you for asking me to see Ms. [name].  Please let me know if I can be of further assistance with her or other patients in the future.    Yoshua Geisinger A. Epimenio FootSater, MD, [facility],FAAN 11/14/2020, 3:17 PM Certified in Neurology, Clinical Neurophysiology, Sleep Medicine and Neuroimaging  [facility] [address], [address] [place], [place] [id number] [phone]

## 2020-11-15 LAB — COMPREHENSIVE METABOLIC PANEL
ALT: 25 IU/L (ref 0–32)
AST: 44 IU/L — ABNORMAL HIGH (ref 0–40)
Albumin/Globulin Ratio: 2 (ref 1.2–2.2)
Albumin: 4.5 g/dL (ref 3.8–4.8)
Alkaline Phosphatase: 118 IU/L (ref 44–121)
BUN/Creatinine Ratio: 9 (ref 9–23)
BUN: 13 mg/dL (ref 6–24)
Bilirubin Total: 0.6 mg/dL (ref 0.0–1.2)
CO2: 30 mmol/L — ABNORMAL HIGH (ref 20–29)
Calcium: 9.3 mg/dL (ref 8.7–10.2)
Chloride: 96 mmol/L (ref 96–106)
Creatinine, Ser: 1.52 mg/dL — ABNORMAL HIGH (ref 0.57–1.00)
GFR calc Af Amer: 46 mL/min/{1.73_m2} — ABNORMAL LOW (ref >59)
GFR calc non Af Amer: 40 mL/min/{1.73_m2} — ABNORMAL LOW (ref >59)
Globulin, Total: 2.3 g/dL (ref 1.5–4.5)
Glucose: 100 mg/dL — ABNORMAL HIGH (ref 65–99)
Potassium: 3.5 mmol/L (ref 3.5–5.2)
Sodium: 142 mmol/L (ref 134–144)
Total Protein: 6.8 g/dL (ref 6.0–8.5)

## 2020-11-15 LAB — FERRITIN: Ferritin: 12 ng/mL — ABNORMAL LOW (ref 15–150)

## 2020-11-15 LAB — AMMONIA: Ammonia: 27 ug/dL — ABNORMAL LOW (ref 31–155)

## 2020-11-15 LAB — MAGNESIUM: Magnesium: 1.8 mg/dL (ref 1.6–2.3)

## 2020-11-18 ENCOUNTER — Telehealth: Payer: Self-pay | Admitting: Neurology

## 2020-11-18 NOTE — Telephone Encounter (Signed)
[employer] order sent to [facility]. They will obtain the auth and reach out to the patient to schedule.  

## 2020-11-19 NOTE — Telephone Encounter (Signed)
Drucie Opitz: [id number] (exp. 11/19/20 to 05/18/21)

## 2020-11-25 ENCOUNTER — Ambulatory Visit (INDEPENDENT_AMBULATORY_CARE_PROVIDER_SITE_OTHER): Payer: No Typology Code available for payment source | Admitting: Neurology

## 2020-11-25 ENCOUNTER — Other Ambulatory Visit: Payer: Self-pay

## 2020-11-25 DIAGNOSIS — G40909 Epilepsy, unspecified, not intractable, without status epilepticus: Secondary | ICD-10-CM

## 2020-11-28 NOTE — Progress Notes (Signed)
   [facility]  EEG (ELECTROENCEPHALOGRAM) REPORT   STUDY DATE: 11/25/2020 PATIENT NAME: [name] DOB: 20-Feb-1971 MRN: [id number]  ORDERING CLINICIAN: Cleveland Yarbro A. Epimenio Foot, MD. PhD  TECHNOLOGIST: Elvis Coil, RPSGT TECHNIQUE: Electroencephalogram was recorded utilizing standard 10-20 system of lead placement and reformatted into average and bipolar montages.  RECORDING TIME: 26 minutes  CLINICAL INFORMATION: 49 year old woman with seizure  FINDINGS: A digital EEG was performed while the patient was awake and drowsy. While awake and most alert there was a 8-9 hz posterior dominant rhythm. Voltages and frequencies were symmetric.  There were no focal, lateralizing, epileptiform activity or seizures seen.  Photic stimulation had a normal driving response. Hyperventilation and recovery did not change the underlying rhythms. EKG channel shows normal sinus rhythm.  The patient became drowsy for short period of time but did not enter definitive sleep.Marland Kitchen  IMPRESSION: This is a normal EEG while the patient was awake and drowsy.   INTERPRETING PHYSICIAN:   Kylia Grajales A. Epimenio Foot, MD, PhD, [facility] Certified in Neurology, Clinical Neurophysiology, Sleep Medicine, Pain Medicine and Neuroimaging  [facility] [address] [place], [place] [address] [phone]

## 2020-12-01 DIAGNOSIS — R079 Chest pain, unspecified: Secondary | ICD-10-CM | POA: Diagnosis not present

## 2020-12-08 ENCOUNTER — Other Ambulatory Visit: Payer: Self-pay

## 2020-12-08 ENCOUNTER — Ambulatory Visit
Admission: RE | Admit: 2020-12-08 | Discharge: 2020-12-08 | Disposition: A | Discharge status: Home / Self Care | Payer: No Typology Code available for payment source | Source: Ambulatory Visit | Attending: Neurology | Admitting: Neurology

## 2020-12-08 DIAGNOSIS — G40909 Epilepsy, unspecified, not intractable, without status epilepticus: Secondary | ICD-10-CM

## 2020-12-25 ENCOUNTER — Encounter: Payer: Self-pay | Admitting: Cardiology

## 2020-12-25 ENCOUNTER — Telehealth: Payer: Self-pay | Admitting: *Deleted

## 2020-12-25 DIAGNOSIS — R9431 Abnormal electrocardiogram [ECG] [EKG]: Secondary | ICD-10-CM | POA: Diagnosis not present

## 2020-12-25 DIAGNOSIS — R55 Syncope and collapse: Secondary | ICD-10-CM | POA: Diagnosis not present

## 2020-12-25 DIAGNOSIS — G40909 Epilepsy, unspecified, not intractable, without status epilepticus: Secondary | ICD-10-CM

## 2020-12-25 DIAGNOSIS — E876 Hypokalemia: Secondary | ICD-10-CM

## 2020-12-25 DIAGNOSIS — E1169 Type 2 diabetes mellitus with other specified complication: Secondary | ICD-10-CM

## 2020-12-25 LAB — PROTIME-INR: INR: 11.3 — AB (ref 0.9–1.1)

## 2020-12-25 NOTE — Telephone Encounter (Signed)
Received call form Laveda Abbe, PA at [facility] ED. She stated patient is there and not able to give them much history. She told them she was put on Lamictal by Dr Epimenio Foot and had another seizure at home today.  I reviewed Dr Bonnita Hollow note, MRI, EEG with her, advised no mention or order of any seizure medications. She asked that office note, MRI, EEG, labs be faxed to them. She is calling hospital neurologist to see patient. I advised will fax over. Amber verbalized understanding, appreciation. Information faxed as requested, made note on cover page that Dr Epimenio Foot is out of office; Dr Frances Furbish is work in [place].

## 2020-12-26 DIAGNOSIS — R55 Syncope and collapse: Secondary | ICD-10-CM | POA: Diagnosis not present

## 2020-12-26 DIAGNOSIS — E876 Hypokalemia: Secondary | ICD-10-CM | POA: Diagnosis not present

## 2020-12-26 DIAGNOSIS — E1169 Type 2 diabetes mellitus with other specified complication: Secondary | ICD-10-CM | POA: Diagnosis not present

## 2020-12-26 DIAGNOSIS — R9431 Abnormal electrocardiogram [ECG] [EKG]: Secondary | ICD-10-CM | POA: Diagnosis not present

## 2020-12-27 DIAGNOSIS — R55 Syncope and collapse: Secondary | ICD-10-CM | POA: Diagnosis not present

## 2020-12-27 DIAGNOSIS — E876 Hypokalemia: Secondary | ICD-10-CM | POA: Diagnosis not present

## 2020-12-27 DIAGNOSIS — R9431 Abnormal electrocardiogram [ECG] [EKG]: Secondary | ICD-10-CM | POA: Diagnosis not present

## 2020-12-27 DIAGNOSIS — E1169 Type 2 diabetes mellitus with other specified complication: Secondary | ICD-10-CM | POA: Diagnosis not present

## 2020-12-30 NOTE — Telephone Encounter (Signed)
On 12/26/20 I faxed request for ED notes, results of any tests for Dr Epimenio Foot to review. Have not received notes, faxed request again.

## 2020-12-30 NOTE — Telephone Encounter (Signed)
Received notes from [facility]. Given to Dr Epimenio Foot for review.

## 2020-12-31 ENCOUNTER — Telehealth: Payer: Self-pay | Admitting: Cardiology

## 2020-12-31 NOTE — Telephone Encounter (Signed)
Spoke to the patient just now and got her scheduled to come into the office tomorrow to get this holter monitor placed as these are not mailed to the patient. She verbalizes understanding and thanks me for the call back.

## 2020-12-31 NOTE — Telephone Encounter (Signed)
I saw her in the office.  Please review my records from [facility].  Yes she can get a 30-day appointment in follow-up to see me after that in about 2 weeks after the monitor is done.

## 2020-12-31 NOTE — Telephone Encounter (Signed)
New Message:     Pt is calling to find out about getting her 30 days Monitor. It was on her hospital discharge summary. I do not see an order.

## 2021-01-01 ENCOUNTER — Ambulatory Visit (INDEPENDENT_AMBULATORY_CARE_PROVIDER_SITE_OTHER): Payer: No Typology Code available for payment source

## 2021-01-01 ENCOUNTER — Other Ambulatory Visit: Payer: Self-pay

## 2021-01-01 ENCOUNTER — Ambulatory Visit: Payer: No Typology Code available for payment source | Admitting: Cardiology

## 2021-01-01 ENCOUNTER — Ambulatory Visit: Payer: No Typology Code available for payment source

## 2021-01-01 DIAGNOSIS — R55 Syncope and collapse: Secondary | ICD-10-CM | POA: Diagnosis not present

## 2021-01-02 ENCOUNTER — Other Ambulatory Visit: Payer: Self-pay

## 2021-01-02 ENCOUNTER — Other Ambulatory Visit: Payer: Self-pay | Admitting: *Deleted

## 2021-01-02 DIAGNOSIS — R55 Syncope and collapse: Secondary | ICD-10-CM

## 2021-01-15 ENCOUNTER — Ambulatory Visit: Payer: No Typology Code available for payment source | Admitting: Cardiology

## 2021-02-17 ENCOUNTER — Other Ambulatory Visit: Payer: Self-pay

## 2021-02-17 DIAGNOSIS — E1169 Type 2 diabetes mellitus with other specified complication: Secondary | ICD-10-CM | POA: Insufficient documentation

## 2021-02-17 DIAGNOSIS — R319 Hematuria, unspecified: Secondary | ICD-10-CM | POA: Insufficient documentation

## 2021-02-17 DIAGNOSIS — I872 Venous insufficiency (chronic) (peripheral): Secondary | ICD-10-CM | POA: Insufficient documentation

## 2021-02-17 DIAGNOSIS — R600 Localized edema: Secondary | ICD-10-CM | POA: Insufficient documentation

## 2021-02-17 DIAGNOSIS — I809 Phlebitis and thrombophlebitis of unspecified site: Secondary | ICD-10-CM | POA: Insufficient documentation

## 2021-02-17 DIAGNOSIS — R630 Anorexia: Secondary | ICD-10-CM | POA: Insufficient documentation

## 2021-02-17 DIAGNOSIS — Z973 Presence of spectacles and contact lenses: Secondary | ICD-10-CM | POA: Insufficient documentation

## 2021-02-17 DIAGNOSIS — H538 Other visual disturbances: Secondary | ICD-10-CM | POA: Insufficient documentation

## 2021-02-17 DIAGNOSIS — N189 Chronic kidney disease, unspecified: Secondary | ICD-10-CM | POA: Insufficient documentation

## 2021-02-17 DIAGNOSIS — R519 Headache, unspecified: Secondary | ICD-10-CM | POA: Insufficient documentation

## 2021-02-17 DIAGNOSIS — R5383 Other fatigue: Secondary | ICD-10-CM | POA: Insufficient documentation

## 2021-02-17 DIAGNOSIS — N390 Urinary tract infection, site not specified: Secondary | ICD-10-CM | POA: Insufficient documentation

## 2021-02-17 DIAGNOSIS — R6883 Chills (without fever): Secondary | ICD-10-CM | POA: Insufficient documentation

## 2021-02-17 DIAGNOSIS — N83209 Unspecified ovarian cyst, unspecified side: Secondary | ICD-10-CM | POA: Insufficient documentation

## 2021-02-17 DIAGNOSIS — K589 Irritable bowel syndrome without diarrhea: Secondary | ICD-10-CM | POA: Insufficient documentation

## 2021-02-17 DIAGNOSIS — F418 Other specified anxiety disorders: Secondary | ICD-10-CM | POA: Insufficient documentation

## 2021-02-17 DIAGNOSIS — E785 Hyperlipidemia, unspecified: Secondary | ICD-10-CM | POA: Insufficient documentation

## 2021-02-17 DIAGNOSIS — R42 Dizziness and giddiness: Secondary | ICD-10-CM | POA: Insufficient documentation

## 2021-02-17 DIAGNOSIS — K769 Liver disease, unspecified: Secondary | ICD-10-CM | POA: Insufficient documentation

## 2021-02-17 DIAGNOSIS — R634 Abnormal weight loss: Secondary | ICD-10-CM | POA: Insufficient documentation

## 2021-02-18 ENCOUNTER — Other Ambulatory Visit: Payer: Self-pay

## 2021-02-18 ENCOUNTER — Ambulatory Visit (INDEPENDENT_AMBULATORY_CARE_PROVIDER_SITE_OTHER): Payer: No Typology Code available for payment source | Admitting: Cardiology

## 2021-02-18 ENCOUNTER — Encounter: Payer: Self-pay | Admitting: Cardiology

## 2021-02-18 VITALS — BP 118/80 | HR 86 | Ht 64.0 in | Wt 193.0 lb

## 2021-02-18 DIAGNOSIS — E669 Obesity, unspecified: Secondary | ICD-10-CM

## 2021-02-18 DIAGNOSIS — E876 Hypokalemia: Secondary | ICD-10-CM

## 2021-02-18 DIAGNOSIS — R55 Syncope and collapse: Secondary | ICD-10-CM

## 2021-02-18 DIAGNOSIS — R9431 Abnormal electrocardiogram [ECG] [EKG]: Secondary | ICD-10-CM

## 2021-02-18 DIAGNOSIS — E1169 Type 2 diabetes mellitus with other specified complication: Secondary | ICD-10-CM | POA: Diagnosis not present

## 2021-02-18 DIAGNOSIS — E782 Mixed hyperlipidemia: Secondary | ICD-10-CM | POA: Diagnosis not present

## 2021-02-18 DIAGNOSIS — E785 Hyperlipidemia, unspecified: Secondary | ICD-10-CM

## 2021-02-18 DIAGNOSIS — Z9884 Bariatric surgery status: Secondary | ICD-10-CM | POA: Diagnosis not present

## 2021-02-18 DIAGNOSIS — E875 Hyperkalemia: Secondary | ICD-10-CM

## 2021-02-18 DIAGNOSIS — E66811 Obesity, class 1: Secondary | ICD-10-CM

## 2021-02-18 DIAGNOSIS — I4581 Long QT syndrome: Secondary | ICD-10-CM

## 2021-02-18 HISTORY — DX: Obesity, unspecified: E66.9

## 2021-02-18 HISTORY — DX: Long QT syndrome: I45.81

## 2021-02-18 HISTORY — DX: Obesity, class 1: E66.811

## 2021-02-18 LAB — BASIC METABOLIC PANEL
BUN/Creatinine Ratio: 15 (ref 9–23)
BUN: 18 mg/dL (ref 6–24)
CO2: 28 mmol/L (ref 20–29)
Calcium: 9.1 mg/dL (ref 8.7–10.2)
Chloride: 98 mmol/L (ref 96–106)
Creatinine, Ser: 1.23 mg/dL — ABNORMAL HIGH (ref 0.57–1.00)
GFR calc Af Amer: 60 mL/min/{1.73_m2} (ref >59)
GFR calc non Af Amer: 52 mL/min/{1.73_m2} — ABNORMAL LOW (ref >59)
Glucose: 97 mg/dL (ref 65–99)
Potassium: 3.4 mmol/L — ABNORMAL LOW (ref 3.5–5.2)
Sodium: 141 mmol/L (ref 134–144)

## 2021-02-18 LAB — MAGNESIUM: Magnesium: 1.9 mg/dL (ref 1.6–2.3)

## 2021-02-18 NOTE — Patient Instructions (Signed)
Medication Instructions:  No medication changes. *If you need a refill on your cardiac medications before your next appointment, please call your pharmacy*   Lab Work: Your physician recommends that you have labs done in the office today. Your test included  basic metabolic panel and magnesium.  If you have labs (blood work) drawn today and your tests are completely normal, you will receive your results only by: . MyChart Message (if you have MyChart) OR . A paper copy in the mail If you have any lab test that is abnormal or we need to change your treatment, we will call you to review the results.   Testing/Procedures: None ordered   Follow-Up: At [facility], you and your health needs are our priority.  As part of our continuing mission to provide you with exceptional heart care, we have created designated Provider Care Teams.  These Care Teams include your primary Cardiologist (physician) and Advanced Practice Providers (APPs -  Physician Assistants and Nurse Practitioners) who all work together to provide you with the care you need, when you need it.  We recommend signing up for the patient portal called "MyChart".  Sign up information is provided on this After Visit Summary.  MyChart is used to connect with patients for Virtual Visits (Telemedicine).  Patients are able to view lab/test results, encounter notes, upcoming appointments, etc.  Non-urgent messages can be sent to your provider as well.   To learn more about what you can do with MyChart, go to [email or web address].    Your next appointment:   6 month(s)  The format for your next appointment:   In Person  Provider:   Rajan Revankar, MD   Other Instructions NA  

## 2021-02-18 NOTE — Progress Notes (Signed)
Cardiology Office Note:    Date:  02/18/2021   ID:  [name], DOB Jan 08, 1971, MRN [id number]  PCP:  Street, Stephanie Coup, MD  Cardiologist:  Garwin Brothers, MD   Referring MD: [address], Stephanie Coup, *    ASSESSMENT:    1. Mixed hyperlipidemia   2. S/P gastric bypass   3. Type 2 diabetes mellitus with hyperlipidemia (HCC)   4. Obesity (BMI 30.0-34.9)   5. Prolonged QT interval   6. Syncope and collapse    PLAN:    In order of problems listed above:  1. Prolonged QT: Syncopal spells: I discussed my findings with the patient extensively.  Blood work was reviewed.  She will have a Chem-7 and magnesium level again today.  In view of the fact that she has had multiple episodes of syncope and had a seizure evaluation by neurologist, negative I would like her to refer to our electrophysiology colleagues.  She might be considered for a Linq loop recorder to understand her symptoms to see if there is any connection with arrhythmias given her abnormal EKG.  She is agreeable.  She knows to go to the nearest emergency room for any concerning symptoms.  Event monitor report was discussed with her at length. 2. Essential hypertension: Blood pressure stable and diet was emphasized. 3. Mixed dyslipidemia and diabetes mellitus: Diet was emphasized.  Importance of regular exercise stressed and she fully promises to do better. 4. Obesity: Post gastric bypass surgery: She is doing better with diet and exercise and risks of obesity explained. 5. Patient will be seen in follow-up appointment in 6 months or earlier if the patient has any concerns    Medication Adjustments/Labs and Tests Ordered: Current medicines are reviewed at length with the patient today.  Concerns regarding medicines are outlined above.  Orders Placed This Encounter  Procedures  . Basic metabolic panel  . Magnesium  . Ambulatory referral to Cardiac Electrophysiology   No orders of the defined types were placed in this  encounter.    No chief complaint on file.    History of Present Illness:    [name] is a 50 y.o. female.  Patient has past medical history of essential hypertension dyslipidemia diabetes mellitus.  She has history of obesity.  She has prolonged QT.  She has had syncopal episodes in the past.  She has been evaluated by neurology and she has been told that it is more seizures.  She denies any chest pain orthopnea or PND.  I reviewed her monitor and she has had multiple episodes of palpitations and flutter-like sensation and most of these are accompanied by sinus tachycardia.  At the time of my evaluation, the patient is alert awake oriented and in no distress.  Past Medical History:  Diagnosis Date  . Abdominal pain   . Abdominal pain 09/06/2012  . Abnormal mammogram of left breast 07/02/2016   Formatting of this note might be different from the original. Left posterior upper-outer quadrant 1.5cm asymmetry with decreased attenuation and conspicuity. BIRAD 3. Recommendation repeat diagnostic mammogram in 1 year  . Adnexal cyst 07/13/2013  . AKI (acute kidney injury) (HCC) 11/14/2017  . Anxiety 01/25/2015  . Appendicitis 06/21/2013  . Blood in urine   . Blurred vision    when feeling very fatigued   . Chills   . 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 Reports biopsy with NAFLD  . CKD (chronic kidney  disease)   . Cold sore 12/16/2011  . Depression 08/19/2011  . Depression with anxiety   . Diabetes mellitus   . Diabetes mellitus (HCC) 06/01/2011  . Diabetic polyneuropathy associated with type 2 diabetes mellitus (HCC) 11/14/2020  . Dizziness   . Edema of both lower legs due to peripheral venous insufficiency   . 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 Fatigue    loss of sleep  . Gastric bypass status for obesity 02/28/2014  . Gastroesophageal reflux disease without esophagitis 01/27/2015  . Generalized headaches   . H/O type 2 diabetes  mellitus 08/03/2013  . Herpes simplex 12/16/2011   Formatting of this note might be different from the original. Last Assessment & Plan:  New.  Start Valtrex to improve sxs.  Marland Kitchen History of Roux-en-Y gastric bypass   . Hoarseness 06/14/2013  . Hormone replacement therapy, postmenopausal 06/17/2016  . Hx of migraines 08/03/2013  . Hyperkalemia 11/14/2017  . Hyperlipidemia   . IBS (irritable bowel syndrome)   . Insomnia 01/28/2016  . Kidney stones   . Liver disease   . Lumbar paraspinal muscle spasm 02/06/2013  . Major depressive disorder, single episode 08/03/2013  . Metabolic acidosis 11/14/2017  . Mixed insomnia   . NASH (nonalcoholic steatohepatitis) 11/14/2020  . Nephrolithiasis 01/28/2016   Formatting of this note might be different from the original. S/p lithotripsy and ureteroscopy. On allopurinol and potassium citrate for stone prevention  . Non-alcoholic fatty liver disease   . Obesity 01/17/2014  . Obesity (BMI 35.0-39.9 without comorbidity) 01/17/2014  . Ovarian cyst   . 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 Paresthesia 06/14/2013  . Pelvic pain in female 06/24/2011  . Pericardial effusion 11/29/2012  . Peripheral edema 04/27/2016  . Poor appetite   . Recurrent kidney stones 05/09/2013  . Recurrent UTI   . Restless leg 11/14/2020  . S/P gastric bypass 06/01/2011  . Seizure disorder (HCC) 11/14/2020  . Small bowel intussusception (HCC) 01/17/2014  . Superficial postoperative wound infection 08/11/2013  . Syncope 05/09/2013  . Tension headache 05/09/2013  . Thrombophlebitis   . Type 2 diabetes mellitus with hyperlipidemia (HCC)   . Type 2 diabetes mellitus without complications (HCC) 06/01/2011  . Vitamin D deficiency   . Wears glasses   . Weight decrease     Past Surgical History:  Procedure Laterality Date  . ABDOMINAL HYSTERECTOMY  2008   for polycystic ovaries  . APPENDECTOMY    . BILATERAL SALPINGOOPHORECTOMY  2012   Dr Tenny Craw  . CHOLECYSTECTOMY  2005  . COLONOSCOPY      negative, done for pain  . CYSTOSTOMY W/ BLADDER BIOPSY    . EXPLORATORY LAPAROTOMY  09/01/11  . GASTRIC BYPASS  10/2010  . kidney stones    . LAPAROSCOPY  10/19/2011   Procedure: LAPAROSCOPY OPERATIVE;  Surgeon: Miguel Aschoff;  Location: [facility];  Service: Gynecology;  Laterality: N/A;  Operative Laparoscopy with Lysis Of Adhesions  . LAPAROTOMY  10/19/2011   Procedure: LAPAROTOMY;  Surgeon: Miguel Aschoff;  Location: [facility];  Service: Gynecology;  Laterality: Bilateral;  Laparotomy With Bilateral Oophorectomy  . TUBAL LIGATION     x3  . UPPER GASTROINTESTINAL ENDOSCOPY     to assess varices    Current Medications: Current Meds  Medication Sig  . allopurinol (ZYLOPRIM) 100 MG tablet Take 100 mg by mouth daily.  Marland Kitchen ALPRAZolam (XANAX) 1 MG tablet Take 1 mg by mouth 3 (three) times daily as needed for anxiety.  Marland Kitchen atorvastatin (LIPITOR) 40 MG tablet Take  40 mg at bedtime by mouth.  . estradiol (ESTRACE) 1 MG tablet Take 1 mg by mouth daily.  Marland Kitchen FLUoxetine (PROZAC) 20 MG capsule Take 40 mg by mouth every morning.  . furosemide (LASIX) 20 MG tablet Take 20 mg by mouth daily.  Marland Kitchen gabapentin (NEURONTIN) 300 MG capsule Take 300 mg by mouth 2 (two) times daily.  . potassium chloride (K-DUR) 10 MEQ tablet Take 10 mEq by mouth 2 (two) times daily.  . TRULICITY 0.75 MG/0.5ML SOPN Inject 0.75 mg every Thursday into the skin.     Allergies:   Patient has no known allergies.   Social History   Socioeconomic History  . Marital status: Married    Spouse name: Not on file  . Number of children: 4  . Years of education: GED/community college  . Highest education level: Not on file  Occupational History  . Occupation: [employer]  Tobacco Use  . Smoking status: Never Smoker  . Smokeless tobacco: Never Used  Substance and Sexual Activity  . Alcohol use: No    Comment: never  . Drug use: No  . Sexual activity: Yes    Birth control/protection: Surgical, Implant  Other Topics Concern  . Not on file   Social History Narrative   Lives   Caffeine use:    Right handed    Social Determinants of Health   Financial Resource Strain: Not on file  Food Insecurity: Not on file  Transportation Needs: Not on file  Physical Activity: Not on file  Stress: Not on file  Social Connections: Not on file     Family History: The patient's family history includes Cancer in her mother; Colon cancer in her father; Crohn's disease in her father; Diabetes in her father; Heart disease in her father; Hyperlipidemia in her father; Hypertension in her brother and father; Lung cancer in her mother; Other in her brother; Stroke in her father.  ROS:   Please see the history of present illness.    All other systems reviewed and are negative.  EKGs/Labs/Other Studies Reviewed:    The following studies were reviewed today: I reviewed [facility] hospital records extensively.  EKG done today reveals prolonged QT.   Recent Labs: 09/21/2020: Hemoglobin 9.3; Platelets 110 11/14/2020: ALT 25; BUN 13; Creatinine, Ser 1.52; Magnesium 1.8; Potassium 3.5; Sodium 142  Recent Lipid Panel    Component Value Date/Time   CHOL 204 (H) 09/22/2012 1601   TRIG 80.0 09/22/2012 1601   HDL 57.30 09/22/2012 1601   CHOLHDL 4 09/22/2012 1601   VLDL 16.0 09/22/2012 1601   LDLCALC 68 12/16/2011 1438   LDLDIRECT 135.0 09/22/2012 1601    Physical Exam:    VS:  BP 118/80   Pulse 86   Ht 5\' 4"  (1.626 m)   Wt 193 lb (87.5 kg)   SpO2 95%   BMI 33.13 kg/m     Wt Readings from Last 3 Encounters:  02/18/21 193 lb (87.5 kg)  11/14/20 193 lb (87.5 kg)  09/05/20 225 lb (102.1 kg)     GEN: Patient is in no acute distress HEENT: Normal NECK: No JVD; No carotid bruits LYMPHATICS: No lymphadenopathy CARDIAC: Hear sounds regular, 2/6 systolic murmur at the apex. RESPIRATORY:  Clear to auscultation without rales, wheezing or rhonchi  ABDOMEN: Soft, non-tender, non-distended MUSCULOSKELETAL:  No edema; No deformity  SKIN: Warm  and dry NEUROLOGIC:  Alert and oriented x 3 PSYCHIATRIC:  Normal affect   Signed, 11/05/20, MD  02/18/2021 10:34 AM  [facility]

## 2021-02-18 NOTE — Addendum Note (Signed)
Addended by: Eleonore Chiquito on: 02/18/2021 04:31 PM   Modules accepted: Orders

## 2021-02-19 NOTE — Addendum Note (Signed)
Addended by: Eleonore Chiquito on: 02/19/2021 02:09 PM   Modules accepted: Orders

## 2021-02-28 DIAGNOSIS — D509 Iron deficiency anemia, unspecified: Secondary | ICD-10-CM

## 2021-02-28 HISTORY — DX: Iron deficiency anemia, unspecified: D50.9

## 2021-02-28 LAB — BASIC METABOLIC PANEL
BUN/Creatinine Ratio: 10 (ref 9–23)
BUN: 13 mg/dL (ref 6–24)
CO2: 31 mmol/L — ABNORMAL HIGH (ref 20–29)
Calcium: 8.9 mg/dL (ref 8.7–10.2)
Chloride: 90 mmol/L — ABNORMAL LOW (ref 96–106)
Creatinine, Ser: 1.24 mg/dL — ABNORMAL HIGH (ref 0.57–1.00)
Glucose: 145 mg/dL — ABNORMAL HIGH (ref 65–99)
Potassium: 3.3 mmol/L — ABNORMAL LOW (ref 3.5–5.2)
Sodium: 138 mmol/L (ref 134–144)
eGFR: 54 mL/min/{1.73_m2} — ABNORMAL LOW (ref >59)

## 2021-03-03 MED ORDER — POTASSIUM CHLORIDE ER 20 MEQ PO TBCR: 20.0000 meq | EXTENDED_RELEASE_TABLET | Freq: Two times a day (BID) | ORAL | 3 refills | Status: DC

## 2021-03-03 NOTE — Addendum Note (Signed)
Addended by: Eleonore Chiquito on: 03/03/2021 11:04 AM   Modules accepted: Orders

## 2021-03-14 DIAGNOSIS — F332 Major depressive disorder, recurrent severe without psychotic features: Secondary | ICD-10-CM

## 2021-03-14 HISTORY — DX: Major depressive disorder, recurrent severe without psychotic features: F33.2

## 2021-03-17 DIAGNOSIS — K7030 Alcoholic cirrhosis of liver without ascites: Secondary | ICD-10-CM

## 2021-03-17 DIAGNOSIS — N183 Chronic kidney disease, stage 3 unspecified: Secondary | ICD-10-CM

## 2021-03-17 HISTORY — DX: Alcoholic cirrhosis of liver without ascites: K70.30

## 2021-03-17 HISTORY — DX: Chronic kidney disease, stage 3 unspecified: N18.30

## 2021-03-18 DIAGNOSIS — F1020 Alcohol dependence, uncomplicated: Secondary | ICD-10-CM

## 2021-03-18 HISTORY — DX: Alcohol dependence, uncomplicated: F10.20

## 2021-03-25 ENCOUNTER — Other Ambulatory Visit: Payer: Self-pay | Admitting: Cardiology

## 2021-03-25 NOTE — Telephone Encounter (Signed)
Refill sent to pharmacy.   

## 2021-04-14 ENCOUNTER — Encounter: Payer: Self-pay | Admitting: Cardiology

## 2021-04-14 ENCOUNTER — Other Ambulatory Visit: Payer: Self-pay

## 2021-04-14 ENCOUNTER — Ambulatory Visit (INDEPENDENT_AMBULATORY_CARE_PROVIDER_SITE_OTHER): Payer: No Typology Code available for payment source | Admitting: Cardiology

## 2021-04-14 VITALS — BP 118/68 | HR 74 | Ht 64.0 in | Wt 200.2 lb

## 2021-04-14 DIAGNOSIS — R9431 Abnormal electrocardiogram [ECG] [EKG]: Secondary | ICD-10-CM | POA: Diagnosis not present

## 2021-04-14 DIAGNOSIS — R0602 Shortness of breath: Secondary | ICD-10-CM

## 2021-04-14 MED ORDER — NADOLOL 20 MG PO TABS: 20.0000 mg | ORAL_TABLET | Freq: Every day | ORAL | 3 refills | Status: DC

## 2021-04-14 NOTE — Patient Instructions (Addendum)
Medication Instructions:  Your physician has recommended you make the following change in your medication:  1. START Nadolol 20 mg once daily  *If you need a refill on your cardiac medications before your next appointment, please call your pharmacy*   Lab Work: BNP today If you have labs (blood work) drawn today and your tests are completely normal, you will receive your results only by: Marland Kitchen MyChart Message (if you have MyChart) OR . A paper copy in the mail If you have any lab test that is abnormal or we need to change your treatment, we will call you to review the results.   Testing/Procedures: None ordered   Follow-Up: At [facility], you and your health needs are our priority.  As part of our continuing mission to provide you with exceptional heart care, we have created designated Provider Care Teams.  These Care Teams include your primary Cardiologist (physician) and Advanced Practice Providers (APPs -  Physician Assistants and Nurse Practitioners) who all work together to provide you with the care you need, when you need it.  Your next appointment:   3 month(s)  The format for your next appointment:   In Person  Provider:   Loman Brooklyn, MD    Thank you for choosing [facility]!!   Dory Horn, RN [phone]   Other Instructions  Please discuss your Zoloft prescription with the ordering provider.  Discuss alternative medication as Zoloft can prolong QT.  Nadolol Tablets What is this medicine? NADOLOL (nay DOE lole) is a beta blocker. It decreases the amount of work your heart has to do and helps your heart beat regularly. It treats high blood pressure and/or prevent chest pain (also called angina). This medicine may be used for other purposes; ask your health care provider or pharmacist if you have questions. COMMON BRAND NAME(S): Corgard What should I tell my health care provider before I take this medicine? They need to know if you have any of these  conditions:  diabetes  heart or vessel disease like slow heart rate, worsening heart failure, heart block, sick sinus syndrome or Raynaud's disease  kidney disease  lung or breathing disease, like asthma or emphysema  pheochromocytoma  thyroid disease  an unusual or allergic reaction to nadolol, other beta-blockers, medicines, foods, dyes, or preservatives  pregnant or trying to get pregnant  breast-feeding How should I use this medicine? Take this drug by mouth. Take it as directed on the prescription label at the same time every day. You can take it with or without food. If it upsets your stomach, take it with food. Keep taking it unless your health care provider tells you to stop. Talk to your health care provider about the use of this drug in children. Special care may be needed. Overdosage: If you think you have taken too much of this medicine contact a poison control center or emergency room at once. NOTE: This medicine is only for you. Do not share this medicine with others. What if I miss a dose? If you miss a dose, take it as soon as you can. If it is almost time for your next dose, take only that dose. Do not take double or extra doses. What may interact with this medicine? This medicine may interact with the following medications:  certain medicines for blood pressure, heart disease, irregular heart beat  certain medicines for diabetes, like glipizide or glyburide  general anesthetics  medicines used to treat allergic reactions like epinephrine This list may  not describe all possible interactions. Give your health care provider a list of all the medicines, herbs, non-prescription drugs, or dietary supplements you use. Also tell them if you smoke, drink alcohol, or use illegal drugs. Some items may interact with your medicine. What should I watch for while using this medicine? Visit your doctor or health care professional for regular check ups. Check your blood  pressure and pulse rate regularly. Ask your health care professional what your blood pressure and pulse rate should be, and when you should contact them. You may get drowsy or dizzy. Do not drive, use machinery, or do anything that needs mental alertness until you know how this drug affects you. Do not stand or sit up quickly, especially if you are an older patient. This reduces the risk of dizzy or fainting spells. Alcohol can make you more drowsy and dizzy. Avoid alcoholic drinks. This medicine may increase blood sugar. Ask your healthcare provider if changes in diet or medicines are needed if you have diabetes. Do not treat yourself for coughs, colds, or pain while you are taking this medicine without asking your doctor or health care professional for advice. Some ingredients may increase your blood pressure. What side effects may I notice from receiving this medicine? Side effects that you should report to your doctor or health care professional as soon as possible:  allergic reactions like skin rash, itching or hives  cold, tingling, or numb hands or feet  difficulty breathing, wheezing  irregular heart beat  mental depression   signs and symptoms of high blood sugar such as being more thirsty or hungry or having to urinate more than normal. You may also feel very tired or have blurry vision.  slow heart rate  swelling of the legs or ankles Side effects that usually do not require medical attention (report to your doctor or health care professional if they continue or are bothersome):  change in sex drive or performance  dry itching skin  headache This list may not describe all possible side effects. Call your doctor for medical advice about side effects. You may report side effects to FDA at 1-800-FDA-1088. Where should I keep my medicine? Keep out of the reach of children and pets. Store at room temperature between 20 and 25 degrees C (68 and 77 degrees F). Protect from light.  Keep the container tightly closed. Avoid exposure to extreme heat. Throw away any unused drug after the expiration date. NOTE: This sheet is a summary. It may not cover all possible information. If you have questions about this medicine, talk to your doctor, pharmacist, or health care provider.  2021 Elsevier/Gold Standard (2019-07-21 18:20:05)

## 2021-04-14 NOTE — Progress Notes (Signed)
Electrophysiology Office Note   Date:  04/14/2021   ID:  [name], DOB Aug 23, 1971, MRN [id number]  PCP:  Street, Stephanie Coup, MD  Cardiologist:  Revankar Primary Electrophysiologist:  Lakera Viall Jorja Loa, MD    Chief Complaint: syncope   History of Present Illness: [name] is a 50 y.o. female who is being seen today for the evaluation of syncope at the request of Revankar, Aundra Dubin, MD. Presenting today for electrophysiology evaluation.  She has a past history significant for hypertension, hyperlipidemia, diabetes, obesity status post gastric bypass.  She had a syncopal episodes in the past.  She denies chest pain, orthopnea, PND.  Today, she denies symptoms of palpitations, chest pain, shortness of breath, PND, lower extremity edema, claudication, dizziness, presyncope, bleeding, or neurologic sequela. The patient is tolerating medications without difficulties.  She has had 2 episodes of syncope in the last 6 months.  Prior to that, both her and her husband had a particularly bad episode of COVID.  Her first episode she was at the flea market walking around with her husband.  She stated that she felt bad and had an episode of syncope.  She would did not change position.  She woke up with the paramedics around her.  She was told by the emergency room and by the paramedics that she had a seizure.  Her second episode occurred 3 months after that.  She was walking around her house.  She called her husband and told him to call 911.  She had the counter and had a cut on her forehead.  She felt poorly prior to that.  She is unclear whether or not she had palpitations.  She was evaluated by neurology who apparently did an EEG and told her that she did not have any evidence of seizures.   Past Medical History:  Diagnosis Date  . Abdominal pain   . Abdominal pain 09/06/2012  . Abnormal mammogram of left breast 07/02/2016   Formatting of this note might be different from the original.  Left posterior upper-outer quadrant 1.5cm asymmetry with decreased attenuation and conspicuity. BIRAD 3. Recommendation repeat diagnostic mammogram in 1 year  . Adnexal cyst 07/13/2013  . AKI (acute kidney injury) (HCC) 11/14/2017  . Anxiety 01/25/2015  . Appendicitis 06/21/2013  . Blood in urine   . Blurred vision    when feeling very fatigued   . Chills   . 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 Reports biopsy with NAFLD  . CKD (chronic kidney disease)   . Cold sore 12/16/2011  . Depression 08/19/2011  . Depression with anxiety   . Diabetes mellitus   . Diabetes mellitus (HCC) 06/01/2011  . Diabetic polyneuropathy associated with type 2 diabetes mellitus (HCC) 11/14/2020  . Dizziness   . Edema of both lower legs due to peripheral venous insufficiency   . 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 Fatigue    loss of sleep  . Gastric bypass status for obesity 02/28/2014  . Gastroesophageal reflux disease without esophagitis 01/27/2015  . Generalized headaches   . H/O type 2 diabetes mellitus 08/03/2013  . Herpes simplex 12/16/2011   Formatting of this note might be different from the original. Last Assessment & Plan:  New.  Start Valtrex to improve sxs.  Marland Kitchen History of Roux-en-Y gastric bypass   . Hoarseness 06/14/2013  . Hormone replacement therapy, postmenopausal 06/17/2016  . Hx of migraines 08/03/2013  . Hyperkalemia 11/14/2017  .  Hyperlipidemia   . IBS (irritable bowel syndrome)   . Insomnia 01/28/2016  . Kidney stones   . Liver disease   . Lumbar paraspinal muscle spasm 02/06/2013  . Major depressive disorder, single episode 08/03/2013  . Metabolic acidosis 11/14/2017  . Mixed insomnia   . NASH (nonalcoholic steatohepatitis) 11/14/2020  . Nephrolithiasis 01/28/2016   Formatting of this note might be different from the original. S/p lithotripsy and ureteroscopy. On allopurinol and potassium citrate for stone prevention  . Non-alcoholic  fatty liver disease   . Obesity 01/17/2014  . Obesity (BMI 35.0-39.9 without comorbidity) 01/17/2014  . Ovarian cyst   . 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 Paresthesia 06/14/2013  . Pelvic pain in female 06/24/2011  . Pericardial effusion 11/29/2012  . Peripheral edema 04/27/2016  . Poor appetite   . Recurrent kidney stones 05/09/2013  . Recurrent UTI   . Restless leg 11/14/2020  . S/P gastric bypass 06/01/2011  . Seizure disorder (HCC) 11/14/2020  . Small bowel intussusception (HCC) 01/17/2014  . Superficial postoperative wound infection 08/11/2013  . Syncope 05/09/2013  . Tension headache 05/09/2013  . Thrombophlebitis   . Type 2 diabetes mellitus with hyperlipidemia (HCC)   . Type 2 diabetes mellitus without complications (HCC) 06/01/2011  . Vitamin D deficiency   . Wears glasses   . Weight decrease    Past Surgical History:  Procedure Laterality Date  . ABDOMINAL HYSTERECTOMY  2008   for polycystic ovaries  . APPENDECTOMY    . BILATERAL SALPINGOOPHORECTOMY  2012   Dr Tenny Craw  . CHOLECYSTECTOMY  2005  . COLONOSCOPY     negative, done for pain  . CYSTOSTOMY W/ BLADDER BIOPSY    . EXPLORATORY LAPAROTOMY  09/01/11  . GASTRIC BYPASS  10/2010  . kidney stones    . LAPAROSCOPY  10/19/2011   Procedure: LAPAROSCOPY OPERATIVE;  Surgeon: Miguel Aschoff;  Location: [facility];  Service: Gynecology;  Laterality: N/A;  Operative Laparoscopy with Lysis Of Adhesions  . LAPAROTOMY  10/19/2011   Procedure: LAPAROTOMY;  Surgeon: Miguel Aschoff;  Location: [facility];  Service: Gynecology;  Laterality: Bilateral;  Laparotomy With Bilateral Oophorectomy  . TUBAL LIGATION     x3  . UPPER GASTROINTESTINAL ENDOSCOPY     to assess varices     Current Outpatient Medications  Medication Sig Dispense Refill  . allopurinol (ZYLOPRIM) 100 MG tablet Take 100 mg by mouth daily.    Marland Kitchen ALPRAZolam (XANAX) 1 MG tablet Take 1 mg by mouth 3 (three) times daily as needed for anxiety.    Marland Kitchen atorvastatin (LIPITOR) 40 MG tablet Take  40 mg at bedtime by mouth.  3  . estradiol (ESTRACE) 1 MG tablet Take 1 mg by mouth daily.    . furosemide (LASIX) 20 MG tablet Take 20 mg by mouth daily.    Marland Kitchen gabapentin (NEURONTIN) 300 MG capsule Take 300 mg by mouth 2 (two) times daily.    . nadolol (CORGARD) 20 MG tablet Take 1 tablet (20 mg total) by mouth daily. 30 tablet 3  . Potassium Chloride ER 20 MEQ TBCR TAKE 1 TABLET TWICE A DAY 90 tablet 4  . sertraline (ZOLOFT) 25 MG tablet Take 25 mg by mouth daily.    . TRULICITY 0.75 MG/0.5ML SOPN Inject 0.75 mg every Thursday into the skin.  0   No current facility-administered medications for this visit.    Allergies:   Patient has no known allergies.   Social History:  The patient  reports that she has  never smoked. She has never used smokeless tobacco. She reports that she does not drink alcohol and does not use drugs.   Family History:  The patient's family history includes Cancer in her mother; Colon cancer in her father; Crohn's disease in her father; Diabetes in her father; Heart disease in her father; Hyperlipidemia in her father; Hypertension in her brother and father; Lung cancer in her mother; Other in her brother; Stroke in her father.    ROS:  Please see the history of present illness.   Otherwise, review of systems is positive for none.   All other systems are reviewed and negative.    PHYSICAL EXAM: VS:  BP 118/68   Pulse 74   Ht 5\' 4"  (1.626 m)   Wt 200 lb 3.2 oz (90.8 kg)   BMI 34.36 kg/m  , BMI Body mass index is 34.36 kg/m. GEN: Well nourished, well developed, in no acute distress  HEENT: normal  Neck: no JVD, carotid bruits, or masses Cardiac: RRR; no murmurs, rubs, or gallops,no edema  Respiratory:  clear to auscultation bilaterally, normal work of breathing GI: soft, nontender, nondistended, + BS MS: no deformity or atrophy  Skin: warm and dry Neuro:  Strength and sensation are intact Psych: euthymic mood, full affect  EKG:  EKG is ordered  today. Personal review of the ekg ordered shows sinus rhythm, rate 74, QTc 450 ms  Recent Labs: 09/21/2020: Hemoglobin 9.3; Platelets 110 11/14/2020: ALT 25 02/18/2021: Magnesium 1.9 02/28/2021: BUN 13; Creatinine, Ser 1.24; Potassium 3.3; Sodium 138    Lipid Panel     Component Value Date/Time   CHOL 204 (H) 09/22/2012 1601   TRIG 80.0 09/22/2012 1601   HDL 57.30 09/22/2012 1601   CHOLHDL 4 09/22/2012 1601   VLDL 16.0 09/22/2012 1601   LDLCALC 68 12/16/2011 1438   LDLDIRECT 135.0 09/22/2012 1601     Wt Readings from Last 3 Encounters:  04/14/21 200 lb 3.2 oz (90.8 kg)  02/18/21 193 lb (87.5 kg)  11/14/20 193 lb (87.5 kg)      Other studies Reviewed: Additional studies/ records that were reviewed today include: TTE 12/25/20  Review of the above records today demonstrates:  Ejection fraction 60 to 65% Mild aortic valve sclerosis Trace mitral regurgitation Trace tricuspid regurgitation  Cardiac monitor 02/18/2021 personally reviewed  Unremarkable event monitor. Patient symptoms did not correlate with any significant findings on the monitor.   ASSESSMENT AND PLAN:  1.  Syncope: Has been evaluated by neurology.  No evidence of seizure-like activity.  She also wore a cardiac monitor that showed no arrhythmias.  Her QT interval is somewhat prolonged on a few of her ECGs.  She has had episodes of syncope.  I am going to start her on nadolol today for long QT.  She does not have a family history, though her episodes of syncope are somewhat suspicious.  She is also on Zoloft.  We Moriah Shawley have her discuss with the prescribing provider to get on a medication that does not prolong the QT interval.  2.  Hypertension: Blood pressures well controlled.  She does feel somewhat bloated.  She also gets short of breath when she lies flat.  Akanksha Bellmore order a BNP.  Case discussed with referring cardiologist  Current medicines are reviewed at length with the patient today.   The patient does not have  concerns regarding her medicines.  The following changes were made today: Start nadolol  Labs/ tests ordered today include:  Orders Placed This Encounter  Procedures  . Pro b natriuretic peptide (BNP)  . EKG 12-Lead     Disposition:   FU with Atiyah Bauer 3 months  Signed, Royalty Fakhouri Jorja Loa, MD  04/14/2021 9:39 AM     [facility] [address] [place] [address] [phone] (office) [phone] (fax)

## 2021-04-15 LAB — PRO B NATRIURETIC PEPTIDE: NT-Pro BNP: 97 pg/mL (ref 0–249)

## 2021-04-21 ENCOUNTER — Other Ambulatory Visit: Payer: Self-pay | Admitting: Cardiology

## 2021-05-10 DIAGNOSIS — R4781 Slurred speech: Secondary | ICD-10-CM

## 2021-05-10 DIAGNOSIS — N189 Chronic kidney disease, unspecified: Secondary | ICD-10-CM

## 2021-05-10 DIAGNOSIS — N179 Acute kidney failure, unspecified: Secondary | ICD-10-CM

## 2021-05-10 DIAGNOSIS — K7200 Acute and subacute hepatic failure without coma: Secondary | ICD-10-CM

## 2021-05-10 HISTORY — DX: Chronic kidney disease, unspecified: N18.9

## 2021-05-10 HISTORY — DX: Chronic kidney disease, unspecified: N17.9

## 2021-05-10 HISTORY — DX: Acute and subacute hepatic failure without coma: K72.00

## 2021-05-10 HISTORY — DX: Slurred speech: R47.81

## 2021-07-29 ENCOUNTER — Other Ambulatory Visit: Payer: Self-pay | Admitting: Cardiology

## 2021-08-11 ENCOUNTER — Other Ambulatory Visit: Payer: Self-pay

## 2021-08-11 ENCOUNTER — Ambulatory Visit (INDEPENDENT_AMBULATORY_CARE_PROVIDER_SITE_OTHER): Payer: No Typology Code available for payment source | Admitting: Cardiology

## 2021-08-11 ENCOUNTER — Encounter: Payer: Self-pay | Admitting: Cardiology

## 2021-08-11 VITALS — BP 90/60 | HR 58 | Ht 64.0 in | Wt 197.0 lb

## 2021-08-11 DIAGNOSIS — R9431 Abnormal electrocardiogram [ECG] [EKG]: Secondary | ICD-10-CM | POA: Diagnosis not present

## 2021-08-11 DIAGNOSIS — R55 Syncope and collapse: Secondary | ICD-10-CM | POA: Diagnosis not present

## 2021-08-11 MED ORDER — NADOLOL 20 MG PO TABS
10.0000 mg | ORAL_TABLET | Freq: Every day | ORAL | 2 refills | Status: DC
Start: 2021-08-11 — End: 2022-06-02

## 2021-08-11 NOTE — Progress Notes (Signed)
Electrophysiology Office Note   Date:  08/11/2021   ID:  [name], DOB 02-Jun-1971, MRN [id number]  PCP:  Street, Stephanie Coup, MD  Cardiologist:  Revankar Primary Electrophysiologist:  Lynn Mcnulty Jorja Loa, MD    Chief Complaint: syncope   History of Present Illness: [name] is a 50 y.o. female who is being seen today for the evaluation of syncope at the request of [name], *. Presenting today for electrophysiology evaluation.  History significant for hypertension, hyperlipidemia, obesity status post gastric bypass, and diabetes.  She has had multiple episodes of syncope.  She had been evaluated by neurology, though nothing had been found and she was not having seizures.  She had a bad episode of COVID.  Her first episode of syncope occurred when she was at a flea market walking around with her husband.  She felt quite poorly and had an episode of syncope.  She had no changes in position.  She woke up with paramedics around her.  She was told by the emergency room and paramedics that she had a seizure.  Her second episode occurred at 3 months thereafter.  She was walking around the house.  She hit the counter and had a cut on her forehead.  She felt poorly prior to that.  She was unclear as to whether or not she was having palpitations.  She has since been started on nadolol for possible prolonged QT.  She is continue to have episodes of syncope.  Her episode of syncope recurred when she stands up quickly.  1 occurred at a dance recital for her granddaughter, second occurred when she was at home.  She continues to have chest pain or shortness of breath that has been occurring with both her and her husband since their first episode of COVID.  Chest pain occurs when she is at rest.  There is at times an exertional component, though this is not repeatable.  Today, denies symptoms of palpitations, orthopnea, PND, lower extremity edema, claudication, dizziness,  presyncope, bleeding, or neurologic sequela. The patient is tolerating medications without difficulties.     Past Medical History:  Diagnosis Date   Abdominal pain    Abdominal pain 09/06/2012   Abnormal mammogram of left breast 07/02/2016   Formatting of this note might be different from the original. Left posterior upper-outer quadrant 1.5cm asymmetry with decreased attenuation and conspicuity. BIRAD 3. 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 Reports biopsy with NAFLD   CKD (chronic kidney disease)    Cold sore 12/16/2011   Depression 08/19/2011   Depression with anxiety    Diabetes mellitus    Diabetes mellitus (HCC) 06/01/2011   Diabetic polyneuropathy associated with type 2 diabetes mellitus (HCC) 11/14/2020   Dizziness    Edema of both lower legs due to peripheral venous insufficiency    Eyelid eczema 01/06/2012   Fatigue    loss of sleep   Gastric bypass status for obesity 02/28/2014   Gastroesophageal reflux disease without esophagitis 01/27/2015   Generalized headaches    H/O type 2 diabetes mellitus 08/03/2013   Herpes simplex 12/16/2011   Formatting of this  note might be different from the original. Last Assessment & Plan:  New.  Start Valtrex to improve sxs.   History of Roux-en-Y gastric bypass    Hoarseness 06/14/2013   Hormone replacement therapy, postmenopausal 06/17/2016   Hx of migraines 08/03/2013   Hyperkalemia 11/14/2017   Hyperlipidemia    IBS (irritable bowel syndrome)    Insomnia 01/28/2016   Kidney stones    Liver disease    Lumbar paraspinal muscle spasm 02/06/2013   Major depressive disorder, single episode 08/03/2013   Metabolic acidosis 11/14/2017   Mixed  insomnia    NASH (nonalcoholic steatohepatitis) 11/14/2020   Nephrolithiasis 01/28/2016   Formatting of this note might be different from the original. S/p lithotripsy and ureteroscopy. 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   Paresthesia 06/14/2013   Pelvic pain in female 06/24/2011   Pericardial effusion 11/29/2012   Peripheral edema 04/27/2016   Poor appetite    Recurrent kidney stones 05/09/2013   Recurrent UTI    Restless leg 11/14/2020   S/P gastric bypass 06/01/2011   Seizure disorder (HCC) 11/14/2020   Small bowel intussusception (HCC) 01/17/2014   Superficial postoperative wound infection 08/11/2013   Syncope 05/09/2013   Tension headache 05/09/2013   Thrombophlebitis    Type 2 diabetes mellitus with hyperlipidemia (HCC)    Type 2 diabetes mellitus without complications (HCC) 06/01/2011   Vitamin D deficiency    Wears glasses    Weight decrease    Past Surgical History:  Procedure Laterality Date   ABDOMINAL HYSTERECTOMY  2008   for polycystic ovaries   APPENDECTOMY     BILATERAL SALPINGOOPHORECTOMY  2012   Dr Tenny Craw   CHOLECYSTECTOMY  2005   COLONOSCOPY     negative, done for pain   CYSTOSTOMY W/ BLADDER BIOPSY     EXPLORATORY LAPAROTOMY  09/01/11   GASTRIC BYPASS  10/2010   kidney stones     LAPAROSCOPY  10/19/2011   Procedure: LAPAROSCOPY OPERATIVE;  Surgeon: Miguel Aschoff;  Location: [facility];  Service: Gynecology;  Laterality: N/A;  Operative Laparoscopy with Lysis Of Adhesions   LAPAROTOMY  10/19/2011   Procedure: LAPAROTOMY;  Surgeon: Miguel Aschoff;  Location: [facility];  Service: Gynecology;  Laterality: Bilateral;  Laparotomy With Bilateral Oophorectomy   TUBAL LIGATION     x3   UPPER GASTROINTESTINAL ENDOSCOPY     to assess varices     Current Outpatient Medications  Medication Sig Dispense  Refill   allopurinol (ZYLOPRIM) 100 MG tablet Take 100 mg by mouth daily.     ALPRAZolam (XANAX) 1 MG tablet Take 1 mg by mouth 3 (three) times daily as needed for anxiety.     atorvastatin (LIPITOR) 40 MG tablet Take 40 mg at bedtime by mouth.  3   estradiol (ESTRACE) 1 MG tablet Take 1 mg by mouth daily.     furosemide (LASIX) 20 MG tablet Take 20 mg by mouth daily.     gabapentin (NEURONTIN) 300 MG capsule Take 300 mg by mouth daily.     lamoTRIgine (LAMICTAL) 25 MG tablet Take 100 mg by mouth 2 (two) times daily.     nadolol (CORGARD) 20 MG tablet Take 0.5 tablets (10 mg total) by mouth daily. 45 tablet 2   Potassium Chloride ER 20 MEQ TBCR TAKE 1 TABLET BY MOUTH TWICE A  DAY 180 tablet 2   TRULICITY 0.75 MG/0.5ML SOPN Inject 0.75 mg every Thursday into the skin.  0   No current facility-administered medications for this visit.    Allergies:   Patient has no known allergies.   Social History:  The patient  reports that she has never smoked. She has never used smokeless tobacco. She reports that she does not drink alcohol and does not use drugs.   Family History:  The patient's family history includes Cancer in her mother; Colon cancer in her father; Crohn's disease in her father; Diabetes in her father; Heart disease in her father; Hyperlipidemia in her father; Hypertension in her brother and father; Lung cancer in her mother; Other in her brother; Stroke in her father.   ROS:  Please see the history of present illness.   Otherwise, review of systems is positive for none.   All other systems are reviewed and negative.   PHYSICAL EXAM: VS:  BP 90/60   Pulse (!) 58   Ht 5\' 4"  (1.626 m)   Wt 197 lb (89.4 kg)   SpO2 98%   BMI 33.81 kg/m  , BMI Body mass index is 33.81 kg/m. GEN: Well nourished, well developed, in no acute distress  HEENT: normal  Neck: no JVD, carotid bruits, or masses Cardiac: RRR; no murmurs, rubs, or gallops,no edema  Respiratory:  clear to auscultation  bilaterally, normal work of breathing GI: soft, nontender, nondistended, + BS MS: no deformity or atrophy  Skin: warm and dry Neuro:  Strength and sensation are intact Psych: euthymic mood, full affect  EKG:  EKG is ordered today. Personal review of the ekg ordered shows sinus rhythm, rate 58   Recent Labs: 09/21/2020: Hemoglobin 9.3; Platelets 110 11/14/2020: ALT 25 02/18/2021: Magnesium 1.9 02/28/2021: BUN 13; Creatinine, Ser 1.24; Potassium 3.3; Sodium 138 04/14/2021: NT-Pro BNP 97    Lipid Panel     Component Value Date/Time   CHOL 204 (H) 09/22/2012 1601   TRIG 80.0 09/22/2012 1601   HDL 57.30 09/22/2012 1601   CHOLHDL 4 09/22/2012 1601   VLDL 16.0 09/22/2012 1601   LDLCALC 68 12/16/2011 1438   LDLDIRECT 135.0 09/22/2012 1601     Wt Readings from Last 3 Encounters:  08/11/21 197 lb (89.4 kg)  04/14/21 200 lb 3.2 oz (90.8 kg)  02/18/21 193 lb (87.5 kg)      Other studies Reviewed: Additional studies/ records that were reviewed today include: TTE 12/25/20  Review of the above records today demonstrates:  Ejection fraction 60 to 65% Mild aortic valve sclerosis Trace mitral regurgitation Trace tricuspid regurgitation  Cardiac monitor 02/18/2021 personally reviewed  Unremarkable event monitor. Patient symptoms did not correlate with any significant findings on the monitor.   ASSESSMENT AND PLAN:  1.  Prolonged QT interval: Has had episodes of syncope.  She has been evaluated by neurology, though no cause has been found.  Has been started on nadolol.  QTC of 449 ms today.  She is continue to have episodes of syncope.  Her episodes of syncope now sound more orthostatic in nature.  Due to that, we Phuong Moffatt reduce her dose of nadolol.  I have also educated her on orthostatic syncope.  I have instructed her to increase fluid intake.  2.  Hypertension: Currently well controlled  3.  Chest pain: Chest pain appears to be somewhat atypical for coronary disease.  This is also  been occurring since her initial episode of COVID.  I have offered her stress testing,  but she would like to hold off for now.  Current medicines are reviewed at length with the patient today.   The patient does not have concerns regarding her medicines.  The following changes were made today: Decrease nadolol to 10 mg  Labs/ tests ordered today include:  Orders Placed This Encounter  Procedures   EKG 12-Lead      Disposition:   FU with Riley Papin 3 months  Signed, Debhora Titus Jorja LoaMartin Kable Haywood, MD  08/11/2021 9:07 AM     [facility] [address] [place] [address] [phone] (office) [phone] (fax)

## 2021-08-11 NOTE — Patient Instructions (Addendum)
Medication Instructions:  Your physician has recommended you make the following change in your medication: DECREASE Nadolol to 10 mg daily  *If you need a refill on your cardiac medications before your next appointment, please call your pharmacy*   Lab Work: None ordered   Testing/Procedures: None ordered   Follow-Up: At [facility], you and your health needs are our priority.  As part of our continuing mission to provide you with exceptional heart care, we have created designated Provider Care Teams.  These Care Teams include your primary Cardiologist (physician) and Advanced Practice Providers (APPs -  Physician Assistants and Nurse Practitioners) who all work together to provide you with the care you need, when you need it.  Your next appointment:   3 month(s)  The format for your next appointment:   In Person  Provider:   Loman Brooklyn, MD    Thank you for choosing [facility]!!   Dory Horn, RN [phone]

## 2021-08-20 ENCOUNTER — Ambulatory Visit: Payer: No Typology Code available for payment source | Admitting: Cardiology

## 2021-09-07 ENCOUNTER — Other Ambulatory Visit: Payer: Self-pay

## 2021-09-07 ENCOUNTER — Emergency Department (HOSPITAL_BASED_OUTPATIENT_CLINIC_OR_DEPARTMENT_OTHER)
Admission: EM | Admit: 2021-09-07 | Discharge: 2021-09-07 | Disposition: A | Discharge status: Home / Self Care | Payer: No Typology Code available for payment source | Attending: Emergency Medicine | Admitting: Emergency Medicine

## 2021-09-07 ENCOUNTER — Emergency Department (HOSPITAL_BASED_OUTPATIENT_CLINIC_OR_DEPARTMENT_OTHER): Payer: No Typology Code available for payment source

## 2021-09-07 ENCOUNTER — Encounter (HOSPITAL_BASED_OUTPATIENT_CLINIC_OR_DEPARTMENT_OTHER): Payer: Self-pay | Admitting: Emergency Medicine

## 2021-09-07 DIAGNOSIS — R310 Gross hematuria: Secondary | ICD-10-CM | POA: Insufficient documentation

## 2021-09-07 DIAGNOSIS — N183 Chronic kidney disease, stage 3 unspecified: Secondary | ICD-10-CM | POA: Insufficient documentation

## 2021-09-07 DIAGNOSIS — R319 Hematuria, unspecified: Secondary | ICD-10-CM | POA: Diagnosis present

## 2021-09-07 DIAGNOSIS — E1122 Type 2 diabetes mellitus with diabetic chronic kidney disease: Secondary | ICD-10-CM | POA: Diagnosis not present

## 2021-09-07 DIAGNOSIS — R112 Nausea with vomiting, unspecified: Secondary | ICD-10-CM | POA: Insufficient documentation

## 2021-09-07 DIAGNOSIS — R109 Unspecified abdominal pain: Secondary | ICD-10-CM | POA: Diagnosis not present

## 2021-09-07 LAB — URINALYSIS, ROUTINE W REFLEX MICROSCOPIC
Bilirubin Urine: NEGATIVE
Glucose, UA: NEGATIVE mg/dL
Ketones, ur: NEGATIVE mg/dL
Leukocytes,Ua: NEGATIVE
Nitrite: NEGATIVE
Protein, ur: NEGATIVE mg/dL
Specific Gravity, Urine: 1.01 (ref 1.005–1.030)
pH: 8 (ref 5.0–8.0)

## 2021-09-07 LAB — URINALYSIS, MICROSCOPIC (REFLEX): RBC / HPF: >50 RBC/hpf (ref 0–5)

## 2021-09-07 LAB — CBC WITH DIFFERENTIAL/PLATELET
Abs Immature Granulocytes: 0.01 10*3/uL (ref 0.00–0.07)
Basophils Absolute: 0.1 10*3/uL (ref 0.0–0.1)
Basophils Relative: 1 %
Eosinophils Absolute: 0.2 10*3/uL (ref 0.0–0.5)
Eosinophils Relative: 3 %
HCT: 39.5 % (ref 36.0–46.0)
Hemoglobin: 13.2 g/dL (ref 12.0–15.0)
Immature Granulocytes: 0 %
Lymphocytes Relative: 51 %
Lymphs Abs: 2.5 10*3/uL (ref 0.7–4.0)
MCH: 29.7 pg (ref 26.0–34.0)
MCHC: 33.4 g/dL (ref 30.0–36.0)
MCV: 88.8 fL (ref 80.0–100.0)
Monocytes Absolute: 0.2 10*3/uL (ref 0.1–1.0)
Monocytes Relative: 4 %
Neutro Abs: 2 10*3/uL (ref 1.7–7.7)
Neutrophils Relative %: 41 %
Platelets: 151 10*3/uL (ref 150–400)
RBC: 4.45 MIL/uL (ref 3.87–5.11)
RDW: 12.8 % (ref 11.5–15.5)
WBC: 4.9 10*3/uL (ref 4.0–10.5)
nRBC: 0 % (ref 0.0–0.2)

## 2021-09-07 LAB — COMPREHENSIVE METABOLIC PANEL
ALT: 30 U/L (ref 0–44)
AST: 35 U/L (ref 15–41)
Albumin: 4.2 g/dL (ref 3.5–5.0)
Alkaline Phosphatase: 78 U/L (ref 38–126)
Anion gap: 8 (ref 5–15)
BUN: 19 mg/dL (ref 6–20)
CO2: 32 mmol/L (ref 22–32)
Calcium: 8.9 mg/dL (ref 8.9–10.3)
Chloride: 100 mmol/L (ref 98–111)
Creatinine, Ser: 1.58 mg/dL — ABNORMAL HIGH (ref 0.44–1.00)
GFR, Estimated: 40 mL/min — ABNORMAL LOW (ref >60)
Glucose, Bld: 94 mg/dL (ref 70–99)
Potassium: 3.6 mmol/L (ref 3.5–5.1)
Sodium: 140 mmol/L (ref 135–145)
Total Bilirubin: 0.5 mg/dL (ref 0.3–1.2)
Total Protein: 6.9 g/dL (ref 6.5–8.1)

## 2021-09-07 LAB — LIPASE, BLOOD: Lipase: 98 U/L — ABNORMAL HIGH (ref 11–51)

## 2021-09-07 MED ORDER — LACTATED RINGERS IV BOLUS
1000.0000 mL | Freq: Once | INTRAVENOUS | Status: AC
  Administered 2021-09-07: 1000 mL via INTRAVENOUS

## 2021-09-07 MED ORDER — METOCLOPRAMIDE HCL 5 MG/ML IJ SOLN
10.0000 mg | Freq: Once | INTRAMUSCULAR | Status: AC
  Administered 2021-09-07: 10 mg via INTRAVENOUS
  Filled 2021-09-07: qty 2

## 2021-09-07 MED ORDER — MORPHINE SULFATE (PF) 4 MG/ML IV SOLN
4.0000 mg | Freq: Once | INTRAVENOUS | Status: AC
  Administered 2021-09-07: 4 mg via INTRAVENOUS
  Filled 2021-09-07: qty 1

## 2021-09-07 NOTE — ED Provider Notes (Signed)
[facility] EMERGENCY DEPARTMENT Provider Note   CSN: [id number] Arrival date & time: 09/07/21  1406     History Chief Complaint  Patient presents with   Hematuria   Flank Pain    [name] is a 50 y.o. female.  HPI     50yo female with history of cirrhosis, diabetes, CKD, depression, prior nephrolithiasis presents with concern for left flank pain, hematuria, nausea and vomiting.  Symptoms began yesterday, feels imilar to prior stones. Sees urology. Severe left flank pain, some radiation to front. Associated n/v, approx 5 times. No hematemesis. No black or bloody stools, no diarrhea or constipation. Has some dysuria and hematuria.  Baseline low BP.  No fevers.  Pain is severe, constant, waxing and waning.   Past Medical History:  Diagnosis Date   Abdominal pain 09/06/2012   Abnormal mammogram of left breast 07/02/2016   Formatting of this note might be different from the original. Left posterior upper-outer quadrant 1.5cm asymmetry with decreased attenuation and conspicuity. BIRAD 3. 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 Reports biopsy with NAFLD   CKD (chronic kidney disease)    Cold sore 12/16/2011   Depression 08/19/2011   Depression with anxiety    Diabetes mellitus (HCC) 06/01/2011   Diabetic polyneuropathy associated with type 2 diabetes mellitus (HCC) 11/14/2020   Dizziness    Edema of both lower legs due to peripheral venous insufficiency    Eyelid  eczema 01/06/2012   Fatigue    loss of sleep   Gastric bypass status for obesity 02/28/2014   Gastroesophageal reflux disease without esophagitis 01/27/2015   Generalized headaches    H/O type 2 diabetes mellitus 08/03/2013   Herpes simplex 12/16/2011   Formatting of this note might be different from the original. Last Assessment & Plan:  New.  Start Valtrex to improve sxs.   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 S/p lithotripsy and ureteroscopy. 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   Recurrent  kidney stones 05/09/2013   Recurrent UTI    Restless leg 11/14/2020   S/P gastric bypass 06/01/2011   Seizure disorder (HCC) 11/14/2020   Slurred speech 05/10/2021   Small bowel intussusception (HCC) 01/17/2014   Stage 3 chronic kidney disease (HCC) 03/17/2021   Superficial postoperative wound infection 08/11/2013   Syncope 05/09/2013   Tension headache 05/09/2013    Thrombophlebitis    Type 2 diabetes mellitus with hyperlipidemia (HCC)    Type 2 diabetes mellitus without complications (HCC) 06/01/2011   Vitamin D deficiency    Wears glasses    Weight decrease     Patient Active Problem List   Diagnosis Date Noted   Acute liver failure 05/10/2021   Acute-on-chronic kidney injury (HCC) 05/10/2021   Slurred speech 05/10/2021   Alcohol use disorder, severe, dependence (HCC) 03/18/2021   Liver cirrhosis, alcoholic (HCC) 03/17/2021   Stage 3 chronic kidney disease (HCC) 03/17/2021   MDD (major depressive disorder), recurrent severe, 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   Chronic iron deficiency anemia 02/28/2021   Obesity (BMI 30.0-34.9) 02/18/2021   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   Blood in urine    Blurred vision    Chills    CKD (chronic kidney disease)    Depression with anxiety    Dizziness    Edema of both lower legs due to peripheral venous insufficiency    Fatigue    Generalized headaches    IBS (irritable bowel syndrome)    Liver disease    Ovarian cyst    Poor appetite    Recurrent UTI    Thrombophlebitis    Type 2 diabetes mellitus with hyperlipidemia (HCC)    Wears glasses    Weight decrease    Seizure disorder (HCC) 11/14/2020   Restless leg 11/14/2020   Diabetic polyneuropathy associated with type 2 diabetes mellitus (HCC) 11/14/2020   NASH (nonalcoholic steatohepatitis) 11/14/2020   AKI (acute kidney injury) (HCC) 11/14/2017   Metabolic acidosis 11/14/2017   Hyperkalemia 11/14/2017   Abnormal mammogram of left breast 07/02/2016   Hormone replacement therapy, postmenopausal 06/17/2016   Peripheral edema 04/27/2016   Nephrolithiasis 01/28/2016   Insomnia 01/28/2016   Vitamin D deficiency 03/01/2015   Gastroesophageal reflux disease without esophagitis 01/27/2015   Anxiety 01/25/2015   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   Gastric bypass status for obesity 02/28/2014   Obesity (BMI 35.0-39.9 without  comorbidity) 01/17/2014   Obesity 01/17/2014   Small bowel intussusception (HCC) 01/17/2014   Superficial postoperative wound infection 08/11/2013   H/O type 2 diabetes mellitus 08/03/2013   Hx of migraines 08/03/2013   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   Adnexal cyst 07/13/2013   Appendicitis 06/21/2013   Hoarseness 06/14/2013   Paresthesia 06/14/2013   Syncope 05/09/2013   Recurrent kidney stones 05/09/2013   Tension headache 05/09/2013   Lumbar paraspinal muscle spasm 02/06/2013   Pericardial effusion 11/29/2012   Abdominal pain 09/06/2012   Cirrhosis, nonalcoholic (HCC) 09/06/2012   Eyelid eczema 01/06/2012   Hyperlipidemia 12/16/2011   Cold sore 12/16/2011   Herpes simplex 12/16/2011   Depression 08/19/2011   Pelvic pain in female 06/24/2011   Diabetes mellitus (HCC) 06/01/2011   S/P gastric bypass 06/01/2011   Non-alcoholic fatty liver disease 06/01/2011   Type 2 diabetes mellitus without complications (HCC) 06/01/2011    Past Surgical History:  Procedure Laterality Date   ABDOMINAL HYSTERECTOMY  2008   for polycystic ovaries   APPENDECTOMY     BILATERAL SALPINGOOPHORECTOMY  2012   Dr Tenny Craw  CHOLECYSTECTOMY  2005   COLONOSCOPY     negative, done for pain   CYSTOSTOMY W/ BLADDER BIOPSY     EXPLORATORY LAPAROTOMY  09/01/11   GASTRIC BYPASS  10/2010   kidney stones     LAPAROSCOPY  10/19/2011   Procedure: LAPAROSCOPY OPERATIVE;  Surgeon: Miguel Aschoff;  Location: [facility];  Service: Gynecology;  Laterality: N/A;  Operative Laparoscopy with Lysis Of Adhesions   LAPAROTOMY  10/19/2011   Procedure: LAPAROTOMY;  Surgeon: Miguel Aschoff;  Location: [facility];  Service: Gynecology;  Laterality: Bilateral;  Laparotomy With Bilateral Oophorectomy   TUBAL LIGATION     x3   UPPER GASTROINTESTINAL ENDOSCOPY     to assess varices     OB History   No obstetric history on file.     Family History  Problem Relation Age of Onset   Lung cancer Mother    Cancer Mother        lung    Hyperlipidemia Father    Heart disease Father    Stroke Father    Hypertension Father    Diabetes Father    Crohn's disease Father    Colon cancer Father    Hypertension Brother    Other Brother        suicide    Social History   Tobacco Use   Smoking status: Never   Smokeless tobacco: Never  Vaping Use   Vaping Use: Never used  Substance Use Topics   Alcohol use: No    Comment: never   Drug use: No    Home Medications Prior to Admission medications   Medication Sig Start Date End Date Taking? Authorizing Provider  allopurinol (ZYLOPRIM) 100 MG tablet Take 100 mg by mouth daily.    [provider]  ALPRAZolam Prudy Feeler) 1 MG tablet Take 1 mg by mouth 3 (three) times daily as needed for anxiety. 02/10/21   [provider]  atorvastatin (LIPITOR) 40 MG tablet Take 40 mg at bedtime by mouth. 08/23/17   [provider]  estradiol (ESTRACE) 1 MG tablet Take 1 mg by mouth daily. 11/15/20   [provider]  furosemide (LASIX) 20 MG tablet Take 20 mg by mouth daily.    [provider]  gabapentin (NEURONTIN) 300 MG capsule Take 300 mg by mouth daily. 01/14/21   [provider]  lamoTRIgine (LAMICTAL) 25 MG tablet Take 100 mg by mouth 2 (two) times daily. 06/18/21   [provider]  nadolol (CORGARD) 20 MG tablet Take 0.5 tablets (10 mg total) by mouth daily. 08/11/21   Camnitz, Andree Coss, MD  Potassium Chloride ER 20 MEQ TBCR TAKE 1 TABLET BY MOUTH TWICE A DAY 04/21/21   Revankar, Aundra Dubin, MD  TRULICITY 0.75 MG/0.5ML SOPN Inject 0.75 mg every Thursday into the skin. 09/01/17   [provider]    Allergies    Bupropion, Nortriptyline, Zofran [ondansetron hcl], and Zoloft [sertraline hcl]  Review of Systems   Review of Systems  Constitutional:  Negative for fever.  HENT:  Negative for sore throat.   Eyes:  Negative for visual disturbance.  Respiratory:  Negative for cough and shortness of breath.    Cardiovascular:  Negative for chest pain.  Gastrointestinal:  Positive for abdominal pain, nausea and vomiting. Negative for constipation and diarrhea.  Genitourinary:  Positive for dysuria, flank pain and hematuria. Negative for difficulty urinating.  Musculoskeletal:  Negative for back pain and neck pain.  Skin:  Negative for rash.  Neurological:  Negative  for syncope and headaches.   Physical Exam Updated Vital Signs BP 100/76   Pulse 62   Temp 98 F (36.7 C) (Oral)   Resp 16   Ht  (1.626 m)   Wt 87.1 kg   SpO2 98%   BMI 32.96 kg/m   Physical Exam Vitals and nursing note reviewed.  Constitutional:      General: She is not in acute distress.    Appearance: Normal appearance. She is ill-appearing (actively vomiting and in pain). She is not toxic-appearing or diaphoretic.  HENT:     Head: Normocephalic.  Eyes:     Conjunctiva/sclera: Conjunctivae normal.  Cardiovascular:     Rate and Rhythm: Normal rate and regular rhythm.     Pulses: Normal pulses.  Pulmonary:     Effort: Pulmonary effort is normal. No respiratory distress.  Abdominal:     General: Abdomen is flat.     Tenderness: There is no abdominal tenderness. There is left CVA tenderness.  Musculoskeletal:        General: No deformity or signs of injury.     Cervical back: No rigidity.  Skin:    General: Skin is warm and dry.     Coloration: Skin is not jaundiced or pale.  Neurological:     General: No focal deficit present.     Mental Status: She is alert and oriented to person, place, and time.    ED Results / Procedures / Treatments   Labs (all labs ordered are listed, but only abnormal results are displayed) Labs Reviewed  URINALYSIS, ROUTINE W REFLEX MICROSCOPIC - Abnormal; Notable for the following components:      Result Value   Color, Urine PINK (*)    APPearance CLOUDY (*)    Hgb urine dipstick LARGE (*)    All other components within normal limits  COMPREHENSIVE METABOLIC PANEL -  Abnormal; Notable for the following components:   Creatinine, Ser 1.58 (*)    GFR, Estimated 40 (*)    All other components within normal limits  LIPASE, BLOOD - Abnormal; Notable for the following components:   Lipase 98 (*)    All other components within normal limits  URINALYSIS, MICROSCOPIC (REFLEX) - Abnormal; Notable for the following components:   Bacteria, UA FEW (*)    All other components within normal limits  URINE CULTURE  CBC WITH DIFFERENTIAL/PLATELET    EKG None  Radiology CT Renal Stone Study  Result Date: 09/07/2021 CLINICAL DATA:  Left flank pain with nausea and vomiting EXAM: CT ABDOMEN AND PELVIS WITHOUT CONTRAST TECHNIQUE: Multidetector CT imaging of the abdomen and pelvis was performed following the standard protocol without IV contrast. COMPARISON:  CT 12/25/2015, 10/07/2020 FINDINGS: Lower chest: Lung bases demonstrate no acute consolidation or effusion. Trace pericardial effusion. Hepatobiliary: Lobulated liver contour suspect for cirrhosis. Status post cholecystectomy. No biliary dilatation Pancreas: Unremarkable. No pancreatic ductal dilatation or surrounding inflammatory changes. Spleen: Upper normal in size at 13.5 cm. Adrenals/Urinary Tract: Adrenal glands are normal. Kidneys show no hydronephrosis. No ureteral stone. Bladder normal Stomach/Bowel: Stomach is within normal limits. Nonvisualized appendix. No evidence of bowel wall thickening, distention, or inflammatory changes. Vascular/Lymphatic: Mild aortic atherosclerosis. No aneurysm. No suspicious nodes Reproductive: Status post hysterectomy. No adnexal masses. Other: Negative for free air or free fluid. Small fat containing umbilical hernia Musculoskeletal: No acute or significant osseous findings. IMPRESSION: 1. No CT evidence for acute intra-abdominal or pelvic abnormality. 2. Lobulated liver contour suspicious for cirrhosis Electronically Signed   By:  Jasmine Pang M.D.   On: 09/07/2021 16:23     Procedures Procedures   Medications Ordered in ED Medications  lactated ringers bolus 1,000 mL (0 mLs Intravenous Stopped 09/07/21 1745)  morphine 4 MG/ML injection 4 mg (4 mg Intravenous Given 09/07/21 1605)  metoCLOPramide (REGLAN) injection 10 mg (10 mg Intravenous Given 09/07/21 1607)    ED Course  I have reviewed the triage vital signs and the nursing notes.  Pertinent labs & imaging results that were available during my care of the patient were reviewed by me and considered in my medical decision making (see chart for details).    MDM Rules/Calculators/A&P                            50yo female with history of cirrhosis, diabetes, CKD, depression, prior nephrolithiasis presents with concern for left flank pain, hematuria, nausea and vomiting.  DDx includes pyelonephritis, nephrolithiasis, diverticulitis.  Lipase elevated however not consistent with pancreatitis. Normal pulses, do not feel history and exam are consistent with dissection or acute vascular problem. No dyspnea, doubt PE.   CT stone study shows no acute abnormalities. UA with hematuria however no signs of infection. Has had similar presentation at past ED visits, possible passed kidney stone. Recommend urology follow up for hematuria, PCP follow up. BP at baseline, decreased with pain medication and increased to baseline. Appropriate for outpatient follow up. Patient discharged in stable condition with understanding of reasons to return.    Final Clinical Impression(s) / ED Diagnoses Final diagnoses:  Left flank pain  Non-intractable vomiting with nausea, unspecified vomiting type  Gross hematuria    Rx / DC Orders ED Discharge Orders     None        Alvira Monday, MD 09/08/21 1219

## 2021-09-07 NOTE — ED Triage Notes (Signed)
Pt reports history of kidney stones, presents with left flank pain, N/V and blood in urine onset yesterday.

## 2021-09-09 LAB — URINE CULTURE: Culture: NO GROWTH

## 2021-11-10 ENCOUNTER — Ambulatory Visit: Payer: No Typology Code available for payment source | Admitting: Cardiology

## 2021-12-20 ENCOUNTER — Emergency Department (HOSPITAL_BASED_OUTPATIENT_CLINIC_OR_DEPARTMENT_OTHER)
Admission: EM | Admit: 2021-12-20 | Discharge: 2021-12-21 | Disposition: A | Discharge status: Home / Self Care | Payer: No Typology Code available for payment source | Attending: Emergency Medicine | Admitting: Emergency Medicine

## 2021-12-20 ENCOUNTER — Emergency Department (HOSPITAL_BASED_OUTPATIENT_CLINIC_OR_DEPARTMENT_OTHER): Payer: No Typology Code available for payment source

## 2021-12-20 ENCOUNTER — Other Ambulatory Visit: Payer: Self-pay

## 2021-12-20 ENCOUNTER — Encounter (HOSPITAL_BASED_OUTPATIENT_CLINIC_OR_DEPARTMENT_OTHER): Payer: Self-pay | Admitting: *Deleted

## 2021-12-20 DIAGNOSIS — Z79899 Other long term (current) drug therapy: Secondary | ICD-10-CM | POA: Diagnosis not present

## 2021-12-20 DIAGNOSIS — R197 Diarrhea, unspecified: Secondary | ICD-10-CM | POA: Insufficient documentation

## 2021-12-20 DIAGNOSIS — R42 Dizziness and giddiness: Secondary | ICD-10-CM | POA: Diagnosis not present

## 2021-12-20 DIAGNOSIS — E1122 Type 2 diabetes mellitus with diabetic chronic kidney disease: Secondary | ICD-10-CM | POA: Insufficient documentation

## 2021-12-20 DIAGNOSIS — I129 Hypertensive chronic kidney disease with stage 1 through stage 4 chronic kidney disease, or unspecified chronic kidney disease: Secondary | ICD-10-CM | POA: Diagnosis not present

## 2021-12-20 DIAGNOSIS — E1142 Type 2 diabetes mellitus with diabetic polyneuropathy: Secondary | ICD-10-CM | POA: Diagnosis not present

## 2021-12-20 DIAGNOSIS — Z8616 Personal history of COVID-19: Secondary | ICD-10-CM | POA: Diagnosis not present

## 2021-12-20 DIAGNOSIS — R55 Syncope and collapse: Secondary | ICD-10-CM | POA: Diagnosis not present

## 2021-12-20 DIAGNOSIS — N183 Chronic kidney disease, stage 3 unspecified: Secondary | ICD-10-CM | POA: Diagnosis not present

## 2021-12-20 DIAGNOSIS — R4020 Unspecified coma: Secondary | ICD-10-CM

## 2021-12-20 DIAGNOSIS — W07XXXA Fall from chair, initial encounter: Secondary | ICD-10-CM | POA: Diagnosis not present

## 2021-12-20 DIAGNOSIS — Z20822 Contact with and (suspected) exposure to covid-19: Secondary | ICD-10-CM | POA: Diagnosis not present

## 2021-12-20 DIAGNOSIS — S0181XA Laceration without foreign body of other part of head, initial encounter: Secondary | ICD-10-CM | POA: Insufficient documentation

## 2021-12-20 LAB — BASIC METABOLIC PANEL
Anion gap: 9 (ref 5–15)
BUN: 18 mg/dL (ref 6–20)
CO2: 24 mmol/L (ref 22–32)
Calcium: 9 mg/dL (ref 8.9–10.3)
Chloride: 105 mmol/L (ref 98–111)
Creatinine, Ser: 1.08 mg/dL — ABNORMAL HIGH (ref 0.44–1.00)
GFR, Estimated: >60 mL/min (ref >60)
Glucose, Bld: 100 mg/dL — ABNORMAL HIGH (ref 70–99)
Potassium: 3.9 mmol/L (ref 3.5–5.1)
Sodium: 138 mmol/L (ref 135–145)

## 2021-12-20 LAB — URINALYSIS, ROUTINE W REFLEX MICROSCOPIC
Bilirubin Urine: NEGATIVE
Glucose, UA: NEGATIVE mg/dL
Ketones, ur: NEGATIVE mg/dL
Leukocytes,Ua: NEGATIVE
Nitrite: NEGATIVE
Protein, ur: NEGATIVE mg/dL
Specific Gravity, Urine: 1.025 (ref 1.005–1.030)
pH: 6 (ref 5.0–8.0)

## 2021-12-20 LAB — CBC WITH DIFFERENTIAL/PLATELET
Abs Immature Granulocytes: 0.02 10*3/uL (ref 0.00–0.07)
Basophils Absolute: 0 10*3/uL (ref 0.0–0.1)
Basophils Relative: 1 %
Eosinophils Absolute: 0.1 10*3/uL (ref 0.0–0.5)
Eosinophils Relative: 3 %
HCT: 40.5 % (ref 36.0–46.0)
Hemoglobin: 13.6 g/dL (ref 12.0–15.0)
Immature Granulocytes: 0 %
Lymphocytes Relative: 44 %
Lymphs Abs: 2.3 10*3/uL (ref 0.7–4.0)
MCH: 29.1 pg (ref 26.0–34.0)
MCHC: 33.6 g/dL (ref 30.0–36.0)
MCV: 86.5 fL (ref 80.0–100.0)
Monocytes Absolute: 0.4 10*3/uL (ref 0.1–1.0)
Monocytes Relative: 8 %
Neutro Abs: 2.3 10*3/uL (ref 1.7–7.7)
Neutrophils Relative %: 44 %
Platelets: 146 10*3/uL — ABNORMAL LOW (ref 150–400)
RBC: 4.68 MIL/uL (ref 3.87–5.11)
RDW: 13.7 % (ref 11.5–15.5)
WBC: 5.1 10*3/uL (ref 4.0–10.5)
nRBC: 0 % (ref 0.0–0.2)

## 2021-12-20 LAB — URINALYSIS, MICROSCOPIC (REFLEX): RBC / HPF: >50 RBC/hpf (ref 0–5)

## 2021-12-20 LAB — RESP PANEL BY RT-PCR (FLU A&B, COVID) ARPGX2
Influenza A by PCR: NEGATIVE
Influenza B by PCR: NEGATIVE
SARS Coronavirus 2 by RT PCR: NEGATIVE

## 2021-12-20 LAB — CBG MONITORING, ED: Glucose-Capillary: 95 mg/dL (ref 70–99)

## 2021-12-20 MED ORDER — SODIUM CHLORIDE 0.9 % IV BOLUS
1000.0000 mL | Freq: Once | INTRAVENOUS | Status: AC
  Administered 2021-12-20: 23:00:00 1000 mL via INTRAVENOUS

## 2021-12-20 NOTE — Discharge Instructions (Addendum)
Your COVID and flu testing are negative today.  It is important that you follow-up with both your cardiologist and the neurologist attached to these discharge papers.  They may be able to do a further work-up to figure out the cause of your loss of consciousness.  Also, be sure to keep your primary care provider in the loop of your illnesses.

## 2021-12-20 NOTE — ED Notes (Signed)
ED Provider at bedside. 

## 2021-12-20 NOTE — ED Triage Notes (Addendum)
Pt and family reports pt had ?seizure on Tuesday and fell, hitting head on kitchen counter. Pt had large goose egg on back of head. States not feeling well this week. C/o nausea and diarrhea, headache, increased drowsiness, and pain on sides of her neck. States she waited until today to come in bc she didn't want to miss event at her child's school

## 2021-12-20 NOTE — ED Notes (Signed)
Patient transported to CT 

## 2021-12-20 NOTE — ED Provider Notes (Signed)
[facility] EMERGENCY DEPARTMENT Provider Note   CSN: [id number] Arrival date & time: 12/20/21  2148     History Chief Complaint  Patient presents with   Loss of Consciousness    [name] is a 50 y.o. female with a past medical history of chronic kidney disease, liver cirrhosis, diabetes and potential seizure disorder presenting today after a loss of consciousness that occurred on Tuesday.  Her husband reports that he was in his living room when he heard his wife fall in the kitchen.  She had fallen backwards out of a highchair and hit her head on the ground.  She was unresponsive for about 10 seconds and husband reports she just stared at him.  When she came to she was not confused and reported that she felt dizzy prior to her fall but must of lost consciousness before she could alert him.  They report that this is happened multiple times however the other times he thought she was having a seizure due to her shaking and foam coming out of her mouth.  Both of these episodes occurred in 2020, shortly after being diagnosed with COVID-19.  Similarly, the few days prior to this fall on Tuesday, patient reported feeling sick.  She had multiple episodes of diarrhea and felt overall sick.   At 1 point she saw her neurologist who told her she had a negative EEG.  She saw cardiology due to these continued syncopal episodes this August and they told her that she had a prolonged QT interval.    She did not come to get evaluated after her episode on Tuesday because she had a commitment with her daughter the following day.  Past Medical History:  Diagnosis Date   Abdominal pain 09/06/2012   Abnormal mammogram of left breast 07/02/2016   Formatting of this note might be different from the original. Left posterior upper-outer quadrant 1.5cm asymmetry with decreased attenuation and conspicuity. BIRAD 3. Recommendation repeat diagnostic mammogram in 1 year   Acute liver failure 05/10/2021    Acute-on-chronic kidney injury (HCC) 05/10/2021   Adnexal cyst 07/13/2013   AKI (acute kidney injury) (HCC) 11/14/2017   Alcohol use disorder, severe, dependence (HCC) 03/18/2021   Anxiety 01/25/2015   Appendicitis 06/21/2013   Blood in urine    Blurred vision    when feeling very fatigued    Chills    Chronic iron deficiency anemia 02/28/2021   Cirrhosis, nonalcoholic (HCC) 09/06/2012   Formatting of this note might be different from the original. Reports biopsy with NAFLD   CKD (chronic kidney disease)    Cold sore 12/16/2011   Depression 08/19/2011   Depression with anxiety    Diabetes mellitus (HCC) 06/01/2011   Diabetic polyneuropathy associated with type 2 diabetes mellitus (HCC) 11/14/2020   Dizziness    Edema of both lower legs due to peripheral venous insufficiency    Eyelid eczema 01/06/2012   Fatigue    loss of sleep   Gastric bypass status for obesity 02/28/2014   Gastroesophageal reflux disease without esophagitis 01/27/2015   Generalized headaches    H/O type 2 diabetes mellitus 08/03/2013   Herpes simplex 12/16/2011   Formatting of this note might be different from the original. Last Assessment & Plan:  New.  Start Valtrex to improve sxs.   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 S/p lithotripsy and ureteroscopy. 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   Paresthesia 06/14/2013   Pelvic pain in female 06/24/2011   Pericardial effusion 11/29/2012   Peripheral edema 04/27/2016   Poor appetite    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   Recurrent kidney stones 05/09/2013   Recurrent UTI    Restless leg 11/14/2020   S/P gastric bypass 06/01/2011   Seizure disorder (HCC) 11/14/2020   Slurred speech 05/10/2021   Small bowel intussusception (HCC) 01/17/2014   Stage 3 chronic kidney disease (HCC) 03/17/2021   Superficial postoperative wound infection 08/11/2013   Syncope 05/09/2013   Tension headache 05/09/2013   Thrombophlebitis    Type 2 diabetes mellitus with hyperlipidemia (HCC)    Type 2 diabetes mellitus without complications (HCC) 06/01/2011   Vitamin D deficiency    Wears glasses    Weight decrease     Patient Active Problem List   Diagnosis Date Noted   Acute liver failure 05/10/2021   Acute-on-chronic kidney injury (HCC) 05/10/2021   Slurred speech 05/10/2021   Alcohol use disorder, severe, dependence (HCC) 03/18/2021   Liver cirrhosis, alcoholic (HCC) 03/17/2021   Stage 3 chronic kidney disease (HCC) 03/17/2021   MDD (major depressive disorder), recurrent severe, 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   Chronic iron deficiency anemia 02/28/2021   Obesity (BMI 30.0-34.9) 02/18/2021   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   Blood in urine    Blurred vision    Chills    CKD (chronic kidney disease)    Depression with anxiety    Dizziness    Edema of both lower legs due to peripheral venous insufficiency    Fatigue    Generalized headaches    IBS (irritable bowel syndrome)    Liver disease    Ovarian cyst    Poor appetite    Recurrent UTI    Thrombophlebitis    Type 2 diabetes mellitus with  hyperlipidemia (HCC)    Wears glasses    Weight decrease    Seizure disorder (HCC) 11/14/2020   Restless leg 11/14/2020   Diabetic polyneuropathy associated with type 2 diabetes mellitus (HCC) 11/14/2020   NASH (nonalcoholic steatohepatitis) 11/14/2020   AKI (acute kidney injury) (HCC) 11/14/2017   Metabolic acidosis 11/14/2017   Hyperkalemia 11/14/2017   Abnormal mammogram of left breast 07/02/2016   Hormone replacement therapy, postmenopausal 06/17/2016   Peripheral edema 04/27/2016   Nephrolithiasis 01/28/2016   Insomnia 01/28/2016   Vitamin D deficiency 03/01/2015   Gastroesophageal reflux disease without esophagitis 01/27/2015   Anxiety 01/25/2015   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   Gastric bypass status for obesity 02/28/2014   Obesity (BMI 35.0-39.9 without comorbidity) 01/17/2014   Obesity 01/17/2014   Small bowel intussusception (HCC) 01/17/2014   Superficial postoperative wound infection 08/11/2013   H/O type 2 diabetes mellitus 08/03/2013   Hx of migraines  08/03/2013   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   Adnexal cyst 07/13/2013   Appendicitis 06/21/2013   Hoarseness 06/14/2013   Paresthesia 06/14/2013   Syncope 05/09/2013   Recurrent kidney stones 05/09/2013   Tension headache 05/09/2013   Lumbar paraspinal muscle spasm 02/06/2013   Pericardial effusion 11/29/2012   Abdominal pain 09/06/2012   Cirrhosis, nonalcoholic ([place]) [id number]   Eyelid eczema 01/06/2012   Hyperlipidemia 12/16/2011   Cold sore 12/16/2011   Herpes simplex 12/16/2011   Depression 08/19/2011   Pelvic pain in female 06/24/2011   Diabetes mellitus ([place]) 06/01/2011   S/P gastric bypass [id number]   Non-alcoholic fatty liver disease 06/01/2011   Type 2 diabetes mellitus without complications ([place]) [id number]    Past Surgical History:  Procedure Laterality Date   ABDOMINAL HYSTERECTOMY  2008   for polycystic ovaries   APPENDECTOMY     BILATERAL SALPINGOOPHORECTOMY  2012    Dr Harrington Challenger   CHOLECYSTECTOMY  2005   COLONOSCOPY     negative, done for pain   CYSTOSTOMY W/ BLADDER BIOPSY     EXPLORATORY LAPAROTOMY  09/01/11   GASTRIC BYPASS  10/2010   kidney stones     LAPAROSCOPY  10/19/2011   Procedure: LAPAROSCOPY OPERATIVE;  Surgeon: Gus Height;  Location: [facility];  Service: Gynecology;  Laterality: N/A;  Operative Laparoscopy with Lysis Of Adhesions   LAPAROTOMY  10/19/2011   Procedure: LAPAROTOMY;  Surgeon: Gus Height;  Location: [facility];  Service: Gynecology;  Laterality: Bilateral;  Laparotomy With Bilateral Oophorectomy   TUBAL LIGATION     x3   UPPER GASTROINTESTINAL ENDOSCOPY     to assess varices     OB History   No obstetric history on file.     Family History  Problem Relation Age of Onset   Lung cancer Mother    Cancer Mother        lung   Hyperlipidemia Father    Heart disease Father    Stroke Father    Hypertension Father    Diabetes Father    Crohn's disease Father    Colon cancer Father    Hypertension Brother    Other Brother        suicide    Social History   Tobacco Use   Smoking status: Never   Smokeless tobacco: Never  Vaping Use   Vaping Use: Never used  Substance Use Topics   Alcohol use: No    Comment: never   Drug use: No    Home Medications Prior to Admission medications   Medication Sig Start Date End Date Taking? Authorizing Provider  allopurinol (ZYLOPRIM) 100 MG tablet Take 100 mg by mouth daily.    [provider]  ALPRAZolam Duanne Moron) 1 MG tablet Take 1 mg by mouth 3 (three) times daily as needed for anxiety. 02/10/21   [provider]  atorvastatin (LIPITOR) 40 MG tablet Take 40 mg at bedtime by mouth. 08/23/17   [provider]  estradiol (ESTRACE) 1 MG tablet Take 1 mg by mouth daily. 11/15/20   [provider]  furosemide (LASIX) 20 MG tablet Take 20 mg by mouth daily.    [provider]  gabapentin (NEURONTIN) 300 MG capsule Take 300 mg by mouth daily.  01/14/21   [provider]  lamoTRIgine (LAMICTAL) 25 MG tablet Take 100 mg by mouth 2 (two) times daily. 06/18/21   [provider]  nadolol (CORGARD) 20 MG tablet Take 0.5 tablets (10 mg total) by mouth daily. 08/11/21  Constance Haw, MD  Potassium Chloride ER 20 MEQ TBCR TAKE 1 TABLET BY MOUTH TWICE A DAY 04/21/21   Revankar, Reita Cliche, MD  TRULICITY [id number] SOPN Inject 0.75 mg every Thursday into the skin. 09/01/17   [provider]    Allergies    Bupropion, Nortriptyline, Zofran [ondansetron hcl], and Zoloft [sertraline hcl]  Review of Systems   Review of Systems  Constitutional:  Positive for fatigue.  Eyes:  Positive for visual disturbance.  Cardiovascular:  Negative for chest pain.  Gastrointestinal:  Positive for diarrhea and nausea.  Musculoskeletal:  Positive for neck pain.  Neurological:  Positive for syncope, weakness and headaches.  All other systems reviewed and are negative.  Physical Exam Updated Vital Signs BP (!) 123/94 (BP Location: Left Arm)    Pulse 83    Temp 98.2 F (36.8 C) (Oral)    Resp 16    Ht 5\' 4"  (1.626 m)    SpO2 98%    BMI 32.96 kg/m   Physical Exam Vitals and nursing note reviewed.  Constitutional:      General: She is not in acute distress.    Appearance: Normal appearance. She is not ill-appearing.  HENT:     Head: Normocephalic.     Comments: Small 1.5 centimeter superficial laceration to the anterior skull.  No signs of infection or bleeding.  Knot located to the posterior left skull, no bleeding or laceration.    Mouth/Throat:     Mouth: Mucous membranes are dry.  Eyes:     General: No scleral icterus.    Conjunctiva/sclera: Conjunctivae normal.  Cardiovascular:     Rate and Rhythm: Normal rate and regular rhythm.  Pulmonary:     Effort: Pulmonary effort is normal. No respiratory distress.     Breath sounds: No wheezing or rales.  Skin:    General: Skin is warm and dry.     Findings: No rash.   Neurological:     Mental Status: She is alert.  Psychiatric:        Mood and Affect: Mood normal.    ED Results / Procedures / Treatments   Labs (all labs ordered are listed, but only abnormal results are displayed) Labs Reviewed  BASIC METABOLIC PANEL - Abnormal; Notable for the following components:      Result Value   Glucose, Bld 100 (*)    Creatinine, Ser 1.08 (*)    All other components within normal limits  CBC WITH DIFFERENTIAL/PLATELET - Abnormal; Notable for the following components:   Platelets 146 (*)    All other components within normal limits  URINALYSIS, ROUTINE W REFLEX MICROSCOPIC - Abnormal; Notable for the following components:   Hgb urine dipstick LARGE (*)    All other components within normal limits  URINALYSIS, MICROSCOPIC (REFLEX) - Abnormal; Notable for the following components:   Bacteria, UA FEW (*)    All other components within normal limits  RESP PANEL BY RT-PCR (FLU A&B, COVID) [id number]  HEPATIC FUNCTION PANEL  CBG MONITORING, ED    EKG EKG Interpretation  Date/Time:  Saturday December 20 2021 22:07:59 EST Ventricular Rate:  79 PR Interval:  148 QRS Duration: 76 QT Interval:  408 QTC Calculation: 467 R Axis:   28 Text Interpretation: Normal sinus rhythm Low voltage QRS Cannot rule out Anterior infarct , age undetermined Abnormal ECG No significant change since last tracing Confirmed by Gareth Morgan [phone]) on 12/20/2021 10:31:30 PM  Radiology CT Head Wo Contrast  Result Date:  12/20/2021 CLINICAL DATA:  Trauma. EXAM: CT HEAD WITHOUT CONTRAST CT CERVICAL SPINE WITHOUT CONTRAST TECHNIQUE: Multidetector CT imaging of the head and cervical spine was performed following the standard protocol without intravenous contrast. Multiplanar CT image reconstructions of the cervical spine were also generated. COMPARISON:  Head CT dated 09/21/2020. FINDINGS: CT HEAD FINDINGS Brain: The ventricles and sulci are appropriate size for the patient's age. The  gray-white matter discrimination is preserved. There is no acute intracranial hemorrhage. No mass effect or midline shift. No extra-axial fluid collection. Vascular: No hyperdense vessel or unexpected calcification. Skull: Normal. Negative for fracture or focal lesion. Sinuses/Orbits: No acute finding. Other: None CT CERVICAL SPINE FINDINGS Alignment: Normal. Skull base and vertebrae: No acute fracture. No primary bone lesion or focal pathologic process. Soft tissues and spinal canal: No prevertebral fluid or swelling. No visible canal hematoma. Disc levels:  Degenerative changes primarily at C5-C6. Upper chest: Negative. Other: None IMPRESSION: 1. Normal noncontrast CT of the brain. 2. No acute/traumatic cervical spine pathology. Electronically Signed   By: Anner Crete M.D.   On: 12/20/2021 23:32   CT Cervical Spine Wo Contrast  Result Date: 12/20/2021 CLINICAL DATA:  Trauma. EXAM: CT HEAD WITHOUT CONTRAST CT CERVICAL SPINE WITHOUT CONTRAST TECHNIQUE: Multidetector CT imaging of the head and cervical spine was performed following the standard protocol without intravenous contrast. Multiplanar CT image reconstructions of the cervical spine were also generated. COMPARISON:  Head CT dated 09/21/2020. FINDINGS: CT HEAD FINDINGS Brain: The ventricles and sulci are appropriate size for the patient's age. The gray-white matter discrimination is preserved. There is no acute intracranial hemorrhage. No mass effect or midline shift. No extra-axial fluid collection. Vascular: No hyperdense vessel or unexpected calcification. Skull: Normal. Negative for fracture or focal lesion. Sinuses/Orbits: No acute finding. Other: None CT CERVICAL SPINE FINDINGS Alignment: Normal. Skull base and vertebrae: No acute fracture. No primary bone lesion or focal pathologic process. Soft tissues and spinal canal: No prevertebral fluid or swelling. No visible canal hematoma. Disc levels:  Degenerative changes primarily at C5-C6. Upper  chest: Negative. Other: None IMPRESSION: 1. Normal noncontrast CT of the brain. 2. No acute/traumatic cervical spine pathology. Electronically Signed   By: Anner Crete M.D.   On: 12/20/2021 23:32    Procedures Procedures   Medications Ordered in ED Medications  sodium chloride 0.9 % bolus 1,000 mL (1,000 mLs Intravenous New Bag/Given 12/20/21 2307)    ED Course  I have reviewed the triage vital signs and the nursing notes.  Pertinent labs & imaging results that were available during my care of the patient were reviewed by me and considered in my medical decision making (see chart for details).    MDM Rules/Calculators/A&P 50 year old female presenting after a head injury that occurred on Tuesday.  She and her husband are unsure whether or not she had a syncopal episode or seizure due to her history of both.    Work-up: -Lab work revealing of a creatinine of 1.08, much lower than her baseline.  GFR also normal.  -Urinalysis with hematuria, this appears to be chronic, likely due to patient's kidney disease. -CBC without signs of anemia -CT of head and neck are both negative. -Orthostatics negative and EKG in normal sinus, confirmed by MD Schlossman  Because patient did not present earlier this week, it is more difficult to say what caused her syncope.  I suspect it may be due to dehydration due to her preceding illness.  Also could be a seizure with a different presentation  than her previous per her husband.  Final Clinical Impression(s) / ED Diagnoses Final diagnoses:  Loss of consciousness ([place])    Rx / DC Orders Results and diagnoses were explained to the patient. Return precautions discussed in full. Patient had no additional questions and expressed complete understanding.    This chart was dictated using voice recognition software.  Despite best efforts to proofread,  errors can occur which can change the documentation meaning.    Rhae Hammock, PA-C 12/21/21  0013    Gareth Morgan, MD 12/24/21 1700

## 2021-12-21 LAB — HEPATIC FUNCTION PANEL
ALT: 22 U/L (ref 0–44)
AST: 32 U/L (ref 15–41)
Albumin: 4 g/dL (ref 3.5–5.0)
Alkaline Phosphatase: 73 U/L (ref 38–126)
Bilirubin, Direct: 0.1 mg/dL (ref 0.0–0.2)
Indirect Bilirubin: 0.4 mg/dL (ref 0.3–0.9)
Total Bilirubin: 0.5 mg/dL (ref 0.3–1.2)
Total Protein: 7.1 g/dL (ref 6.5–8.1)

## 2022-02-26 ENCOUNTER — Emergency Department (HOSPITAL_BASED_OUTPATIENT_CLINIC_OR_DEPARTMENT_OTHER): Payer: No Typology Code available for payment source

## 2022-02-26 ENCOUNTER — Emergency Department (HOSPITAL_BASED_OUTPATIENT_CLINIC_OR_DEPARTMENT_OTHER)
Admission: EM | Admit: 2022-02-26 | Discharge: 2022-02-26 | Disposition: A | Discharge status: Home / Self Care | Payer: No Typology Code available for payment source | Attending: Emergency Medicine | Admitting: Emergency Medicine

## 2022-02-26 ENCOUNTER — Other Ambulatory Visit: Payer: Self-pay

## 2022-02-26 ENCOUNTER — Encounter (HOSPITAL_BASED_OUTPATIENT_CLINIC_OR_DEPARTMENT_OTHER): Payer: Self-pay | Admitting: Emergency Medicine

## 2022-02-26 DIAGNOSIS — R42 Dizziness and giddiness: Secondary | ICD-10-CM | POA: Insufficient documentation

## 2022-02-26 DIAGNOSIS — M7989 Other specified soft tissue disorders: Secondary | ICD-10-CM | POA: Insufficient documentation

## 2022-02-26 DIAGNOSIS — R0789 Other chest pain: Secondary | ICD-10-CM | POA: Insufficient documentation

## 2022-02-26 DIAGNOSIS — R110 Nausea: Secondary | ICD-10-CM | POA: Insufficient documentation

## 2022-02-26 DIAGNOSIS — R531 Weakness: Secondary | ICD-10-CM | POA: Insufficient documentation

## 2022-02-26 DIAGNOSIS — R0602 Shortness of breath: Secondary | ICD-10-CM | POA: Insufficient documentation

## 2022-02-26 LAB — BASIC METABOLIC PANEL
Anion gap: 9 (ref 5–15)
BUN: 18 mg/dL (ref 6–20)
CO2: 30 mmol/L (ref 22–32)
Calcium: 8.8 mg/dL — ABNORMAL LOW (ref 8.9–10.3)
Chloride: 97 mmol/L — ABNORMAL LOW (ref 98–111)
Creatinine, Ser: 1.53 mg/dL — ABNORMAL HIGH (ref 0.44–1.00)
GFR, Estimated: 41 mL/min — ABNORMAL LOW (ref >60)
Glucose, Bld: 130 mg/dL — ABNORMAL HIGH (ref 70–99)
Potassium: 3.9 mmol/L (ref 3.5–5.1)
Sodium: 136 mmol/L (ref 135–145)

## 2022-02-26 LAB — HEPATIC FUNCTION PANEL
ALT: 21 U/L (ref 0–44)
AST: 40 U/L (ref 15–41)
Albumin: 3.9 g/dL (ref 3.5–5.0)
Alkaline Phosphatase: 74 U/L (ref 38–126)
Bilirubin, Direct: 0.2 mg/dL (ref 0.0–0.2)
Indirect Bilirubin: 0.4 mg/dL (ref 0.3–0.9)
Total Bilirubin: 0.6 mg/dL (ref 0.3–1.2)
Total Protein: 7.3 g/dL (ref 6.5–8.1)

## 2022-02-26 LAB — CBC
HCT: 39.9 % (ref 36.0–46.0)
Hemoglobin: 13.3 g/dL (ref 12.0–15.0)
MCH: 29.8 pg (ref 26.0–34.0)
MCHC: 33.3 g/dL (ref 30.0–36.0)
MCV: 89.5 fL (ref 80.0–100.0)
Platelets: 123 10*3/uL — ABNORMAL LOW (ref 150–400)
RBC: 4.46 MIL/uL (ref 3.87–5.11)
RDW: 13.2 % (ref 11.5–15.5)
WBC: 8.2 10*3/uL (ref 4.0–10.5)
nRBC: 0 % (ref 0.0–0.2)

## 2022-02-26 LAB — PREGNANCY, URINE: Preg Test, Ur: NEGATIVE

## 2022-02-26 LAB — BRAIN NATRIURETIC PEPTIDE: B Natriuretic Peptide: 11.1 pg/mL (ref 0.0–100.0)

## 2022-02-26 LAB — TROPONIN I (HIGH SENSITIVITY)
Troponin I (High Sensitivity): 2 ng/L (ref <18)
Troponin I (High Sensitivity): <2 ng/L (ref <18)

## 2022-02-26 MED ORDER — IOHEXOL 350 MG/ML SOLN
100.0000 mL | Freq: Once | INTRAVENOUS | Status: AC | PRN
  Administered 2022-02-26: 80 mL via INTRAVENOUS

## 2022-02-26 NOTE — ED Notes (Signed)
Ambulated from lobby to room 7, SpO2 95-98%, HR 80s, resp unlabored.  ?

## 2022-02-26 NOTE — ED Triage Notes (Signed)
Pt reports shortness of breath and chest pain for the past week. Also reports she has been gaining weight over this time as well. Hx of diastolic heart failure. Denies cough or fever at home. ?

## 2022-02-26 NOTE — Discharge Instructions (Addendum)
Return to ED with any new symptoms such as new onset chest pain, lightheadedness, dizziness ?Please follow-up with your PCP and your cardiologist.  Please call your cardiologist today or tomorrow make an appointment for sometime this week. ?Please continue to take all medications as prescribed. ?

## 2022-02-26 NOTE — ED Provider Notes (Signed)
[facility] Provider Note   CSN: [id number] Arrival date & time: 02/26/22  [id number]     History  Chief Complaint  Patient presents with   Shortness of Breath   Chest Pain    [name] is a 51 y.o. female with extensive medical history that includes diastolic heart failure, cirrhosis, stage III kidney disease, iron deficiency anemia, obesity, diabetes type 2, GERD, gastric bypass, depression, chronic abdominal pain.  Patient presents to ED with chief complaint of shortness of breath over the last few weeks.  Patient states in the last 5 days she has gained 8 pounds.  Patient also continues that she "has to physically think about breathing".  Patient includes that this morning she began developing chest pain that was located in the middle to left side of her chest that radiated into her jaw.  Patient states that the chest pain comes and goes and is described as a pressure.  Patient denies any chest pain during the duration of the interview.  Patient reports that she has a prescription for Lasix and has been taking it as directed but it has not seemed to help.  Patient endorses shortness of breath, chest pain, nausea, leg swelling, lightheadedness, weakness.  Patient denies fevers, abdominal pain, diarrhea, palpitations.   Shortness of Breath Associated symptoms: chest pain   Associated symptoms: no fever and no vomiting   Chest Pain Associated symptoms: nausea, shortness of breath and weakness   Associated symptoms: no fever, no palpitations and no vomiting       Home Medications Prior to Admission medications   Medication Sig Start Date End Date Taking? Authorizing Provider  allopurinol (ZYLOPRIM) 100 MG tablet Take 100 mg by mouth daily.    [provider]  ALPRAZolam Duanne Moron) 1 MG tablet Take 1 mg by mouth 3 (three) times daily as needed for anxiety. 02/10/21   [provider]  atorvastatin (LIPITOR) 40 MG tablet Take 40 mg at bedtime  by mouth. 08/23/17   [provider]  estradiol (ESTRACE) 1 MG tablet Take 1 mg by mouth daily. 11/15/20   [provider]  furosemide (LASIX) 20 MG tablet Take 20 mg by mouth daily.    [provider]  gabapentin (NEURONTIN) 300 MG capsule Take 300 mg by mouth daily. 01/14/21   [provider]  lamoTRIgine (LAMICTAL) 25 MG tablet Take 100 mg by mouth 2 (two) times daily. 06/18/21   [provider]  nadolol (CORGARD) 20 MG tablet Take 0.5 tablets (10 mg total) by mouth daily. 08/11/21   Camnitz, Ocie Doyne, MD  Potassium Chloride ER 20 MEQ TBCR TAKE 1 TABLET BY MOUTH TWICE A DAY 04/21/21   Revankar, Reita Cliche, MD  TRULICITY [id number] SOPN Inject 0.75 mg every Thursday into the skin. 09/01/17   [provider]      Allergies    Bupropion, Nortriptyline, Zofran [ondansetron hcl], and Zoloft [sertraline hcl]    Review of Systems   Review of Systems  Constitutional:  Negative for chills and fever.  Respiratory:  Positive for shortness of breath.   Cardiovascular:  Positive for chest pain and leg swelling. Negative for palpitations.  Gastrointestinal:  Positive for nausea. Negative for vomiting.  Neurological:  Positive for weakness and light-headedness.  All other systems reviewed and are negative.  Physical Exam Updated Vital Signs BP 92/63 (BP Location: Left Arm)    Pulse 70    Temp 98 F (36.7 C) (Oral)    Resp  14    Ht 5\' 4"  (1.626 m)    Wt 93.4 kg    SpO2 94%    BMI 35.36 kg/m  Physical Exam Vitals and nursing note reviewed.  Constitutional:      General: She is not in acute distress.    Appearance: She is not ill-appearing, toxic-appearing or diaphoretic.  HENT:     Head: Normocephalic and atraumatic.     Mouth/Throat:     Mouth: Mucous membranes are moist.  Eyes:     Extraocular Movements: Extraocular movements intact.     Pupils: Pupils are equal, round, and reactive to light.  Neck:     Vascular: No JVD.  Cardiovascular:      Rate and Rhythm: Normal rate and regular rhythm.  Pulmonary:     Effort: Pulmonary effort is normal. No tachypnea, bradypnea, accessory muscle usage or respiratory distress.     Breath sounds: Normal breath sounds. No decreased breath sounds, wheezing, rhonchi or rales.  Abdominal:     General: Bowel sounds are normal.     Palpations: Abdomen is soft. There is no mass.  Musculoskeletal:     Cervical back: Normal range of motion.     Right lower leg: No tenderness. No edema.     Left lower leg: No tenderness. No edema.  Skin:    General: Skin is warm and dry.     Capillary Refill: Capillary refill takes less than 2 seconds.  Neurological:     Mental Status: She is alert and oriented to person, place, and time.     GCS: GCS eye subscore is 4. GCS verbal subscore is 5. GCS motor subscore is 6.     Cranial Nerves: Cranial nerves 2-12 are intact. No cranial nerve deficit.     Sensory: Sensation is intact. No sensory deficit.     Motor: Motor function is intact.     Coordination: Coordination is intact. Heel to Shin Test normal.    ED Results / Procedures / Treatments   Labs (all labs ordered are listed, but only abnormal results are displayed) Labs Reviewed  BASIC METABOLIC PANEL - Abnormal; Notable for the following components:      Result Value   Chloride 97 (*)    Glucose, Bld 130 (*)    Creatinine, Ser 1.53 (*)    Calcium 8.8 (*)    GFR, Estimated 41 (*)    All other components within normal limits  CBC - Abnormal; Notable for the following components:   Platelets 123 (*)    All other components within normal limits  PREGNANCY, URINE  BRAIN NATRIURETIC PEPTIDE  HEPATIC FUNCTION PANEL  TROPONIN I (HIGH SENSITIVITY)  TROPONIN I (HIGH SENSITIVITY)    EKG EKG Interpretation  Date/Time:  Thursday February 26 2022 08:34:37 EST Ventricular Rate:  72 PR Interval:  154 QRS Duration: 95 QT Interval:  442 QTC Calculation: 484 R Axis:   30 Text Interpretation: Sinus rhythm  Confirmed by Fredia Sorrow [phone]) on 02/26/2022 8:42:12 AM  Radiology DG Chest 2 View  Result Date: 02/26/2022 CLINICAL DATA:  Shortness of breath, chest pain. EXAM: CHEST - 2 VIEW COMPARISON:  August 13, 2021. FINDINGS: The heart size and mediastinal contours are within normal limits. Both lungs are clear. The visualized skeletal structures are unremarkable. IMPRESSION: No active cardiopulmonary disease. Electronically Signed   By: Marijo Conception M.D.   On: 02/26/2022 09:11   CT Angio Chest PE W and/or Wo Contrast  Result Date: 02/26/2022 CLINICAL DATA:  Shortness of breath. EXAM: CT ANGIOGRAPHY CHEST WITH CONTRAST TECHNIQUE: Multidetector CT imaging of the chest was performed using the standard protocol during bolus administration of intravenous contrast. Multiplanar CT image reconstructions and MIPs were obtained to evaluate the vascular anatomy. RADIATION DOSE REDUCTION: This exam was performed according to the departmental dose-optimization program which includes automated exposure control, adjustment of the mA and/or kV according to patient size and/or use of iterative reconstruction technique. CONTRAST:  47mL OMNIPAQUE IOHEXOL 350 MG/ML SOLN COMPARISON:  August 13, 2021. FINDINGS: Cardiovascular: Satisfactory opacification of the pulmonary arteries to the segmental level. No evidence of pulmonary embolism. Normal heart size. No pericardial effusion. Mediastinum/Nodes: No enlarged mediastinal, hilar, or axillary lymph nodes. Thyroid gland, trachea, and esophagus demonstrate no significant findings. Lungs/Pleura: No pneumothorax or pleural effusion is noted. Left lung is clear. Mild right opacity is noted posteriorly in right upper lobe concerning for focal atelectasis or inflammation. Upper Abdomen: No acute abnormality. Musculoskeletal: No chest wall abnormality. No acute or significant osseous findings. Review of the MIP images confirms the above findings. IMPRESSION: No definite evidence of  pulmonary embolus. Small focal opacity seen posteriorly in right upper lobe concerning for focal atelectasis or possibly inflammation. Electronically Signed   By: Marijo Conception M.D.   On: 02/26/2022 13:56    Procedures Procedures    Medications Ordered in ED Medications  iohexol (OMNIPAQUE) 350 MG/ML injection 100 mL (80 mLs Intravenous Contrast Given 02/26/22 1327)    ED Course/ Medical Decision Making/ A&P                           Medical Decision Making Amount and/or Complexity of Data Reviewed Labs: ordered. Radiology: ordered.  Risk Prescription drug management.   51 year old female presents to ED for evaluation of shortness of breath and chest pain.  See HPI for further details.  On examination, the patient is afebrile, nontachycardic, not hypoxic, clear lung sounds bilaterally, soft compressible abdomen. Patient has no lower extremity edema.  Patient's abdomen is soft, nondistended.  No sign of volume overload.  Patient worked up utilizing following labs and imaging studies personally interpreted by me: - Troponin x2.  Initial troponin at 2, second troponin at less than 2. - BNP unremarkable - Hepatic function panel unremarkable - BMP unremarkable.  Patient kidney function remains at baseline - CBC unremarkable -Chest x-ray does not show any signs of consolidation, effusion, atelectasis, mediastinal widening -CT angio does not show any definite sign of pulmonary embolism.  At this time, the patient is stable for discharge.  Patient and her case discussed with Dr. Rogene Houston, my attending, who is in agreement with the plan for management.  I have advised the patient to follow-up with her cardiologist as an outpatient.  Prior to discharge, patient was ambulated.  She stated that she did not have any lightheadedness or dizziness.  The patient reports that her blood pressure is typically always low, this has been a issue for her for as long she can remember she states.  Patient  provided with return precautions she voiced understanding.  Patient had all her questions answered to her satisfaction.  Patient stable at this time for discharge   Final Clinical Impression(s) / ED Diagnoses Final diagnoses:  Shortness of breath    Rx / DC Orders ED Discharge Orders     None         Azucena Cecil, PA-C 02/26/22 1421    Fredia Sorrow, MD 02/28/22  1820 ° °

## 2022-06-02 ENCOUNTER — Other Ambulatory Visit: Payer: Self-pay | Admitting: Cardiology

## 2022-06-26 ENCOUNTER — Other Ambulatory Visit: Payer: Self-pay | Admitting: Cardiology

## 2022-09-22 ENCOUNTER — Other Ambulatory Visit: Payer: Self-pay | Admitting: Cardiology

## 2022-10-14 ENCOUNTER — Other Ambulatory Visit: Payer: Self-pay | Admitting: Cardiology

## 2022-10-19 ENCOUNTER — Emergency Department (HOSPITAL_BASED_OUTPATIENT_CLINIC_OR_DEPARTMENT_OTHER): Payer: No Typology Code available for payment source

## 2022-10-19 ENCOUNTER — Other Ambulatory Visit: Payer: Self-pay

## 2022-10-19 ENCOUNTER — Emergency Department (HOSPITAL_BASED_OUTPATIENT_CLINIC_OR_DEPARTMENT_OTHER)
Admission: EM | Admit: 2022-10-19 | Discharge: 2022-10-19 | Disposition: A | Discharge status: Home / Self Care | Payer: No Typology Code available for payment source | Attending: Emergency Medicine | Admitting: Emergency Medicine

## 2022-10-19 ENCOUNTER — Encounter (HOSPITAL_BASED_OUTPATIENT_CLINIC_OR_DEPARTMENT_OTHER): Payer: Self-pay | Admitting: Emergency Medicine

## 2022-10-19 DIAGNOSIS — R079 Chest pain, unspecified: Secondary | ICD-10-CM | POA: Insufficient documentation

## 2022-10-19 DIAGNOSIS — J069 Acute upper respiratory infection, unspecified: Secondary | ICD-10-CM | POA: Diagnosis not present

## 2022-10-19 DIAGNOSIS — R5383 Other fatigue: Secondary | ICD-10-CM | POA: Diagnosis not present

## 2022-10-19 LAB — CBC WITH DIFFERENTIAL/PLATELET
Abs Immature Granulocytes: 0.01 10*3/uL (ref 0.00–0.07)
Basophils Absolute: 0.1 10*3/uL (ref 0.0–0.1)
Basophils Relative: 1 %
Eosinophils Absolute: 0.1 10*3/uL (ref 0.0–0.5)
Eosinophils Relative: 2 %
HCT: 39.4 % (ref 36.0–46.0)
Hemoglobin: 13.2 g/dL (ref 12.0–15.0)
Immature Granulocytes: 0 %
Lymphocytes Relative: 45 %
Lymphs Abs: 2.2 10*3/uL (ref 0.7–4.0)
MCH: 29.7 pg (ref 26.0–34.0)
MCHC: 33.5 g/dL (ref 30.0–36.0)
MCV: 88.5 fL (ref 80.0–100.0)
Monocytes Absolute: 0.3 10*3/uL (ref 0.1–1.0)
Monocytes Relative: 6 %
Neutro Abs: 2.3 10*3/uL (ref 1.7–7.7)
Neutrophils Relative %: 46 %
Platelets: 153 10*3/uL (ref 150–400)
RBC: 4.45 MIL/uL (ref 3.87–5.11)
RDW: 13.5 % (ref 11.5–15.5)
WBC: 5 10*3/uL (ref 4.0–10.5)
nRBC: 0 % (ref 0.0–0.2)

## 2022-10-19 LAB — BASIC METABOLIC PANEL
Anion gap: 4 — ABNORMAL LOW (ref 5–15)
BUN: 10 mg/dL (ref 6–20)
CO2: 27 mmol/L (ref 22–32)
Calcium: 8.7 mg/dL — ABNORMAL LOW (ref 8.9–10.3)
Chloride: 108 mmol/L (ref 98–111)
Creatinine, Ser: 1.23 mg/dL — ABNORMAL HIGH (ref 0.44–1.00)
GFR, Estimated: 54 mL/min — ABNORMAL LOW (ref >60)
Glucose, Bld: 114 mg/dL — ABNORMAL HIGH (ref 70–99)
Potassium: 4.7 mmol/L (ref 3.5–5.1)
Sodium: 139 mmol/L (ref 135–145)

## 2022-10-19 LAB — HEPATIC FUNCTION PANEL
ALT: 47 U/L — ABNORMAL HIGH (ref 0–44)
AST: 61 U/L — ABNORMAL HIGH (ref 15–41)
Albumin: 3.5 g/dL (ref 3.5–5.0)
Alkaline Phosphatase: 61 U/L (ref 38–126)
Bilirubin, Direct: <0.1 mg/dL (ref 0.0–0.2)
Total Bilirubin: 0.8 mg/dL (ref 0.3–1.2)
Total Protein: 6.1 g/dL — ABNORMAL LOW (ref 6.5–8.1)

## 2022-10-19 LAB — TROPONIN I (HIGH SENSITIVITY)
Troponin I (High Sensitivity): 2 ng/L (ref <18)
Troponin I (High Sensitivity): <2 ng/L (ref <18)

## 2022-10-19 MED ORDER — ASPIRIN 81 MG PO CHEW
324.0000 mg | CHEWABLE_TABLET | Freq: Once | ORAL | Status: AC
  Administered 2022-10-19: 324 mg via ORAL
  Filled 2022-10-19: qty 4

## 2022-10-19 MED ORDER — ONDANSETRON 4 MG PO TBDP: ORAL_TABLET | ORAL | 0 refills | Status: DC

## 2022-10-19 MED ORDER — SODIUM CHLORIDE 0.9 % IV BOLUS
1000.0000 mL | Freq: Once | INTRAVENOUS | Status: AC
  Administered 2022-10-19: 1000 mL via INTRAVENOUS

## 2022-10-19 MED ORDER — BENZONATATE 100 MG PO CAPS: 100.0000 mg | ORAL_CAPSULE | Freq: Three times a day (TID) | ORAL | 0 refills | Status: DC

## 2022-10-19 NOTE — Discharge Instructions (Signed)
Most likely your chest pain is due to your viral illness.  Please follow-up with your family doctor in the office.  Tylenol and ibuprofen or naproxen for discomfort.

## 2022-10-19 NOTE — ED Provider Notes (Signed)
[facility] Provider Note   CSN: [id number] Arrival date & time: 10/19/22  7425     History  Chief Complaint  Patient presents with   Chest Pain    [name] is a 51 y.o. female.  51 yo F with a chief complaints of not feeling well.  The patient states that she has been very fatigued and has had some chest discomfort for the past 2 to 3 days.  Worsening overnight for like it radiated into her neck.  Seem to occur at rest.  She has been suffering from an upper respiratory illness.  Had been seen in urgent care about 10 days ago.  They told her that they thought that maybe she had had a heart attack in the remote past and that she had pneumonia.  She was not started on antibiotics.  Was given an inhaler.  The patient since then has had persistent discomfort and fatigue.  Patient denies history of MI, denies hypertension or smoking.  Has a history of hyperlipidemia and diabetes.  Father had an MI in his 46s.  Patient denies history of PE or DVT denies hemoptysis denies unilateral lower extremity edema denies recent surgery immobilization hospitalization  or history of cancer.  She is on estrogen supplementation status post oophorectomy     Chest Pain      Home Medications Prior to Admission medications   Medication Sig Start Date End Date Taking? Authorizing Provider  benzonatate (TESSALON) 100 MG capsule Take 1 capsule (100 mg total) by mouth every 8 (eight) hours. 10/19/22  Yes Melene Plan, DO  ondansetron (ZOFRAN-ODT) 4 MG disintegrating tablet 4mg  ODT q4 hours prn nausea/vomit 10/19/22  Yes 10/21/22, DO  allopurinol (ZYLOPRIM) 100 MG tablet Take 100 mg by mouth daily.    [provider]  ALPRAZolam Melene Plan) 1 MG tablet Take 1 mg by mouth 3 (three) times daily as needed for anxiety. 02/10/21   [provider]  atorvastatin (LIPITOR) 40 MG tablet Take 40 mg at bedtime by mouth. 08/23/17   [provider]  estradiol  (ESTRACE) 1 MG tablet Take 1 mg by mouth daily. 11/15/20   [provider]  furosemide (LASIX) 20 MG tablet Take 20 mg by mouth daily.    [provider]  gabapentin (NEURONTIN) 300 MG capsule Take 300 mg by mouth daily. 01/14/21   [provider]  lamoTRIgine (LAMICTAL) 25 MG tablet Take 100 mg by mouth 2 (two) times daily. 06/18/21   [provider]  nadolol (CORGARD) 20 MG tablet Take 1 tablet (20 mg total) by mouth daily. 09/22/22   Camnitz, 09/24/22, MD  Potassium Chloride ER 20 MEQ TBCR TAKE 1 TABLET BY MOUTH TWICE A DAY 04/21/21   Revankar, 04/23/21, MD  TRULICITY 0.75 MG/0.5ML SOPN Inject 0.75 mg every Thursday into the skin. 09/01/17   [provider]      Allergies    Bupropion, Nortriptyline, Zofran [ondansetron hcl], and Zoloft [sertraline hcl]    Review of Systems   Review of Systems  Cardiovascular:  Positive for chest pain.    Physical Exam Updated Vital Signs BP 125/85   Pulse 64   Temp 97.7 F (36.5 C) (Oral)   Resp 15   Ht 5\' 4"  (1.626 m)   Wt 85.5 kg   SpO2 96%   BMI 32.34 kg/m  Physical Exam Vitals and nursing note reviewed.  Constitutional:      General: She is not in acute  distress.    Appearance: She is well-developed. She is not diaphoretic.  HENT:     Head: Normocephalic and atraumatic.  Eyes:     Pupils: Pupils are equal, round, and reactive to light.  Cardiovascular:     Rate and Rhythm: Normal rate and regular rhythm.     Heart sounds: No murmur heard.    No friction rub. No gallop.  Pulmonary:     Effort: Pulmonary effort is normal.     Breath sounds: No wheezing or rales.  Chest:     Chest wall: Tenderness present.     Comments: Pain over the left anterior chest reproduces her discomfort. Abdominal:     General: There is no distension.     Palpations: Abdomen is soft.     Tenderness: There is no abdominal tenderness.  Musculoskeletal:        General: No tenderness.     Cervical back: Normal  range of motion and neck supple.  Skin:    General: Skin is warm and dry.  Neurological:     Mental Status: She is alert and oriented to person, place, and time.  Psychiatric:        Behavior: Behavior normal.     ED Results / Procedures / Treatments   Labs (all labs ordered are listed, but only abnormal results are displayed) Labs Reviewed  BASIC METABOLIC PANEL - Abnormal; Notable for the following components:      Result Value   Glucose, Bld 114 (*)    Creatinine, Ser 1.23 (*)    Calcium 8.7 (*)    GFR, Estimated 54 (*)    Anion gap 4 (*)    All other components within normal limits  HEPATIC FUNCTION PANEL - Abnormal; Notable for the following components:   Total Protein 6.1 (*)    AST 61 (*)    ALT 47 (*)    All other components within normal limits  CBC WITH DIFFERENTIAL/PLATELET  TROPONIN I (HIGH SENSITIVITY)  TROPONIN I (HIGH SENSITIVITY)    EKG EKG Interpretation  Date/Time:  Monday October 19 2022 06:14:59 EDT Ventricular Rate:  78 PR Interval:  146 QRS Duration: 78 QT Interval:  398 QTC Calculation: 453 R Axis:   18 Text Interpretation: Normal sinus rhythm Cannot rule out Anterior infarct , age undetermined Abnormal ECG When compared with ECG of 26-Feb-2022 08:34, No significant change since last tracing Confirmed by Susy Frizzle [phone]) on 10/19/2022 6:29:44 AM  Radiology DG Chest 2 View  Result Date: 10/19/2022 CLINICAL DATA:  Chest pain, question recent pneumonia EXAM: CHEST - 2 VIEW COMPARISON:  02/26/2022 FINDINGS: Low volume chest. There is no edema, consolidation, effusion, or pneumothorax. Normal heart size and mediastinal contours. Artifact from EKG leads. IMPRESSION: Negative low volume chest. Electronically Signed   By: Tiburcio Pea M.D.   On: 10/19/2022 06:41    Procedures Procedures    Medications Ordered in ED Medications  sodium chloride 0.9 % bolus 1,000 mL (0 mLs Intravenous Stopped 10/19/22 0834)  aspirin chewable tablet 324  mg (324 mg Oral Given 10/19/22 0736)    ED Course/ Medical Decision Making/ A&P                           Medical Decision Making Amount and/or Complexity of Data Reviewed Labs: ordered.  Risk OTC drugs. Prescription drug management.   51 yo F with a chief complaints of chest pain.  This is atypical in nature.  Going on for couple days.  Is occurring in the context of recent upper respiratory illness.  I am able to reproduce her pain on exam.  She does have multiple risk factors for MI.  Pain started less than 6 hours ago.  Will obtain 2 troponins.  Chest x-ray independently interpreted by me without focal infiltrate or pneumothorax.  Initial troponin is negative.  EKG is nonischemic.   On my record review the patient does have a significant past medical history of cirrhosis.  We will add on LFTs.  The patient does appear clinically dehydrated we will give a bolus of IV fluids.  LFTs are unremarkable.  2 troponins are negative.  We will discharge the patient home.  PCP follow-up.  9:31 AM:  I have discussed the diagnosis/risks/treatment options with the patient and family.  Evaluation and diagnostic testing in the emergency department does not suggest an emergent condition requiring admission or immediate intervention beyond what has been performed at this time.  They will follow up with PCP. We also discussed returning to the ED immediately if new or worsening sx occur. We discussed the sx which are most concerning (e.g., sudden worsening pain, fever, inability to tolerate by mouth) that necessitate immediate return. Medications administered to the patient during their visit and any new prescriptions provided to the patient are listed below.  Medications given during this visit Medications  sodium chloride 0.9 % bolus 1,000 mL (0 mLs Intravenous Stopped 10/19/22 0834)  aspirin chewable tablet 324 mg (324 mg Oral Given 10/19/22 0736)     The patient appears reasonably screen and/or  stabilized for discharge and I doubt any other medical condition or other [facility] requiring further screening, evaluation, or treatment in the ED at this time prior to discharge.          Final Clinical Impression(s) / ED Diagnoses Final diagnoses:  Nonspecific chest pain    Rx / DC Orders ED Discharge Orders          Ordered    benzonatate (TESSALON) 100 MG capsule  Every 8 hours        10/19/22 0930    ondansetron (ZOFRAN-ODT) 4 MG disintegrating tablet        10/19/22 [place], [place], DO 10/19/22 [phone]

## 2022-10-19 NOTE — ED Triage Notes (Signed)
Chest pressure x 2 weeks, pt was seen at [facility] and told she had PNA and "had had a heart attack at some point". Pt states that around 1700 last night she started having a stabbing pain in her chest under her L breast and SHOB that started Saturday. Pts father had first MI at 50yo. Reports pain to L jaw and shoulder blade as well

## 2022-10-20 ENCOUNTER — Other Ambulatory Visit: Payer: Self-pay

## 2022-10-20 MED ORDER — NADOLOL 20 MG PO TABS: 20.0000 mg | ORAL_TABLET | Freq: Every day | ORAL | 0 refills | Status: DC

## 2023-01-17 ENCOUNTER — Emergency Department (HOSPITAL_BASED_OUTPATIENT_CLINIC_OR_DEPARTMENT_OTHER): Payer: No Typology Code available for payment source

## 2023-01-17 ENCOUNTER — Other Ambulatory Visit: Payer: Self-pay

## 2023-01-17 ENCOUNTER — Encounter (HOSPITAL_BASED_OUTPATIENT_CLINIC_OR_DEPARTMENT_OTHER): Payer: Self-pay | Admitting: Emergency Medicine

## 2023-01-17 ENCOUNTER — Emergency Department (HOSPITAL_BASED_OUTPATIENT_CLINIC_OR_DEPARTMENT_OTHER)
Admission: EM | Admit: 2023-01-17 | Discharge: 2023-01-17 | Disposition: A | Discharge status: Home / Self Care | Payer: No Typology Code available for payment source | Attending: Emergency Medicine | Admitting: Emergency Medicine

## 2023-01-17 DIAGNOSIS — E1122 Type 2 diabetes mellitus with diabetic chronic kidney disease: Secondary | ICD-10-CM | POA: Diagnosis not present

## 2023-01-17 DIAGNOSIS — J101 Influenza due to other identified influenza virus with other respiratory manifestations: Secondary | ICD-10-CM | POA: Insufficient documentation

## 2023-01-17 DIAGNOSIS — J111 Influenza due to unidentified influenza virus with other respiratory manifestations: Secondary | ICD-10-CM

## 2023-01-17 DIAGNOSIS — Z20822 Contact with and (suspected) exposure to covid-19: Secondary | ICD-10-CM | POA: Diagnosis not present

## 2023-01-17 DIAGNOSIS — N189 Chronic kidney disease, unspecified: Secondary | ICD-10-CM | POA: Diagnosis not present

## 2023-01-17 DIAGNOSIS — R5383 Other fatigue: Secondary | ICD-10-CM | POA: Diagnosis present

## 2023-01-17 LAB — COMPREHENSIVE METABOLIC PANEL
ALT: 21 U/L (ref 0–44)
AST: 31 U/L (ref 15–41)
Albumin: 3.7 g/dL (ref 3.5–5.0)
Alkaline Phosphatase: 71 U/L (ref 38–126)
Anion gap: 8 (ref 5–15)
BUN: 11 mg/dL (ref 6–20)
CO2: 26 mmol/L (ref 22–32)
Calcium: 8.5 mg/dL — ABNORMAL LOW (ref 8.9–10.3)
Chloride: 106 mmol/L (ref 98–111)
Creatinine, Ser: 0.93 mg/dL (ref 0.44–1.00)
GFR, Estimated: >60 mL/min (ref >60)
Glucose, Bld: 88 mg/dL (ref 70–99)
Potassium: 3.4 mmol/L — ABNORMAL LOW (ref 3.5–5.1)
Sodium: 140 mmol/L (ref 135–145)
Total Bilirubin: 0.8 mg/dL (ref 0.3–1.2)
Total Protein: 7 g/dL (ref 6.5–8.1)

## 2023-01-17 LAB — CBC WITH DIFFERENTIAL/PLATELET
Abs Immature Granulocytes: 0 10*3/uL (ref 0.00–0.07)
Basophils Absolute: 0 10*3/uL (ref 0.0–0.1)
Basophils Relative: 1 %
Eosinophils Absolute: 0.1 10*3/uL (ref 0.0–0.5)
Eosinophils Relative: 2 %
HCT: 41.5 % (ref 36.0–46.0)
Hemoglobin: 13.7 g/dL (ref 12.0–15.0)
Immature Granulocytes: 0 %
Lymphocytes Relative: 59 %
Lymphs Abs: 2 10*3/uL (ref 0.7–4.0)
MCH: 29.3 pg (ref 26.0–34.0)
MCHC: 33 g/dL (ref 30.0–36.0)
MCV: 88.9 fL (ref 80.0–100.0)
Monocytes Absolute: 0.2 10*3/uL (ref 0.1–1.0)
Monocytes Relative: 6 %
Neutro Abs: 1.1 10*3/uL — ABNORMAL LOW (ref 1.7–7.7)
Neutrophils Relative %: 32 %
Platelets: 145 10*3/uL — ABNORMAL LOW (ref 150–400)
RBC: 4.67 MIL/uL (ref 3.87–5.11)
RDW: 12.4 % (ref 11.5–15.5)
WBC: 3.4 10*3/uL — ABNORMAL LOW (ref 4.0–10.5)
nRBC: 0 % (ref 0.0–0.2)

## 2023-01-17 LAB — CBG MONITORING, ED: Glucose-Capillary: 76 mg/dL (ref 70–99)

## 2023-01-17 LAB — AMMONIA: Ammonia: 13 umol/L (ref 9–35)

## 2023-01-17 LAB — RESP PANEL BY RT-PCR (RSV, FLU A&B, COVID)  RVPGX2
Influenza A by PCR: POSITIVE — AB
Influenza B by PCR: NEGATIVE
Resp Syncytial Virus by PCR: NEGATIVE
SARS Coronavirus 2 by RT PCR: NEGATIVE

## 2023-01-17 LAB — TROPONIN I (HIGH SENSITIVITY): Troponin I (High Sensitivity): 4 ng/L (ref <18)

## 2023-01-17 LAB — GROUP A STREP BY PCR: Group A Strep by PCR: NOT DETECTED

## 2023-01-17 LAB — ETHANOL: Alcohol, Ethyl (B): <10 mg/dL (ref <10)

## 2023-01-17 MED ORDER — LOPERAMIDE HCL 2 MG PO CAPS: 4.0000 mg | ORAL_CAPSULE | Freq: Four times a day (QID) | ORAL | 0 refills | Status: DC | PRN

## 2023-01-17 MED ORDER — SODIUM CHLORIDE 0.9 % IV BOLUS
1000.0000 mL | Freq: Once | INTRAVENOUS | Status: AC
  Administered 2023-01-17: 1000 mL via INTRAVENOUS

## 2023-01-17 MED ORDER — ONDANSETRON HCL 4 MG/2ML IJ SOLN
4.0000 mg | Freq: Once | INTRAMUSCULAR | Status: AC
  Administered 2023-01-17: 4 mg via INTRAVENOUS
  Filled 2023-01-17: qty 2

## 2023-01-17 MED ORDER — KETOROLAC TROMETHAMINE 15 MG/ML IJ SOLN
15.0000 mg | Freq: Once | INTRAMUSCULAR | Status: AC
  Administered 2023-01-17: 15 mg via INTRAVENOUS
  Filled 2023-01-17: qty 1

## 2023-01-17 MED ORDER — GUAIFENESIN 100 MG/5ML PO LIQD
10.0000 mL | Freq: Once | ORAL | Status: AC
  Administered 2023-01-17: 10 mL via ORAL
  Filled 2023-01-17: qty 10

## 2023-01-17 MED ORDER — BENZONATATE 100 MG PO CAPS: 100.0000 mg | ORAL_CAPSULE | Freq: Three times a day (TID) | ORAL | 0 refills | Status: DC

## 2023-01-17 NOTE — ED Triage Notes (Signed)
Pt arrive pov with husband, to triage in wheelchair. Husband reports pt with CP, cough, fever and lethargy x 6 days. Husband states pt has cough and sore throat x 3 weeks. Hoarseness noted today, Reports decreased po intake

## 2023-01-17 NOTE — ED Notes (Signed)
X-ray at bedside

## 2023-01-17 NOTE — ED Provider Notes (Signed)
[place] [facility] Provider Note   CSN: [id number] Arrival date & time: 01/17/23  1715     History  Chief Complaint  Patient presents with   Fatigue   Altered Mental Status    [name] is a 52 y.o. female with a past medical history of hyperlipidemia, seizure disorder, type 2 diabetes, alcohol use disorder and chronic kidney disease presenting today with abnormal behavior.  She tells me that she has not been feeling well for multiple weeks.  Her husband says that they also have a granddaughter who has been sick.  Patient tells me that she is most concerned about her cough.  She is very sluggish and not very participatory in history taking but she is alert and oriented x 4.  Husband says for the past 5 to 6 days she has not been leaving the bed and has not been eating or drinking.  Says that she has required help to the bathroom.  Patient endorses diarrhea.   Altered Mental Status      Home Medications Prior to Admission medications   Medication Sig Start Date End Date Taking? Authorizing Provider  allopurinol (ZYLOPRIM) 100 MG tablet Take 100 mg by mouth daily.    [provider]  ALPRAZolam Prudy Feeler) 1 MG tablet Take 1 mg by mouth 3 (three) times daily as needed for anxiety. 02/10/21   [provider]  atorvastatin (LIPITOR) 40 MG tablet Take 40 mg at bedtime by mouth. 08/23/17   [provider]  benzonatate (TESSALON) 100 MG capsule Take 1 capsule (100 mg total) by mouth every 8 (eight) hours. 10/19/22   Melene Plan, DO  estradiol (ESTRACE) 1 MG tablet Take 1 mg by mouth daily. 11/15/20   [provider]  furosemide (LASIX) 20 MG tablet Take 20 mg by mouth daily.    [provider]  gabapentin (NEURONTIN) 300 MG capsule Take 300 mg by mouth daily. 01/14/21   [provider]  lamoTRIgine (LAMICTAL) 25 MG tablet Take 100 mg by mouth 2 (two) times daily. 06/18/21   [provider]   nadolol (CORGARD) 20 MG tablet Take 1 tablet (20 mg total) by mouth daily. 10/20/22   Camnitz, Will Daphine Deutscher, MD  ondansetron (ZOFRAN-ODT) 4 MG disintegrating tablet 4mg  ODT q4 hours prn nausea/vomit 10/19/22   10/21/22, DO  Potassium Chloride ER 20 MEQ TBCR TAKE 1 TABLET BY MOUTH TWICE A DAY 04/21/21   Revankar, 04/23/21, MD  TRULICITY 0.75 MG/0.5ML SOPN Inject 0.75 mg every Thursday into the skin. 09/01/17   [provider]      Allergies    Bupropion, Nortriptyline, Zofran [ondansetron hcl], and Zoloft [sertraline hcl]    Review of Systems   Review of Systems  Physical Exam Updated Vital Signs BP 113/81   Pulse 92   Temp 98.3 F (36.8 C) (Oral)   Resp 18   Wt 80.3 kg   SpO2 100%   BMI 30.38 kg/m  Physical Exam Vitals and nursing note reviewed.  Constitutional:      General: She is not in acute distress.    Appearance: Normal appearance. She is not ill-appearing.  HENT:     Head: Normocephalic and atraumatic.  Eyes:     General: No scleral icterus.    Conjunctiva/sclera: Conjunctivae normal.  Cardiovascular:     Rate and Rhythm: Normal rate and regular rhythm.  Pulmonary:     Effort: Pulmonary effort is normal. No respiratory distress.  Breath sounds: Rales (Bilateral lower lung fields) present.  Abdominal:     General: Abdomen is flat.     Palpations: Abdomen is soft.     Tenderness: There is no abdominal tenderness.  Skin:    General: Skin is warm and dry.     Findings: No rash.  Neurological:     Mental Status: She is alert.  Psychiatric:        Mood and Affect: Mood normal.     ED Results / Procedures / Treatments   Labs (all labs ordered are listed, but only abnormal results are displayed) Labs Reviewed  RESP PANEL BY RT-PCR (RSV, FLU A&B, COVID)  RVPGX2 - Abnormal; Notable for the following components:      Result Value   Influenza A by PCR POSITIVE (*)    All other components within normal limits  CBC WITH DIFFERENTIAL/PLATELET -  Abnormal; Notable for the following components:   WBC 3.4 (*)    Platelets 145 (*)    Neutro Abs 1.1 (*)    All other components within normal limits  COMPREHENSIVE METABOLIC PANEL - Abnormal; Notable for the following components:   Potassium 3.4 (*)    Calcium 8.5 (*)    All other components within normal limits  GROUP A STREP BY PCR  ETHANOL  AMMONIA  CBG MONITORING, ED  TROPONIN I (HIGH SENSITIVITY)    EKG None  Radiology DG Chest Portable 1 View  Result Date: 01/17/2023 CLINICAL DATA:  Cough. EXAM: PORTABLE CHEST 1 VIEW COMPARISON:  Chest x-ray October 19, 2022. FINDINGS: The heart size and mediastinal contours are within normal limits. Both lungs are clear. No visible pleural effusions or pneumothorax. No acute osseous abnormality. IMPRESSION: No active disease. Electronically Signed   By: Margaretha Sheffield M.D.   On: 01/17/2023 19:12    Procedures Procedures   Medications Ordered in ED Medications  sodium chloride 0.9 % bolus 1,000 mL (0 mLs Intravenous Stopped 01/17/23 2001)  ketorolac (TORADOL) 15 MG/ML injection 15 mg (15 mg Intravenous Given 01/17/23 1858)  guaiFENesin (ROBITUSSIN) 100 MG/5ML liquid 10 mL (10 mLs Oral Given 01/17/23 2001)  ondansetron (ZOFRAN) injection 4 mg (4 mg Intravenous Given 01/17/23 1959)    ED Course/ Medical Decision Making/ A&P                             Medical Decision Making Amount and/or Complexity of Data Reviewed Labs: ordered. Radiology: ordered.  Risk OTC drugs. Prescription drug management.   52 year old female presenting today with for fatigue, weakness, decreased appetite.  Differential includes but is not limited to COVID/flu, viral illness, gastritis, electrolyte abnormality, intracranial abnormality, intoxication.  This is not an exhaustive differential.    Past Medical History / Co-morbidities / Social History: Liver cirrhosis followed by [facility], prolonged QT followed by cardiology   Physical  Exam: Pertinent physical exam findings include Pale, dry MM, lethargic Alert and oriented, RRR, some rales in bilateral lower lung fields  Lab Tests: I ordered, and personally interpreted labs.  The pertinent results include: Unremarkable Flu positive Platelets 145, patient has a history of thrombocytopenia in the setting of liver cirrhosis.  Baseline.   Imaging Studies: I ordered patient's chest x-ray.  Viewed and interpreted this and agree that there are no acute findings.   Medications: I ordered medication including fluid bolus, Robitussin, Zofran and Toradol. Reevaluation of the patient after these medicines showed that the patient improved. I have reviewed  the patients home medicines and have made adjustments as needed.    MDM/Disposition: This is a 52 year old female who presented today with 6 days worth of fatigue, decreased appetite, diarrhea and cough.  Differential was broad.  Low suspicion encephalopathy secondary to liver cirrhosis, labs generally unremarkable.  She tested positive for the flu today.  In triage her husband reported that she was acting differently so nursing staff pursued altered mental labs.  These are unremarkable.  Patient is alert and oriented on my physical exam.  She reported feeling better after IV fluids, Toradol and Zofran.  Suspect that her symptoms are all secondary to the flu.  She was discharged with Tessalon and Imodium.  Also given over-the-counter medications that may help her symptoms.  Ultimately she will follow-up with her PCP in a week if she is not feeling better.  She and her husband are agreeable to this and were discharged in stable condition    Final Clinical Impression(s) / ED Diagnoses Final diagnoses:  Flu    Rx / DC Orders ED Discharge Orders          Ordered    loperamide (IMODIUM) 2 MG capsule  4 times daily PRN        01/17/23 1952    benzonatate (TESSALON) 100 MG capsule  Every 8 hours        01/17/23 1952            Results and diagnoses were explained to the patient. Return precautions discussed in full. Patient had no additional questions and expressed complete understanding.   This chart was dictated using voice recognition software.  Despite best efforts to proofread,  errors can occur which can change the documentation meaning.    Darliss Ridgel 01/17/23 2152    Cristie Hem, MD 01/20/23 [id number]

## 2023-01-17 NOTE — Discharge Instructions (Addendum)
You came to the emergency department today due to feeling poorly for the past couple of weeks.  For the past couple of days you have not wanting to eat, drink or get out of bed.  You tested positive for the flu today which likely is the cause of your symptoms.  This sort of viral illness is something that can be treated with over-the-counter medications like Tylenol and ibuprofen.  Other options include DayQuil/NyQuil, Mucinex for congestion, Robitussin/Delsym for cough and any other over-the-counter medications.  It is very important that you stay hydrated during this time as well.  You may use things like Powerade, Gatorade, electrolyte powders and water.  I sent benzonatate to your pharmacy for cough and loperamide for diarrhea as needed.  Ultimately, follow-up with your PCP at the end of the week or early next week if you continue to feel poorly.  We hope that you feel better and do not hesitate to return to the emergency department with any worsening symptoms, especially chest pain, shortness of breath, dizziness and loss of consciousness.

## 2023-01-17 NOTE — ED Notes (Signed)
Bruising noted to pts right knee, right medial thigh.  Spouse reports that pt fell into shelf yesterday.

## 2023-06-28 HISTORY — PX: ABDOMINOPLASTY: SUR9

## 2024-05-08 ENCOUNTER — Emergency Department (HOSPITAL_BASED_OUTPATIENT_CLINIC_OR_DEPARTMENT_OTHER): Payer: MEDICAID

## 2024-05-08 ENCOUNTER — Other Ambulatory Visit: Payer: Self-pay

## 2024-05-08 ENCOUNTER — Emergency Department (HOSPITAL_BASED_OUTPATIENT_CLINIC_OR_DEPARTMENT_OTHER)
Admission: EM | Admit: 2024-05-08 | Discharge: 2024-05-08 | Disposition: A | Discharge status: Home / Self Care | Payer: MEDICAID | Attending: Emergency Medicine | Admitting: Emergency Medicine

## 2024-05-08 ENCOUNTER — Encounter (HOSPITAL_BASED_OUTPATIENT_CLINIC_OR_DEPARTMENT_OTHER): Payer: Self-pay

## 2024-05-08 DIAGNOSIS — E119 Type 2 diabetes mellitus without complications: Secondary | ICD-10-CM | POA: Diagnosis not present

## 2024-05-08 DIAGNOSIS — J069 Acute upper respiratory infection, unspecified: Secondary | ICD-10-CM | POA: Diagnosis not present

## 2024-05-08 DIAGNOSIS — R059 Cough, unspecified: Secondary | ICD-10-CM | POA: Diagnosis present

## 2024-05-08 LAB — CBC WITH DIFFERENTIAL/PLATELET
Abs Immature Granulocytes: 0.01 10*3/uL (ref 0.00–0.07)
Basophils Absolute: 0 10*3/uL (ref 0.0–0.1)
Basophils Relative: 1 %
Eosinophils Absolute: 0.1 10*3/uL (ref 0.0–0.5)
Eosinophils Relative: 4 %
HCT: 36.2 % (ref 36.0–46.0)
Hemoglobin: 11.5 g/dL — ABNORMAL LOW (ref 12.0–15.0)
Immature Granulocytes: 0 %
Lymphocytes Relative: 41 %
Lymphs Abs: 1.6 10*3/uL (ref 0.7–4.0)
MCH: 27 pg (ref 26.0–34.0)
MCHC: 31.8 g/dL (ref 30.0–36.0)
MCV: 85 fL (ref 80.0–100.0)
Monocytes Absolute: 0.2 10*3/uL (ref 0.1–1.0)
Monocytes Relative: 5 %
Neutro Abs: 2 10*3/uL (ref 1.7–7.7)
Neutrophils Relative %: 49 %
Platelets: 138 10*3/uL — ABNORMAL LOW (ref 150–400)
RBC: 4.26 MIL/uL (ref 3.87–5.11)
RDW: 15.1 % (ref 11.5–15.5)
WBC: 4 10*3/uL (ref 4.0–10.5)
nRBC: 0 % (ref 0.0–0.2)

## 2024-05-08 LAB — LIPASE, BLOOD: Lipase: 42 U/L (ref 11–51)

## 2024-05-08 LAB — COMPREHENSIVE METABOLIC PANEL WITH GFR
ALT: 27 U/L (ref 0–44)
AST: 34 U/L (ref 15–41)
Albumin: 4.1 g/dL (ref 3.5–5.0)
Alkaline Phosphatase: 83 U/L (ref 38–126)
Anion gap: 8 (ref 5–15)
BUN: 13 mg/dL (ref 6–20)
CO2: 26 mmol/L (ref 22–32)
Calcium: 8.8 mg/dL — ABNORMAL LOW (ref 8.9–10.3)
Chloride: 106 mmol/L (ref 98–111)
Creatinine, Ser: 1.26 mg/dL — ABNORMAL HIGH (ref 0.44–1.00)
GFR, Estimated: 51 mL/min — ABNORMAL LOW (ref >60)
Glucose, Bld: 128 mg/dL — ABNORMAL HIGH (ref 70–99)
Potassium: 4.7 mmol/L (ref 3.5–5.1)
Sodium: 140 mmol/L (ref 135–145)
Total Bilirubin: 0.4 mg/dL (ref 0.0–1.2)
Total Protein: 6.6 g/dL (ref 6.5–8.1)

## 2024-05-08 LAB — URINALYSIS, W/ REFLEX TO CULTURE (INFECTION SUSPECTED)
Bilirubin Urine: NEGATIVE
Glucose, UA: NEGATIVE mg/dL
Hgb urine dipstick: NEGATIVE
Ketones, ur: NEGATIVE mg/dL
Leukocytes,Ua: NEGATIVE
Nitrite: NEGATIVE
Protein, ur: NEGATIVE mg/dL
Specific Gravity, Urine: 1.01 (ref 1.005–1.030)
pH: 6 (ref 5.0–8.0)

## 2024-05-08 LAB — TROPONIN T, HIGH SENSITIVITY: Troponin T High Sensitivity: <15 ng/L (ref <19)

## 2024-05-08 LAB — RESP PANEL BY RT-PCR (RSV, FLU A&B, COVID)  RVPGX2
Influenza A by PCR: NEGATIVE
Influenza B by PCR: NEGATIVE
Resp Syncytial Virus by PCR: NEGATIVE
SARS Coronavirus 2 by RT PCR: NEGATIVE

## 2024-05-08 MED ORDER — ALBUTEROL SULFATE HFA 108 (90 BASE) MCG/ACT IN AERS
2.0000 | INHALATION_SPRAY | Freq: Once | RESPIRATORY_TRACT | Status: AC
  Administered 2024-05-08: 2 via RESPIRATORY_TRACT
  Filled 2024-05-08: qty 6.7

## 2024-05-08 MED ORDER — IPRATROPIUM-ALBUTEROL 0.5-2.5 (3) MG/3ML IN SOLN
3.0000 mL | Freq: Once | RESPIRATORY_TRACT | Status: AC
  Administered 2024-05-08: 3 mL via RESPIRATORY_TRACT
  Filled 2024-05-08: qty 3

## 2024-05-08 MED ORDER — AEROCHAMBER PLUS FLO-VU MEDIUM MISC
1.0000 | Freq: Once | Status: AC
  Administered 2024-05-08: 1
  Filled 2024-05-08: qty 1

## 2024-05-08 MED ORDER — ALBUTEROL SULFATE (2.5 MG/3ML) 0.083% IN NEBU
5.0000 mg | INHALATION_SOLUTION | Freq: Once | RESPIRATORY_TRACT | Status: AC
  Administered 2024-05-08: 5 mg via RESPIRATORY_TRACT
  Filled 2024-05-08: qty 6

## 2024-05-08 MED ORDER — SODIUM CHLORIDE 0.9 % IV BOLUS
1000.0000 mL | Freq: Once | INTRAVENOUS | Status: AC
  Administered 2024-05-08: 1000 mL via INTRAVENOUS

## 2024-05-08 MED ORDER — BENZONATATE 100 MG PO CAPS
100.0000 mg | ORAL_CAPSULE | Freq: Once | ORAL | Status: AC
  Administered 2024-05-08: 100 mg via ORAL
  Filled 2024-05-08: qty 1

## 2024-05-08 MED ORDER — BENZONATATE 100 MG PO CAPS: 100.0000 mg | ORAL_CAPSULE | Freq: Three times a day (TID) | ORAL | 0 refills | Status: AC

## 2024-05-08 NOTE — Discharge Instructions (Signed)
 You were seen in the emergency room for coughing Your chest x-ray blood work and EKG all looked okay Your breathing felt a little better after albuterol treatments here We gave you an albuterol inhaler to go home with and also a prescription for Tessalon  Perles to help with the coughing Use the albuterol inhaler up to 2 puffs every 4 hours The most likely cause would be a viral respiratory illness which her lungs are recovering from Return to the emergency department for chest pain trouble breathing or any other concerns Otherwise follow-up with your primary care doctor in 1 week for reevaluation

## 2024-05-08 NOTE — ED Triage Notes (Signed)
 Pt arrives ambulatory to ED with c/o cough, fever, and body aches over a week has been treating with tylenol  and Nyquil also reports back pain.

## 2024-05-08 NOTE — ED Provider Notes (Signed)
 [place] [facility] Provider Note   CSN: [id number] Arrival date & time: 05/08/24  0827     History  Chief Complaint  Patient presents with   Cough    [name] is a 53 y.o. female.  With a history of type 2 diabetes, status post gastric bypass, status post appendectomy, cirrhosis and kidney stones who presents to the ED for coughing.  9 days of ongoing dry cough fevers body aches general malaise.  Right-sided rib/flank pain made worse with coughing.  Also pain in her mid back with coughing.  Has been taking over-the-counter cold medications and Tylenol  and NyQuil with minimal relief.  No nausea, vomiting, change in bowel habits.  No urinary symptoms.  Is on Ozempic.  Last checked her blood glucose at home 1 week ago and it was in the high 100s.   Cough      Home Medications Prior to Admission medications   Medication Sig Start Date End Date Taking? Authorizing Provider  benzonatate  (TESSALON ) 100 MG capsule Take 1 capsule (100 mg total) by mouth every 8 (eight) hours. 05/08/24  Yes Sallyanne Creamer, DO  allopurinol (ZYLOPRIM) 100 MG tablet Take 100 mg by mouth daily.    [provider]  ALPRAZolam  (XANAX ) 1 MG tablet Take 1 mg by mouth 3 (three) times daily as needed for anxiety. 02/10/21   [provider]  atorvastatin (LIPITOR) 40 MG tablet Take 40 mg at bedtime by mouth. 08/23/17   [provider]  estradiol (ESTRACE) 1 MG tablet Take 1 mg by mouth daily. 11/15/20   [provider]  furosemide (LASIX) 20 MG tablet Take 20 mg by mouth daily.    [provider]  gabapentin (NEURONTIN) 300 MG capsule Take 300 mg by mouth daily. 01/14/21   [provider]  lamoTRIgine (LAMICTAL) 25 MG tablet Take 100 mg by mouth 2 (two) times daily. 06/18/21   [provider]  loperamide  (IMODIUM ) 2 MG capsule Take 2 capsules (4 mg total) by mouth 4 (four) times daily as needed for diarrhea or loose  stools. 01/17/23   Redwine, Madison A, PA-C  nadolol  (CORGARD ) 20 MG tablet Take 1 tablet (20 mg total) by mouth daily. 10/20/22   Camnitz, Babetta Lesch, MD  ondansetron  (ZOFRAN -ODT) 4 MG disintegrating tablet 4mg  ODT q4 hours prn nausea/vomit 10/19/22   Floyd, Dan, DO  Potassium Chloride  ER 20 MEQ TBCR TAKE 1 TABLET BY MOUTH TWICE A DAY 04/21/21   Revankar, Micael Adas, MD  TRULICITY 0.75 MG/0.5ML SOPN Inject 0.75 mg every Thursday into the skin. 09/01/17   [provider]      Allergies    Bupropion , Nortriptyline , Zofran  [ondansetron  hcl], and Zoloft [sertraline hcl]    Review of Systems   Review of Systems  Respiratory:  Positive for cough.     Physical Exam Updated Vital Signs BP 99/64   Pulse 84   Temp (S) 97.7 F (36.5 C) (Oral) Comment: did take tylenol  aroun 7 am  Resp 16   Ht 5\' 4"  (1.626 m)   Wt 83.5 kg   SpO2 97%   BMI 31.58 kg/m  Physical Exam Vitals and nursing note reviewed.  HENT:     Head: Normocephalic and atraumatic.  Eyes:     Pupils: Pupils are equal, round, and reactive to light.  Cardiovascular:     Rate and Rhythm: Normal rate and regular rhythm.  Pulmonary:     Effort: Pulmonary effort is normal.  Breath sounds: Rhonchi present. No wheezing or rales.  Abdominal:     Palpations: Abdomen is soft.     Tenderness: There is no abdominal tenderness.  Skin:    General: Skin is warm and dry.  Neurological:     Mental Status: She is alert.  Psychiatric:        Mood and Affect: Mood normal.     ED Results / Procedures / Treatments   Labs (all labs ordered are listed, but only abnormal results are displayed) Labs Reviewed  COMPREHENSIVE METABOLIC PANEL WITH GFR - Abnormal; Notable for the following components:      Result Value   Glucose, Bld 128 (*)    Creatinine, Ser 1.26 (*)    Calcium  8.8 (*)    GFR, Estimated 51 (*)    All other components within normal limits  CBC WITH DIFFERENTIAL/PLATELET - Abnormal; Notable for the following  components:   Hemoglobin 11.5 (*)    Platelets 138 (*)    All other components within normal limits  URINALYSIS, W/ REFLEX TO CULTURE (INFECTION SUSPECTED) - Abnormal; Notable for the following components:   Bacteria, UA MANY (*)    All other components within normal limits  RESP PANEL BY RT-PCR (RSV, FLU A&B, COVID)  RVPGX2  LIPASE, BLOOD  CBG MONITORING, ED  TROPONIN T, HIGH SENSITIVITY    EKG EKG Interpretation Date/Time:  Monday May 08 2024 09:16:35 EDT Ventricular Rate:  72 PR Interval:  146 QRS Duration:  91 QT Interval:  415 QTC Calculation: 455 R Axis:   55  Text Interpretation: Sinus rhythm Confirmed by Rafael Bun [id number]) on 05/08/2024 9:24:21 AM  Radiology DG Chest Portable 1 View Result Date: 05/08/2024 CLINICAL DATA:  cough ?pna.  Fever. EXAM: PORTABLE CHEST 1 VIEW COMPARISON:  01/17/2023. FINDINGS: Bilateral lung fields are clear. Bilateral costophrenic angles are clear. Normal cardio-mediastinal silhouette. No acute osseous abnormalities. The soft tissues are within normal limits. IMPRESSION: No active disease. Electronically Signed   By: Beula Brunswick M.D.   On: 05/08/2024 09:16    Procedures Procedures    Medications Ordered in ED Medications  albuterol (VENTOLIN HFA) 108 (90 Base) MCG/ACT inhaler 2 puff (has no administration in time range)  AeroChamber Plus Flo-Vu Medium MISC 1 each (has no administration in time range)  ipratropium-albuterol (DUONEB) 0.5-2.5 (3) MG/3ML nebulizer solution 3 mL (3 mLs Nebulization Given 05/08/24 0907)  albuterol (PROVENTIL) (2.5 MG/3ML) 0.083% nebulizer solution 5 mg (5 mg Nebulization Given 05/08/24 0956)  sodium chloride  0.9 % bolus 1,000 mL (1,000 mLs Intravenous New Bag/Given 05/08/24 1048)  benzonatate  (TESSALON ) capsule 100 mg (100 mg Oral Given 05/08/24 1057)    ED Course/ Medical Decision Making/ A&P Clinical Course as of 05/08/24 1143  Mon May 08, 2024  1141 No leukocytosis or focal consolidation on chest  x-ray.  Low suspicion for community-acquired pneumonia.  Remainder of her laboratory workup notable for slight elevation in creatinine may be due to dehydration.  Gave IV fluids for rehydration.  No other acute abnormalities on blood work.  UA does not show UTI and COVID flu RSV all negative.  Troponin is negative and no ischemic changes on EKG.  Low suspicion for ACS.  Reevaluated patient and she reports improvement in her symptoms after 2 nebulizer treatments and Tessalon  Perles.  Will send her home with albuterol MDI with spacer along with prescription for Tessalon  Perles for what is most likely viral bronchitis.  She will follow-up with her PCP [MP]    Clinical  Course User Index [MP] Sallyanne Creamer, DO                                 Medical Decision Making 53 year old female with history as above presenting for 1 week of cough fevers general malaise.  Right-sided lateral chest wall and back pain made worse with coughing.  Afebrile normotensive here.  No abdominal tenderness on my exam.  Some rhonchi without rales on auscultation.  Differential diagnosis includes pneumonia, viral URI, atypical ACS, DKA/HHS, UTI.  Will evaluate with laboratory workup, EKG, chest x-ray.  Will try DuoNeb to see if that helps with the rhonchorous breath sounds.  Amount and/or Complexity of Data Reviewed Labs: ordered. Radiology: ordered.  Risk Prescription drug management.           Final Clinical Impression(s) / ED Diagnoses Final diagnoses:  Viral URI with cough    Rx / DC Orders ED Discharge Orders          Ordered    benzonatate  (TESSALON ) 100 MG capsule  Every 8 hours        05/08/24 1139              Rafael Bun A, DO 05/08/24 1143

## 2024-07-19 ENCOUNTER — Emergency Department (HOSPITAL_BASED_OUTPATIENT_CLINIC_OR_DEPARTMENT_OTHER)
Admission: EM | Admit: 2024-07-19 | Discharge: 2024-07-19 | Disposition: A | Discharge status: Home / Self Care | Payer: MEDICAID | Attending: Emergency Medicine | Admitting: Emergency Medicine

## 2024-07-19 ENCOUNTER — Other Ambulatory Visit: Payer: Self-pay

## 2024-07-19 ENCOUNTER — Emergency Department (HOSPITAL_BASED_OUTPATIENT_CLINIC_OR_DEPARTMENT_OTHER): Payer: MEDICAID

## 2024-07-19 DIAGNOSIS — S0990XA Unspecified injury of head, initial encounter: Secondary | ICD-10-CM | POA: Diagnosis present

## 2024-07-19 DIAGNOSIS — W130XXA Fall from, out of or through balcony, initial encounter: Secondary | ICD-10-CM | POA: Insufficient documentation

## 2024-07-19 DIAGNOSIS — E876 Hypokalemia: Secondary | ICD-10-CM | POA: Insufficient documentation

## 2024-07-19 DIAGNOSIS — S0093XA Contusion of unspecified part of head, initial encounter: Secondary | ICD-10-CM | POA: Insufficient documentation

## 2024-07-19 DIAGNOSIS — R569 Unspecified convulsions: Secondary | ICD-10-CM | POA: Insufficient documentation

## 2024-07-19 LAB — BASIC METABOLIC PANEL WITH GFR
Anion gap: 12 (ref 5–15)
BUN: 8 mg/dL (ref 6–20)
CO2: 26 mmol/L (ref 22–32)
Calcium: 9.5 mg/dL (ref 8.9–10.3)
Chloride: 100 mmol/L (ref 98–111)
Creatinine, Ser: 1.43 mg/dL — ABNORMAL HIGH (ref 0.44–1.00)
GFR, Estimated: 44 mL/min — ABNORMAL LOW (ref >60)
Glucose, Bld: 155 mg/dL — ABNORMAL HIGH (ref 70–99)
Potassium: 3.3 mmol/L — ABNORMAL LOW (ref 3.5–5.1)
Sodium: 138 mmol/L (ref 135–145)

## 2024-07-19 LAB — CBC WITH DIFFERENTIAL/PLATELET
Abs Immature Granulocytes: 0.02 K/uL (ref 0.00–0.07)
Basophils Absolute: 0.1 K/uL (ref 0.0–0.1)
Basophils Relative: 1 %
Eosinophils Absolute: 0 K/uL (ref 0.0–0.5)
Eosinophils Relative: 0 %
HCT: 39.4 % (ref 36.0–46.0)
Hemoglobin: 12.9 g/dL (ref 12.0–15.0)
Immature Granulocytes: 0 %
Lymphocytes Relative: 15 %
Lymphs Abs: 0.8 K/uL (ref 0.7–4.0)
MCH: 27 pg (ref 26.0–34.0)
MCHC: 32.7 g/dL (ref 30.0–36.0)
MCV: 82.6 fL (ref 80.0–100.0)
Monocytes Absolute: 0.3 K/uL (ref 0.1–1.0)
Monocytes Relative: 6 %
Neutro Abs: 4.3 K/uL (ref 1.7–7.7)
Neutrophils Relative %: 78 %
Platelets: 142 K/uL — ABNORMAL LOW (ref 150–400)
RBC: 4.77 MIL/uL (ref 3.87–5.11)
RDW: 14 % (ref 11.5–15.5)
WBC: 5.5 K/uL (ref 4.0–10.5)
nRBC: 0 % (ref 0.0–0.2)

## 2024-07-19 MED ORDER — POTASSIUM CHLORIDE CRYS ER 20 MEQ PO TBCR
40.0000 meq | EXTENDED_RELEASE_TABLET | Freq: Once | ORAL | Status: AC
  Administered 2024-07-19: 40 meq via ORAL
  Filled 2024-07-19: qty 2

## 2024-07-19 NOTE — Discharge Instructions (Addendum)
 Please continue your regular medications and follow-up with your primary care doctor.  Drink plenty of fluids.  We have also put a referral in for neurology.  They should call you to schedule an appointment.

## 2024-07-19 NOTE — ED Triage Notes (Signed)
 Pt presents with reports a seizure activity about an hour pta. Per husband, he heard her fall and when he went to her he say her seizing and foaming at the mouth. Lasted about 1 min.  Injury to head. No blood thinners  Has hx of the same on Lamictal.

## 2024-07-19 NOTE — ED Provider Notes (Signed)
 [place] [facility] Provider Note   CSN: [id number] Arrival date & time: 07/19/24  1321     Patient presents with: Seizures   [name] is a 53 y.o. female.  She has multiple medical issues including seizure and prolonged QT.  Husband states she was on the porch when she fell the ground and was seizing for about 1 minute.  Hit head.  Confused afterward.  He states she has not had a seizure in a while.  She said she has been compliant with her medicine.  Complaining of headache.  No recent fevers chills nausea or vomiting.   The history is provided by the patient and the spouse.  Seizures Seizure activity on arrival: no   Seizure type:  Unable to specify Initial focality:  Unable to specify Episode characteristics: generalized shaking   Postictal symptoms: confusion   Return to baseline: yes   Duration:  1 minute Timing:  Once Progression:  Resolved Recent head injury:  No recent head injuries PTA treatment:  None History of seizures: yes        Prior to Admission medications   Medication Sig Start Date End Date Taking? Authorizing Provider  allopurinol (ZYLOPRIM) 100 MG tablet Take 100 mg by mouth daily.    [provider]  ALPRAZolam  (XANAX ) 1 MG tablet Take 1 mg by mouth 3 (three) times daily as needed for anxiety. 02/10/21   [provider]  atorvastatin (LIPITOR) 40 MG tablet Take 40 mg at bedtime by mouth. 08/23/17   [provider]  benzonatate  (TESSALON ) 100 MG capsule Take 1 capsule (100 mg total) by mouth every 8 (eight) hours. 05/08/24   Pamella Ozell LABOR, DO  estradiol (ESTRACE) 1 MG tablet Take 1 mg by mouth daily. 11/15/20   [provider]  furosemide (LASIX) 20 MG tablet Take 20 mg by mouth daily.    [provider]  gabapentin (NEURONTIN) 300 MG capsule Take 300 mg by mouth daily. 01/14/21   [provider]  lamoTRIgine (LAMICTAL) 25 MG tablet Take 100 mg by mouth 2  (two) times daily. 06/18/21   [provider]  loperamide  (IMODIUM ) 2 MG capsule Take 2 capsules (4 mg total) by mouth 4 (four) times daily as needed for diarrhea or loose stools. 01/17/23   Redwine, Madison A, PA-C  nadolol  (CORGARD ) 20 MG tablet Take 1 tablet (20 mg total) by mouth daily. 10/20/22   Camnitz, Soyla Lunger, MD  ondansetron  (ZOFRAN -ODT) 4 MG disintegrating tablet 4mg  ODT q4 hours prn nausea/vomit 10/19/22   Floyd, Dan, DO  Potassium Chloride  ER 20 MEQ TBCR TAKE 1 TABLET BY MOUTH TWICE A DAY 04/21/21   Revankar, Jennifer SAUNDERS, MD  TRULICITY 0.75 MG/0.5ML SOPN Inject 0.75 mg every Thursday into the skin. 09/01/17   [provider]    Allergies: Bupropion , Nortriptyline , Zofran  [ondansetron  hcl], and Zoloft [sertraline hcl]    Review of Systems  Constitutional:  Negative for fever.  Respiratory:  Negative for shortness of breath.   Cardiovascular:  Negative for chest pain.  Gastrointestinal:  Negative for abdominal pain.  Genitourinary:  Negative for dysuria.  Skin:  Negative for rash.  Neurological:  Positive for seizures and headaches.    Updated Vital Signs BP (!) 112/54 (BP Location: Right Arm)   Pulse 88   Temp 97.7 F (36.5 C)   Resp 18   SpO2 93%   Physical Exam Vitals and nursing note reviewed.  Constitutional:  General: She is not in acute distress.    Appearance: Normal appearance. She is well-developed.  HENT:     Head: Normocephalic.     Comments: He has a hematoma on the back of her head with no bleeding Eyes:     Conjunctiva/sclera: Conjunctivae normal.  Cardiovascular:     Rate and Rhythm: Normal rate and regular rhythm.     Heart sounds: No murmur heard. Pulmonary:     Effort: Pulmonary effort is normal. No respiratory distress.     Breath sounds: Normal breath sounds. No stridor. No wheezing.  Abdominal:     Palpations: Abdomen is soft.     Tenderness: There is no abdominal tenderness. There is no guarding or rebound.   Musculoskeletal:        General: No tenderness or deformity.     Cervical back: Neck supple.  Skin:    General: Skin is warm and dry.  Neurological:     General: No focal deficit present.     Mental Status: She is alert.     GCS: GCS eye subscore is 4. GCS verbal subscore is 5. GCS motor subscore is 6.     Motor: No weakness.     (all labs ordered are listed, but only abnormal results are displayed) Labs Reviewed  BASIC METABOLIC PANEL WITH GFR - Abnormal; Notable for the following components:      Result Value   Potassium 3.3 (*)    Glucose, Bld 155 (*)    Creatinine, Ser 1.43 (*)    GFR, Estimated 44 (*)    All other components within normal limits  CBC WITH DIFFERENTIAL/PLATELET - Abnormal; Notable for the following components:   Platelets 142 (*)    All other components within normal limits  URINALYSIS, ROUTINE W REFLEX MICROSCOPIC    EKG: EKG Interpretation Date/Time:  Wednesday July 19 2024 13:30:33 EDT Ventricular Rate:  89 PR Interval:  150 QRS Duration:  92 QT Interval:  405 QTC Calculation: 493 R Axis:   44  Text Interpretation: Sinus rhythm Inferior infarct, old Consider anterolateral infarct No significant change since prior 5/25 Confirmed by Towana Sharper [phone]) on 07/19/2024 1:31:57 PM  Radiology: CT Head Wo Contrast Result Date: 07/19/2024 CLINICAL DATA:  Head trauma, abnormal mental status (Age 42-64y) EXAM: CT HEAD WITHOUT CONTRAST TECHNIQUE: Contiguous axial images were obtained from the base of the skull through the vertex without intravenous contrast. RADIATION DOSE REDUCTION: This exam was performed according to the departmental dose-optimization program which includes automated exposure control, adjustment of the mA and/or kV according to patient size and/or use of iterative reconstruction technique. COMPARISON:  CT head 12/20/2021 FINDINGS: Brain: No evidence of large-territorial acute infarction. No parenchymal hemorrhage. No mass lesion. No  extra-axial collection. No mass effect or midline shift. No hydrocephalus. Basilar cisterns are patent. Vascular: No hyperdense vessel. Atherosclerotic calcifications are present within the cavernous internal carotid arteries. Skull: No acute fracture or focal lesion. Sinuses/Orbits: Paranasal sinuses and mastoid air cells are clear. The orbits are unremarkable. Other: Right occipital scalp 5 mm hematoma. IMPRESSION: No acute intracranial abnormality. Electronically Signed   By: Morgane  Naveau M.D.   On: 07/19/2024 14:39     Procedures   Medications Ordered in the ED - No data to display  Clinical Course as of 07/19/24 1658  Wed Jul 19, 2024  1445 Reviewed results of workup with patient and husband.  They are comfortable plan for discharge and outpatient follow-up with PCP. [MB]    Clinical  Course User Index [MB] Towana Ozell BROCKS, MD                                 Medical Decision Making Amount and/or Complexity of Data Reviewed Labs: ordered. Radiology: ordered.  Risk Prescription drug management.   This patient complains of seizure fall head injury; this involves an extensive number of treatment Options and is a complaint that carries with it a high risk of complications and morbidity. The differential includes seizure, contusion, concussion, fracture, bleed, stroke, dehydration  I ordered, reviewed and interpreted labs, which included CBC with chronically low platelets, chemistries with elevated creatinine low platelets I ordered medication oral potassium and reviewed PMP when indicated. I ordered imaging studies which included CT head and I independently    visualized and interpreted imaging which showed no acute findings Additional history obtained from patient's husband Previous records obtained and reviewed in epic including prior PCP and neurology notes  Cardiac monitoring reviewed, sinus rhythm Social determinants considered, no significant barriers Critical  Interventions: None  After the interventions stated above, I reevaluated the patient and found patient to be feeling improved and no further seizure activity Admission and further testing considered, no indications for admission.  Will need close follow-up with treatment team.  Currently patient does not have a neurologist so we will put a referral in for her.  Return instructions discussed.      Final diagnoses:  Seizure (HCC)  Injury of head, initial encounter  Hypokalemia    ED Discharge Orders     None          Towana Ozell BROCKS, MD 07/19/24 1700

## 2024-08-21 ENCOUNTER — Telehealth: Payer: Self-pay | Admitting: Neurology

## 2024-08-21 NOTE — Telephone Encounter (Signed)
Patient called to verify appointment date and time

## 2024-08-27 NOTE — Progress Notes (Unsigned)
 [facility]  PATIENT: [name] DOB: 1971/11/18  REFERRING DOCTOR OR PCP: Norleen Fearing, MD SOURCE: Patient, notes from primary care, laboratory reports.  _________________________________   HISTORICAL  CHIEF COMPLAINT:  No chief complaint on file.   HISTORY OF PRESENT ILLNESS:  I had the pleasure of seeing your patient, [name], at [facility] for neurologic consultation regarding seizures.    She is a 53 year old woman who had her first seizure at age 3, in November 2021.  I saw her at that time and an EEG was performed which was normal.  MRI of the brain 12/08/2020 showed minimal chronic microvascular ischemic change but no acute findings.  She had been on Wellbutrin  at that time and I discontinued it.  In 2022, she was placed on lamotrigine .    More recently, she presented to the emergency room 07/19/2024 due to a second seizure.  The husband witnessed the event.  She fell to the ground and had generalized tonic-clonic activity for about 1 minute.  She was confused afterwards.     Seizure history:  She is a 53 year old woman who had a witnessed seizure last weekend..   She was at the [employer] with her husband and she was feeling completely at her baseline.   Her husband had turned around and heard a commotion and looked back.  She had fallen on the ground and had 10-15 seconds of generalized tonic clonic activity.    She was then limp for 10-30 seconds and then became conscious but was groggy.    Paramedics came and she was confused but able to answer some simple questions.  She has no memory of the event or next hour.  She had a good night's sleep the previous night.   Her sugar ws measured by EMT and was 120.    She went to [facility] and was treated and released.  By report, she had a CT scan and the husband was told it was fine.  *** She has never had definite seizures in the past.   She did have a spell a couple  months ago at [facility] without loss of consciousness when she felt dazed and was not completely responsive for a minute.   She quickly returned to her baseline and was fine the rest of the day.  She jerks a lot in her sleep and even rolled out of bed twice.  Additionally, she has restless leg syndrome symptoms when staying still in the evening and when trying to fall asleep at night  She has been on Buproprion for depression x 2-3 years and tolerated it well.    She is also on low dose nortriptlyine.     She takes low dose alprazolam  at night only for insomnia.  Zolpidem  has not been tolerated..     She has Type 2 DM and is on Trulicity.  She takes gabapentin for diabetic polyneuropathy, described as a mild burning in her feet only.  She has had more stress lately.    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     No FH of seizures.   No h/o head trauma or LOC in past.   She had a normal delivery and normal milestone achievement.      REVIEW OF SYSTEMS: Constitutional: No fevers, chills, sweats, or change in appetite.  She has restless leg syndrome. Eyes: No visual changes, double vision, eye pain Ear, nose and throat: No hearing loss, ear pain, nasal  congestion, sore throat Cardiovascular: No chest pain, palpitations Respiratory:  No shortness of breath at rest or with exertion.   No wheezes GastrointestinaI: No nausea, vomiting, diarrhea, abdominal pain, fecal incontinence Genitourinary:  No dysuria, urinary retention or frequency.  No nocturia. Musculoskeletal:  No neck pain, back pain Integumentary: No rash, pruritus, skin lesions Neurological: as above Psychiatric: No depression at this time.  No anxiety Endocrine: No palpitations, diaphoresis, change in appetite, change in weigh or increased thirst Hematologic/Lymphatic:  No anemia, purpura, petechiae. Allergic/Immunologic: No itchy/runny eyes, nasal congestion, recent allergic reactions, rashes  ALLERGIES: Allergies  Allergen  Reactions   Bupropion      Other reaction(s): Other (See Comments) seizures   Nortriptyline      Other reaction(s): Other (See Comments) seizures   Zofran  [Ondansetron  Hcl]    Zoloft [Sertraline Hcl]     HOME MEDICATIONS:  Current Outpatient Medications:    allopurinol (ZYLOPRIM) 100 MG tablet, Take 100 mg by mouth daily., Disp: , Rfl:    ALPRAZolam  (XANAX ) 1 MG tablet, Take 1 mg by mouth 3 (three) times daily as needed for anxiety., Disp: , Rfl:    atorvastatin (LIPITOR) 40 MG tablet, Take 40 mg at bedtime by mouth., Disp: , Rfl: 3   benzonatate  (TESSALON ) 100 MG capsule, Take 1 capsule (100 mg total) by mouth every 8 (eight) hours., Disp: 21 capsule, Rfl: 0   estradiol (ESTRACE) 1 MG tablet, Take 1 mg by mouth daily., Disp: , Rfl:    furosemide (LASIX) 20 MG tablet, Take 20 mg by mouth daily., Disp: , Rfl:    gabapentin (NEURONTIN) 300 MG capsule, Take 300 mg by mouth daily., Disp: , Rfl:    lamoTRIgine  (LAMICTAL ) 25 MG tablet, Take 100 mg by mouth 2 (two) times daily., Disp: , Rfl:    loperamide  (IMODIUM ) 2 MG capsule, Take 2 capsules (4 mg total) by mouth 4 (four) times daily as needed for diarrhea or loose stools., Disp: 12 capsule, Rfl: 0   nadolol  (CORGARD ) 20 MG tablet, Take 1 tablet (20 mg total) by mouth daily., Disp: 15 tablet, Rfl: 0   ondansetron  (ZOFRAN -ODT) 4 MG disintegrating tablet, 4mg  ODT q4 hours prn nausea/vomit, Disp: 20 tablet, Rfl: 0   Potassium Chloride  ER 20 MEQ TBCR, TAKE 1 TABLET BY MOUTH TWICE A DAY, Disp: 180 tablet, Rfl: 2   TRULICITY 0.75 MG/0.5ML SOPN, Inject 0.75 mg every Thursday into the skin., Disp: , Rfl: 0  PAST MEDICAL HISTORY: Past Medical History:  Diagnosis Date   Abdominal pain 09/06/2012   Abnormal mammogram of left breast 07/02/2016   Formatting of this note might be different from the original. Left posterior upper-outer quadrant 1.5cm asymmetry with decreased attenuation and conspicuity. BIRAD 3. 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 Reports biopsy with NAFLD   CKD (chronic kidney disease)    Cold sore 12/16/2011   Depression 08/19/2011   Depression with anxiety    Diabetes mellitus (HCC) 06/01/2011   Diabetic polyneuropathy associated with  type 2 diabetes mellitus (HCC) 11/14/2020   Dizziness    Edema of both lower legs due to peripheral venous insufficiency    Eyelid eczema 01/06/2012   Fatigue    loss of sleep   Gastric bypass status for obesity 02/28/2014   Gastroesophageal reflux disease without esophagitis 01/27/2015   Generalized headaches    H/O type 2 diabetes mellitus 08/03/2013   Herpes simplex 12/16/2011   Formatting of this note might be different from the original. Last Assessment & Plan:  New.  Start Valtrex  to improve sxs.   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 S/p  lithotripsy and ureteroscopy. 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   Recurrent kidney stones 05/09/2013   Recurrent UTI    Restless leg 11/14/2020   S/P gastric bypass 06/01/2011   Seizure disorder (HCC) 11/14/2020   Slurred speech 05/10/2021   Small bowel intussusception (HCC) 01/17/2014   Stage 3 chronic kidney disease (HCC) 03/17/2021   Superficial postoperative wound infection 08/11/2013   Syncope 05/09/2013   Tension headache 05/09/2013   Thrombophlebitis    Type 2 diabetes mellitus with hyperlipidemia (HCC)    Type 2 diabetes mellitus without complications (HCC) 06/01/2011   Vitamin D  deficiency    Wears glasses    Weight decrease     PAST SURGICAL HISTORY: Past Surgical History:  Procedure Laterality Date   ABDOMINAL HYSTERECTOMY  2008   for polycystic ovaries   APPENDECTOMY     BILATERAL SALPINGOOPHORECTOMY  2012   Dr Okey   CHOLECYSTECTOMY  2005   COLONOSCOPY     negative, done for pain   CYSTOSTOMY W/ BLADDER BIOPSY     EXPLORATORY LAPAROTOMY  09/01/11   GASTRIC BYPASS  10/2010   kidney stones     LAPAROSCOPY  10/19/2011   Procedure: LAPAROSCOPY OPERATIVE;  Surgeon: Peggye Okey;  Location: [facility];  Service: Gynecology;  Laterality: N/A;  Operative Laparoscopy with Lysis Of Adhesions   LAPAROTOMY  10/19/2011   Procedure: LAPAROTOMY;  Surgeon: Peggye Okey;  Location: [facility];  Service: Gynecology;  Laterality: Bilateral;  Laparotomy With Bilateral Oophorectomy   TUBAL LIGATION     x3   UPPER GASTROINTESTINAL ENDOSCOPY     to assess varices    FAMILY HISTORY: Family  History  Problem Relation Age of Onset   Lung cancer Mother    Cancer Mother        lung   Hyperlipidemia Father    Heart disease Father    Stroke Father  Hypertension Father    Diabetes Father    Crohn's disease Father    Colon cancer Father    Hypertension Brother    Other Brother        suicide    SOCIAL HISTORY:  Social History   Socioeconomic History   Marital status: Married    Spouse name: Not on file   Number of children: 4   Years of education: GED/community college   Highest education level: Not on file  Occupational History   Occupation: [employer]  Tobacco Use   Smoking status: Never   Smokeless tobacco: Never  Vaping Use   Vaping status: Never Used  Substance and Sexual Activity   Alcohol use: Never    Comment: never   Drug use: No   Sexual activity: Yes    Birth control/protection: Surgical, Implant  Other Topics Concern   Not on file  Social History Narrative   Lives   Caffeine use:    Right handed    Social Drivers of Health   Financial Resource Strain: Low Risk  (05/11/2021)   Received from [facility]   Overall Financial Resource Strain (CARDIA)    Difficulty of Paying Living Expenses: Not hard at all  Food Insecurity: Not on file  Transportation Needs: Not on file  Physical Activity: Not on file  Stress: No Stress Concern Present (05/11/2021)   Received from [facility] - Occupational Stress Questionnaire    Feeling of Stress : Not at all  Social Connections: Unknown (05/05/2022)   Received from [facility]   Social Network    Social Network: Not on file  Intimate Partner Violence: Unknown (03/31/2022)   Received from [facility]   HITS    Physically Hurt: Not on file    Insult or Talk Down To: Not on file    Threaten Physical Harm: Not on file    Scream or Curse: Not on file     PHYSICAL EXAM  There were no vitals filed for this visit.   There is no height or weight on file  to calculate BMI.   General: The patient is well-developed and well-nourished and in no acute distress  HEENT:  Head is [place]/AT.  Sclera are anicteric.     Neck: No carotid bruits are noted.  The neck is nontender.  Cardiovascular: The heart has a regular rate and rhythm with a normal S1 and S2. There were no murmurs, gallops or rubs.    Skin: Extremities are without rash or  edema.  Musculoskeletal:  Back is nontender  Neurologic Exam  Mental status: The patient is alert and oriented x 3 at the time of the examination. The patient has apparent normal recent and remote memory, with an apparently normal attention span and concentration ability.   Speech is normal.  Cranial nerves: Extraocular movements are full. Pupils are equal, round, and reactive to light and accomodation.  There is good facial sensation to soft touch bilaterally.Facial strength is normal.  Trapezius and sternocleidomastoid strength is normal. No dysarthria is noted.   No obvious hearing deficits are noted.  Motor:  Muscle bulk is normal.   Tone is normal. Strength is  5 / 5 in all 4 extremities.   Sensory: Sensory testing is intact to pinprick, soft touch and vibration sensation in arms and reduced vibration and pinprick sensation in toes but normal at ankles.     Coordination: Cerebellar testing reveals good finger-nose-finger and heel-to-shin bilaterally.  Gait and station: Station is normal.   Gait is normal. Tandem gait is normal. Romberg is negative.   Reflexes: Deep tendon reflexes are symmetric and normal bilaterally.   Plantar responses are flexor.    DIAGNOSTIC DATA (LABS, IMAGING, TESTING) - I reviewed patient records, labs, notes, testing and imaging myself where available.  Lab Results  Component Value Date   WBC 5.5 07/19/2024   HGB 12.9 07/19/2024   HCT 39.4 07/19/2024   MCV 82.6 07/19/2024   PLT 142 (L) 07/19/2024      Component Value Date/Time   NA 138 07/19/2024 1342   NA 138  02/28/2021 0916   K 3.3 (L) 07/19/2024 1342   CL 100 07/19/2024 1342   CO2 26 07/19/2024 1342   GLUCOSE 155 (H) 07/19/2024 1342   BUN 8 07/19/2024 1342   BUN 13 02/28/2021 0916   CREATININE 1.43 (H) 07/19/2024 1342   CREATININE 1.50 (H) 01/06/2012 1612   CALCIUM  9.5 07/19/2024 1342   PROT 6.6 05/08/2024 0920   PROT 6.8 11/14/2020 1549   ALBUMIN 4.1 05/08/2024 0920   ALBUMIN 4.5 11/14/2020 1549   AST 34 05/08/2024 0920   ALT 27 05/08/2024 0920   ALKPHOS 83 05/08/2024 0920   BILITOT 0.4 05/08/2024 0920   BILITOT 0.6 11/14/2020 1549   GFRNONAA 44 (L) 07/19/2024 1342   GFRAA 60 02/18/2021 1039   Lab Results  Component Value Date   CHOL 204 (H) 09/22/2012   HDL 57.30 09/22/2012   LDLCALC 68 12/16/2011   LDLDIRECT 135.0 09/22/2012   TRIG 80.0 09/22/2012   CHOLHDL 4 09/22/2012   Lab Results  Component Value Date   HGBA1C 5.3 11/14/2017   Lab Results  Component Value Date   VITAMINB12 125 (L) 11/15/2017   Lab Results  Component Value Date   TSH 1.82 06/14/2013       ASSESSMENT AND PLAN  No diagnosis found.   In summary, Ms. Luttrull is a 53 year old woman who had the first seizure of her life a couple days ago that was unprovoked.  Of note, she is taking bupropion  and we discussed that that can lower the seizure threshold.  Therefore, I will have her stop that medication.  She will continue on fluoxetine for depression.  Because the seizure was unprovoked we need to check an EEG to determine if there is any epileptic focus and also check an MRI of the brain (I will do without contrast as she has mild renal insufficiency) to make sure that there is not an abnormality that may reduce her seizure threshold.  Additionally we will check electrolytes including magnesium and have her supplement if needed.  Since she has known liver disease, I will also check an ammonia level.  A second problem is restless leg/periodic limb movements of sleep.  The gabapentin that she is on could  help some and sometimes nortriptyline  can make this issue worse.  I will also check her ferritin level as low iron can make restless leg syndrome worse.  Consider adding ropinirole if ferritin is fine or supplementing her if ferritin is low.  She will return to see me as needed based on the results of the tests and call if she has another seizure or other neurologic issue.  Thank you for asking me to see Ms. Lovins.  Please let me know if I can be of further assistance with her or other patients in the future.    Trebor Galdamez A. Vear, MD, [facility] 08/27/2024, 8:11 PM Certified in  Neurology, Clinical Neurophysiology, Sleep Medicine and Neuroimaging  [facility] [address], [address] [place], [place] [address] [phone]

## 2024-08-29 ENCOUNTER — Encounter: Payer: Self-pay | Admitting: Neurology

## 2024-08-29 ENCOUNTER — Ambulatory Visit (INDEPENDENT_AMBULATORY_CARE_PROVIDER_SITE_OTHER): Payer: MEDICAID | Admitting: Neurology

## 2024-08-29 VITALS — BP 110/70 | HR 78 | Ht 64.0 in | Wt 200.2 lb

## 2024-08-29 DIAGNOSIS — G40909 Epilepsy, unspecified, not intractable, without status epilepticus: Secondary | ICD-10-CM

## 2024-08-29 DIAGNOSIS — R269 Unspecified abnormalities of gait and mobility: Secondary | ICD-10-CM

## 2024-08-29 DIAGNOSIS — G4752 REM sleep behavior disorder: Secondary | ICD-10-CM

## 2024-08-29 DIAGNOSIS — G4719 Other hypersomnia: Secondary | ICD-10-CM

## 2024-08-29 DIAGNOSIS — R4189 Other symptoms and signs involving cognitive functions and awareness: Secondary | ICD-10-CM

## 2024-08-29 DIAGNOSIS — R0683 Snoring: Secondary | ICD-10-CM

## 2024-08-29 MED ORDER — LAMOTRIGINE 200 MG PO TABS: 100.0000 mg | ORAL_TABLET | Freq: Two times a day (BID) | ORAL | 3 refills | Status: DC

## 2024-08-29 MED ORDER — LAMOTRIGINE 200 MG PO TABS: 200.0000 mg | ORAL_TABLET | Freq: Two times a day (BID) | ORAL | 3 refills | Status: AC

## 2024-08-30 ENCOUNTER — Ambulatory Visit: Payer: Self-pay | Admitting: Neurology

## 2024-08-30 LAB — TSH: TSH: 1.76 u[IU]/mL (ref 0.450–4.500)

## 2024-08-30 LAB — VITAMIN B12: Vitamin B-12: 383 pg/mL (ref 232–1245)

## 2024-09-12 ENCOUNTER — Telehealth: Payer: Self-pay | Admitting: Neurology

## 2024-09-12 NOTE — Telephone Encounter (Signed)
 trillium shara: [id number] exp. 08/30/24-10/29/24 sent to GI [phone]

## 2024-09-26 ENCOUNTER — Ambulatory Visit (INDEPENDENT_AMBULATORY_CARE_PROVIDER_SITE_OTHER): Payer: MEDICAID | Admitting: Neurology

## 2024-09-26 DIAGNOSIS — G4733 Obstructive sleep apnea (adult) (pediatric): Secondary | ICD-10-CM | POA: Diagnosis not present

## 2024-09-26 DIAGNOSIS — R0683 Snoring: Secondary | ICD-10-CM

## 2024-09-26 DIAGNOSIS — G4734 Idiopathic sleep related nonobstructive alveolar hypoventilation: Secondary | ICD-10-CM

## 2024-09-26 DIAGNOSIS — G4719 Other hypersomnia: Secondary | ICD-10-CM

## 2024-09-28 NOTE — Progress Notes (Signed)
      [facility]  HOME SLEEP STUDY  STUDY DATE: 09/26/2024 PATIENT NAME: [name] DOB: 07/22/1971 MRN: [id number]  ORDERING CLINICIAN: Richard A. Sater, MD, PhD  CLINICAL INFORMATION: 53 year old woman with snoring and excessive daytime sleepiness  IMPRESSION:  Mild obstructive sleep apnea with pAHI 3% = 5.9/h.  OSA was moderate during REM sleep with REM AHI = 15.5/h Significant nocturnal hypoxemia with 65.8% of the night below an SaO2 of 88% and 1% of the night below 85% 10 hours and 29 minutes of sleep was recorded corresponding to sleep efficiency of 98%.  REM sleep was recorded.   RECOMMENDATION: The mild obstructive sleep apnea might best be treated with weight loss though CPAP (AutoPap 5-15 cm H2O with heated humidifier) could be considered since the patient has excessive daytime sleepiness.  Alternatively, an oral appliance could also be considered. The patient has significant nocturnal hypoxemia.  Recommend oxygen supplementation at night (2 L/min) Follow-up with Dr. Vear   INTERPRETING PHYSICIAN:   Charlie LABOR. Vear, MD, PhD, [facility] Certified in Neurology, Clinical Neurophysiology, Sleep Medicine, Pain Medicine and Neuroimaging  [facility] [address] [place], [place] [address] [phone]

## 2024-10-02 ENCOUNTER — Telehealth: Payer: Self-pay | Admitting: *Deleted

## 2024-10-02 DIAGNOSIS — G4734 Idiopathic sleep related nonobstructive alveolar hypoventilation: Secondary | ICD-10-CM

## 2024-10-02 DIAGNOSIS — G4733 Obstructive sleep apnea (adult) (pediatric): Secondary | ICD-10-CM

## 2024-10-02 NOTE — Telephone Encounter (Signed)
 Called pt at [phone]. Relayed results per Dr. Duncan note. Pt verbalized understanding. Placed orders per Dr. Vear request and sent community message to [facility] that orders placed.  Provided pt their phone# to have on hand if needed. Aware they will call her in next week or so.   Scheduled initial CPAP f/u for 01/01/25 at 2:00pm with Dr. Vear.

## 2024-10-02 NOTE — Telephone Encounter (Signed)
-----   Message from Charlie DELENA Crete sent at 10/02/2024  9:07 AM EDT ----- Regarding: HST Home sleep study showed mild obstructive sleep apnea (moderate during REM sleep) but severe nocturnal hypoxemia.  She needs to start AutoPap 5-15 cm H2O pressure.  She will need an overnight oximetry on CPAP to determine if she also needs supplemental oxygen.  The same DME company that does the CPAP should also be able to let us  know the results so we can see if we need to order supplemental O2

## 2024-10-07 ENCOUNTER — Ambulatory Visit
Admission: RE | Admit: 2024-10-07 | Discharge: 2024-10-07 | Disposition: A | Discharge status: Home / Self Care | Payer: MEDICAID | Source: Ambulatory Visit | Attending: Neurology | Admitting: Neurology

## 2024-10-07 DIAGNOSIS — G40909 Epilepsy, unspecified, not intractable, without status epilepticus: Secondary | ICD-10-CM

## 2024-10-07 DIAGNOSIS — R4189 Other symptoms and signs involving cognitive functions and awareness: Secondary | ICD-10-CM

## 2024-10-07 DIAGNOSIS — R269 Unspecified abnormalities of gait and mobility: Secondary | ICD-10-CM

## 2024-10-07 MED ORDER — GADOPICLENOL 0.5 MMOL/ML IV SOLN
9.0000 mL | Freq: Once | INTRAVENOUS | Status: AC | PRN
  Administered 2024-10-07: 9 mL via INTRAVENOUS

## 2024-11-29 ENCOUNTER — Emergency Department (HOSPITAL_BASED_OUTPATIENT_CLINIC_OR_DEPARTMENT_OTHER)
Admission: EM | Admit: 2024-11-29 | Discharge: 2024-11-29 | Disposition: A | Discharge status: Home / Self Care | Payer: MEDICAID | Attending: Emergency Medicine | Admitting: Emergency Medicine

## 2024-11-29 ENCOUNTER — Emergency Department (HOSPITAL_BASED_OUTPATIENT_CLINIC_OR_DEPARTMENT_OTHER): Payer: MEDICAID

## 2024-11-29 ENCOUNTER — Encounter (HOSPITAL_BASED_OUTPATIENT_CLINIC_OR_DEPARTMENT_OTHER): Payer: Self-pay

## 2024-11-29 ENCOUNTER — Ambulatory Visit: Payer: MEDICAID | Admitting: Neurology

## 2024-11-29 ENCOUNTER — Other Ambulatory Visit: Payer: Self-pay

## 2024-11-29 DIAGNOSIS — R002 Palpitations: Secondary | ICD-10-CM | POA: Diagnosis present

## 2024-11-29 LAB — URINALYSIS, MICROSCOPIC (REFLEX): RBC / HPF: >50 RBC/hpf (ref 0–5)

## 2024-11-29 LAB — BASIC METABOLIC PANEL WITH GFR
Anion gap: 14 (ref 5–15)
BUN: 19 mg/dL (ref 6–20)
CO2: 27 mmol/L (ref 22–32)
Calcium: 9.7 mg/dL (ref 8.9–10.3)
Chloride: 100 mmol/L (ref 98–111)
Creatinine, Ser: 1.57 mg/dL — ABNORMAL HIGH (ref 0.44–1.00)
GFR, Estimated: 39 mL/min — ABNORMAL LOW (ref >60)
Glucose, Bld: 115 mg/dL — ABNORMAL HIGH (ref 70–99)
Potassium: 4.2 mmol/L (ref 3.5–5.1)
Sodium: 141 mmol/L (ref 135–145)

## 2024-11-29 LAB — URINALYSIS, ROUTINE W REFLEX MICROSCOPIC
Bilirubin Urine: NEGATIVE
Glucose, UA: NEGATIVE mg/dL
Ketones, ur: NEGATIVE mg/dL
Leukocytes,Ua: NEGATIVE
Nitrite: NEGATIVE
Protein, ur: NEGATIVE mg/dL
Specific Gravity, Urine: 1.015 (ref 1.005–1.030)
pH: 6 (ref 5.0–8.0)

## 2024-11-29 LAB — URINE DRUG SCREEN
Amphetamines: NEGATIVE
Barbiturates: NEGATIVE
Benzodiazepines: POSITIVE — AB
Cocaine: NEGATIVE
Fentanyl: NEGATIVE
Methadone Scn, Ur: NEGATIVE
Opiates: NEGATIVE
Tetrahydrocannabinol: NEGATIVE

## 2024-11-29 LAB — CBC
HCT: 38.2 % (ref 36.0–46.0)
Hemoglobin: 12.7 g/dL (ref 12.0–15.0)
MCH: 28 pg (ref 26.0–34.0)
MCHC: 33.2 g/dL (ref 30.0–36.0)
MCV: 84.3 fL (ref 80.0–100.0)
Platelets: 156 K/uL (ref 150–400)
RBC: 4.53 MIL/uL (ref 3.87–5.11)
RDW: 14.6 % (ref 11.5–15.5)
WBC: 5 K/uL (ref 4.0–10.5)
nRBC: 0 % (ref 0.0–0.2)

## 2024-11-29 LAB — TROPONIN T, HIGH SENSITIVITY: Troponin T High Sensitivity: <15 ng/L (ref 0–19)

## 2024-11-29 NOTE — Discharge Instructions (Addendum)
 Your laboratory results are within normal limits today.   If you experience any worsening symptoms please return to the ED.

## 2024-11-29 NOTE — ED Provider Notes (Signed)
 [facility] Provider Note   CSN: [id number] Arrival date & time: 11/29/24  1105     Patient presents with: Palpitations   [name] is a 53 y.o. female.   53 y.o female with a PMH of upper lipidemia, liver disease, blurry vision, trauma for Vitas presents to the ED with a chief complaint of numbness from head to toe which began yesterday.  She reports she was watching TV when suddenly she felt a ring around her chest, reports that she could not breathe during this episode she thought it was felt like a panic attack , however this reoccurred this morning once again.  She reports no improvement in her symptoms however they have been intermittent.  She is currently on Ozempic, does have underlying history of seizures and is currently on lamotrigine  but this did not feel like a seizure.  She is also endorsing pain in her head however is not having any neck pain.  She reports the palpitations lasted for about a day, no alleviating or exacerbating factors.  No shortness of breath, no leg swelling or fevers.   The history is provided by the patient.  Palpitations Onset quality:  Sudden Duration:  1 day Associated symptoms: chest pain   Associated symptoms: no shortness of breath        Prior to Admission medications   Medication Sig Start Date End Date Taking? Authorizing Provider  alprazolam  (XANAX ) 2 MG tablet Take 1-2 mg by mouth 3 (three) times daily as needed. 11/16/24  Yes [provider]  FLUoxetine (PROZAC) 20 MG capsule Take 20 mg by mouth daily. 11/21/24  Yes [provider]  FLUoxetine (PROZAC) 40 MG capsule Take 40 mg by mouth daily. 11/21/24  Yes [provider]  lamoTRIgine  (LAMICTAL ) 150 MG tablet Take 150 mg by mouth 2 (two) times daily. 09/04/24  Yes [provider]  methocarbamol (ROBAXIN) 750 MG tablet Take 750 mg by mouth every 8 (eight) hours as needed. 11/16/24  Yes [provider]  promethazine  (PHENERGAN ) 25 MG tablet Take 25 mg by mouth every 8 (eight) hours as needed. 11/13/24  Yes [provider]  allopurinol (ZYLOPRIM) 100 MG tablet Take 100 mg by mouth daily.    [provider]  ALPRAZolam  (XANAX ) 1 MG tablet Take 1 mg by mouth 3 (three) times daily as needed for anxiety. 02/10/21   [provider]  atorvastatin (LIPITOR) 40 MG tablet Take 40 mg at bedtime by mouth. 08/23/17   [provider]  benzonatate  (TESSALON ) 100 MG capsule Take 1 capsule (100 mg total) by mouth every 8 (eight) hours. Patient taking differently: Take 100 mg by mouth as needed. 05/08/24   Pamella Ozell LABOR, DO  estradiol (ESTRACE) 1 MG tablet Take 1 mg by mouth daily. 11/15/20   [provider]  furosemide (LASIX) 20 MG tablet Take 20 mg by mouth daily.    [provider]  gabapentin (NEURONTIN) 300 MG capsule Take 300 mg by mouth daily. 01/14/21   [provider]  lamoTRIgine  (LAMICTAL ) 200 MG tablet Take 1 tablet (200 mg total) by mouth 2 (two) times daily. 08/29/24   Sater, Charlie LABOR, MD  OZEMPIC, 1 MG/DOSE, 4 MG/3ML SOPN Inject 1 mg into the skin once a week. 06/28/24   [provider]    Allergies: Bupropion , Nortriptyline , Zofran  [ondansetron  hcl], and Zoloft [sertraline hcl]    Review of Systems  Constitutional:  Negative for chills and fever.  HENT:  Negative for sore throat.   Respiratory:  Negative for shortness of breath.   Cardiovascular:  Positive for chest pain and palpitations. Negative for leg swelling.  Gastrointestinal:  Negative for abdominal pain.  All other systems reviewed and are negative.   Updated Vital Signs BP 111/81 (BP Location: Left Arm)   Pulse 80   Temp 97.6 F (36.4 C) (Oral)   Resp 16   SpO2 94%   Physical Exam Vitals and nursing note reviewed.  Constitutional:      General: She is not in acute distress.    Appearance: She is well-developed.  HENT:     Head:  Normocephalic and atraumatic.     Mouth/Throat:     Pharynx: No oropharyngeal exudate.  Eyes:     Pupils: Pupils are equal, round, and reactive to light.  Cardiovascular:     Rate and Rhythm: Regular rhythm.     Heart sounds: Normal heart sounds.  Pulmonary:     Effort: Pulmonary effort is normal. No respiratory distress.     Breath sounds: Normal breath sounds.  Abdominal:     General: Bowel sounds are normal. There is no distension.     Palpations: Abdomen is soft.     Tenderness: There is no abdominal tenderness.  Musculoskeletal:        General: No tenderness or deformity.     Cervical back: Normal range of motion.     Right lower leg: No edema.     Left lower leg: No edema.  Skin:    General: Skin is warm and dry.  Neurological:     Mental Status: She is alert and oriented to person, place, and time.     (all labs ordered are listed, but only abnormal results are displayed) Labs Reviewed  BASIC METABOLIC PANEL WITH GFR - Abnormal; Notable for the following components:      Result Value   Glucose, Bld 115 (*)    Creatinine, Ser 1.57 (*)    GFR, Estimated 39 (*)    All other components within normal limits  URINE DRUG SCREEN - Abnormal; Notable for the following components:   Benzodiazepines POSITIVE (*)    All other components within normal limits  CBC  URINALYSIS, ROUTINE W REFLEX MICROSCOPIC  TROPONIN T, HIGH SENSITIVITY    EKG: EKG Interpretation Date/Time:  Wednesday November 29 2024 11:11:49 EST Ventricular Rate:  87 PR Interval:  143 QRS Duration:  87 QT Interval:  386 QTC Calculation: 465 R Axis:   59  Text Interpretation: Sinus rhythm Confirmed by Pamella Sharper [phone]) on 11/29/2024 2:11:10 PM  Radiology: DG Chest 2 View Result Date: 11/29/2024 CLINICAL DATA:  Palpitations, chest pain, shortness of breath EXAM: CHEST - 2 VIEW COMPARISON:  05/08/2024 FINDINGS: The heart size and mediastinal contours are within normal limits. Both lungs are clear.  The visualized skeletal structures are unremarkable. IMPRESSION: No active cardiopulmonary disease. Electronically Signed   By: CHRISTELLA.  Shick M.D.   On: 11/29/2024 12:02     Procedures   Medications Ordered in the ED - No data to display                                  Medical Decision Making Amount and/or Complexity of Data Reviewed Labs: ordered. Radiology: ordered.   This patient presents to the ED for concern of palpitations, this involves a number of treatment options, and is a complaint  that carries with it a high risk of complications and morbidity.  The differential diagnosis includes arrhythmia, ACS, infection.    Co morbidities: Discussed in HPI   Brief History:  See HPI.   EMR reviewed including pt PMHx, past surgical history and past visits to ER.   See HPI for more details   Lab Tests:  I ordered and independently interpreted labs.  The pertinent results include:    CBC with no leukocytosis, hemoglobin is within normal limits.  BMP with no electrolyte derangement.  Creatinine levels at her baseline.  First troponin is negative, she does not have any chest pain on today's visit did not obtain a delta.  UDS is positive for benzos which she is previously prescribed according to review of records.   Imaging Studies:  NAD. I personally reviewed all imaging studies and no acute abnormality found. I agree with radiology interpretation.  Cardiac Monitoring:  The patient was maintained on a cardiac monitor.  I personally viewed and interpreted the cardiac monitored which showed an underlying rhythm of: Normal sinus rhythm EKG non-ischemic   Medicines ordered:  N/A  Reevaluation:  After the interventions noted above I re-evaluated patient and found that they have :improved  Social Determinants of Health:  The patient's social determinants of health were a factor in the care of this patient  Problem List / ED Course:  Patient presented to the ED with a  chief complaint of palpitations that have been ongoing for the last day.  Patient is watching TV when suddenly she began to feel tingling and numbness from head to toe.  She reports the symptoms have now subsided.  She is concerned she did have a prior history of seizures.  However, this felt like a panic attack , however a began again today.  She reports that she has been under a lot of stress with the grandchildren at the house.  On evaluation does not complaining of any chest pain, no shortness of breath.  She does have some low O2 saturations but doubts where a CPAP at home for underlying sleep apnea and has been dozing off here.  She is normotensive, afebrile and without any tachycardia or tachypnea to suspect pulmonary embolism. BMP without any electrolyte derangement, creatinine levels at her baseline.  CBC with no leukocytosis, hemoglobin is within normal limits no signs of underlying anemia.  UDS with positive benzos which she is previously prescribed.  Troponin x 1 are flat, her EKG is normal sinus rhythm, I do not suspect ACS at this time without any chest pain or shortness of breath. Chest x-ray without any pleural effusion, no signs of pneumonia, I do not see any acute finding at this time after review. Discussed the results with patient, she tells me that she has not been using her CPAP machine, there is some component of fatigue as well.  She does not have any upper respiratory symptoms to suggest upper respiratory infection.  We discussed close follow-up with PCP, no further emergent workup needed at this time.  Patient stable for discharge.  Dispostion:  After consideration of the diagnostic results and the patients response to treatment, I feel that the patent would benefit from close follow up with PCP.     Portions of this note were generated with Scientist, clinical (histocompatibility and immunogenetics). Dictation errors may occur despite best attempts at proofreading.   Final diagnoses:  Palpitations    ED  Discharge Orders     None  Brenetta Penny, PA-C 11/29/24 1510    Pamella Ozell LABOR, DO 11/30/24 1546

## 2024-11-29 NOTE — ED Triage Notes (Signed)
 Reports palpitations, SHOB, tingling all over body for 3 days.   Slurred speech noted--states this is baseline from dentures.  Denies weakness, headache, dizziness

## 2024-11-29 NOTE — ED Notes (Signed)

## 2024-11-29 NOTE — ED Notes (Addendum)
 Pt advised she was placed on nighttime CPAP 2x weeks ago. Been struggling to get 4 hours of usage per night, and has not been sleeping well due to discomfort and pressure issues. Pt only has a nasal mask, and the pressure escapes orally. Cannot tolerate. Informed her to call her specialist and have them try out a new mask, as well as consider adjusting pressures.   Pt c/o several weaks of fatigue as well as chest tightness and SOB. Pt endorses bloating as well. Pt presents as fatigued, but ambulatory without assist to obtain UA.

## 2024-12-27 NOTE — Progress Notes (Signed)
 [name]

## 2025-01-01 ENCOUNTER — Encounter: Payer: Self-pay | Admitting: Neurology

## 2025-01-01 ENCOUNTER — Ambulatory Visit: Payer: MEDICAID | Admitting: Neurology

## 2025-01-01 VITALS — BP 108/75 | HR 74 | Resp 16 | Ht 64.0 in | Wt 198.5 lb

## 2025-01-01 DIAGNOSIS — G4733 Obstructive sleep apnea (adult) (pediatric): Secondary | ICD-10-CM

## 2025-01-01 DIAGNOSIS — Z7985 Long-term (current) use of injectable non-insulin antidiabetic drugs: Secondary | ICD-10-CM | POA: Diagnosis not present

## 2025-01-01 DIAGNOSIS — G40909 Epilepsy, unspecified, not intractable, without status epilepticus: Secondary | ICD-10-CM | POA: Diagnosis not present

## 2025-01-01 DIAGNOSIS — R4189 Other symptoms and signs involving cognitive functions and awareness: Secondary | ICD-10-CM | POA: Diagnosis not present

## 2025-01-01 DIAGNOSIS — R269 Unspecified abnormalities of gait and mobility: Secondary | ICD-10-CM

## 2025-01-01 DIAGNOSIS — E1142 Type 2 diabetes mellitus with diabetic polyneuropathy: Secondary | ICD-10-CM | POA: Diagnosis not present

## 2025-01-01 DIAGNOSIS — G4734 Idiopathic sleep related nonobstructive alveolar hypoventilation: Secondary | ICD-10-CM | POA: Diagnosis not present

## 2025-01-01 NOTE — Progress Notes (Signed)
 "  [facility]  PATIENT: [name] DOB: [birth year]  REFERRING DOCTOR OR PCP: Norleen Fearing, MD SOURCE: Patient, notes from primary care, laboratory reports.  _________________________________   HISTORICAL  CHIEF COMPLAINT:  Chief Complaint  Patient presents with   Follow-up    RM11, HUSBAND PRESENT, OFFICE VISIT 30 - Initial CPAP f/u    HISTORY OF PRESENT ILLNESS:  [name] is a 54 y.o. woman with seizures and OSA  UPDATE 01/01/2025 She denies any additional seizures since last visit.  HST 09/26/2024 showed mild OSA (AHI=5/9% but REM AHI was 15.5/h) and bad significant nocturnal hypoxemia (65% <SaO2 88%)   She started AutoPAP.   D/L today shows 4 hour compliance of only 17%.  She is using [facility].  She has only tried one type of mask (Dreamwear?).   Has not tried nasal prongs or nasal facemask.     She has mild stable reduced short-term memory and sometimes has difficulty coming up with the word.   She has rare active dreams.  No visual hallucinations..  She sometimes has trouble differentiating dreams from realty.      She sleeps a lot during the day.   She lost her job due to cognitive issues at work.    She often taks benadryl  at night.    She also reports a ringing sensation in her ears and numbness in the face.   She has headaches.   EPWORTH SLEEPINESS SCALE  On a scale of 0 - 3 what is the chance of dozing:  Sitting and Reading:   3 Watching TV:    1 Sitting inactive in a public place: 0 Passenger in car for one hour: 3 Lying down to rest in the afternoon: 2 Sitting and talking to someone: 0 Sitting quietly after lunch:  3 In a car, stopped in traffic:  0  Total (out of 24):   12/24   mild EDS  She does feel more refreshed during the day when she uses CPAP more hours.  No seizures since the last visit.  Se is on lamotrigine  100 mg (1/2 of 200 mg) twice daily.  She has Type 2 DM and is on Trulicity.  She takes gabapentin for diabetic  polyneuropathy, described as a mild burning in her feet only.  She has had more stress lately.    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     History of Seizures She had her first seizure at age 31, in November 2021.  I saw her at that time and an EEG was performed which was normal.  MRI of the brain 12/08/2020 showed minimal chronic microvascular ischemic change but no acute findings.  She had been on Wellbutrin  at that time and I discontinued it.  In 2022 she had a second seizure and started was placed on lamotrigine .      More recently, she presented to the emergency room 07/19/2024 due to an additional seizure.   She was on lamotrigine  150 mg po bid at the time.    Her husband witnessed the event.  She fell to the ground and had generalized tonic-clonic activity for about 1 minute.  She was confused afterwards.   She had a large knot on the back of her head.   This CT scan does not show any acute findings.  However, I do note that there is asymmetry of the sulcal pattern of the frontal lobes.  Going back, this can also be seen on images from 2024 and  2021 without definite change  She did not have a fever or illness.   However, she did have sleep deprivation.   She has many nights with just 4-5 hours of sleep. She is having more difficulty with her gait.  She has had a couple falls backwards.  She hit her head with one fall      No FH of seizures.   Imaging: MRI of the brain 12/08/2020 showed a couple punctate T2/FLAIR hyperintense foci consistent with very minimal chronic microvascular ischemic change.  There was no generalized cortical atrophy though the sulci were asymmetric, larger on the left.  CT scan of the head 07/19/2024 showing no acute findings.  Unchanged asymmetry of the frontal lobes with sulci larger on the left than the right   REVIEW OF SYSTEMS: Constitutional: No fevers, chills, sweats, or change in appetite.  She has restless leg syndrome. Eyes: No visual changes, double  vision, eye pain Ear, nose and throat: No hearing loss, ear pain, nasal congestion, sore throat Cardiovascular: No chest pain, palpitations Respiratory:  No shortness of breath at rest or with exertion.   No wheezes GastrointestinaI: No nausea, vomiting, diarrhea, abdominal pain, fecal incontinence Genitourinary:  No dysuria, urinary retention or frequency.  No nocturia. Musculoskeletal:  No neck pain, back pain Integumentary: No rash, pruritus, skin lesions Neurological: as above Psychiatric: No depression at this time.  No anxiety Endocrine: No palpitations, diaphoresis, change in appetite, change in weigh or increased thirst Hematologic/Lymphatic:  No anemia, purpura, petechiae. Allergic/Immunologic: No itchy/runny eyes, nasal congestion, recent allergic reactions, rashes  ALLERGIES: Allergies  Allergen Reactions   Bupropion  Other (See Comments)    Other reaction(s): Other (See Comments)  seizures  seizures  Other reaction(s): Other (See Comments)  seizures   Nortriptyline  Other (See Comments)    Other reaction(s): Other (See Comments)  seizures  seizures  Other reaction(s): Other (See Comments)  seizures   Ondansetron  Hcl     Other Reaction(s): Seizures   Sertraline Hcl     Other Reaction(s): Seizures    HOME MEDICATIONS:  Current Outpatient Medications:    allopurinol (ZYLOPRIM) 100 MG tablet, Take 100 mg by mouth daily., Disp: , Rfl:    alprazolam  (XANAX ) 2 MG tablet, Take 1-2 mg by mouth 3 (three) times daily as needed., Disp: , Rfl:    atorvastatin (LIPITOR) 40 MG tablet, Take 40 mg at bedtime by mouth., Disp: , Rfl: 3   benzonatate  (TESSALON ) 100 MG capsule, Take 1 capsule (100 mg total) by mouth every 8 (eight) hours. (Patient taking differently: Take 100 mg by mouth as needed.), Disp: 21 capsule, Rfl: 0   estradiol (ESTRACE) 1 MG tablet, Take 1 mg by mouth daily., Disp: , Rfl:    FLUoxetine (PROZAC) 20 MG capsule, Take 20 mg by mouth daily., Disp: ,  Rfl:    FLUoxetine (PROZAC) 40 MG capsule, Take 40 mg by mouth daily., Disp: , Rfl:    furosemide (LASIX) 20 MG tablet, Take 20 mg by mouth daily., Disp: , Rfl:    gabapentin (NEURONTIN) 300 MG capsule, Take 300 mg by mouth daily., Disp: , Rfl:    lamoTRIgine  (LAMICTAL ) 200 MG tablet, Take 1 tablet (200 mg total) by mouth 2 (two) times daily. (Patient taking differently: Take 100 mg by mouth 2 (two) times daily.), Disp: 180 tablet, Rfl: 3   methocarbamol (ROBAXIN) 750 MG tablet, Take 750 mg by mouth every 8 (eight) hours as needed., Disp: , Rfl:    OZEMPIC, 1 MG/DOSE, 4 MG/3ML  SOPN, Inject 1 mg into the skin once a week., Disp: , Rfl:    promethazine  (PHENERGAN ) 25 MG tablet, Take 25 mg by mouth every 8 (eight) hours as needed., Disp: , Rfl:   PAST MEDICAL HISTORY: Past Medical History:  Diagnosis Date   Abdominal pain 09/06/2012   Abnormal mammogram of left breast 07/02/2016   Formatting of this note might be different from the original. Left posterior upper-outer quadrant 1.5cm asymmetry with decreased attenuation and conspicuity. BIRAD 3. 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 Reports biopsy with NAFLD   CKD (chronic kidney disease)    Cold sore 12/16/2011   Depression 08/19/2011   Depression with anxiety    Diabetes mellitus (HCC) 06/01/2011   Diabetic polyneuropathy associated with type 2 diabetes mellitus (HCC) 11/14/2020   Dizziness    Edema of both lower legs due to peripheral venous insufficiency     Eyelid eczema 01/06/2012   Fatigue    loss of sleep   Gastric bypass status for obesity 02/28/2014   Gastroesophageal reflux disease without esophagitis 01/27/2015   Generalized headaches    H/O type 2 diabetes mellitus 08/03/2013   Herpes simplex 12/16/2011   Formatting of this note might be different from the original. Last Assessment & Plan:  New.  Start Valtrex  to improve sxs.   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 S/p lithotripsy and ureteroscopy. 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   Recurrent kidney stones 05/09/2013   Recurrent UTI    Restless leg 11/14/2020   S/P gastric bypass 06/01/2011   Seizure disorder (HCC) 11/14/2020   Slurred speech 05/10/2021   Small bowel intussusception (HCC) 01/17/2014   Stage 3 chronic kidney disease (HCC) 03/17/2021   Superficial postoperative wound infection 08/11/2013   Syncope 05/09/2013   Tension headache  05/09/2013   Thrombophlebitis    Type 2 diabetes mellitus with hyperlipidemia (HCC)    Type 2 diabetes mellitus without complications (HCC) 06/01/2011   Vitamin D  deficiency  Wears glasses    Weight decrease     PAST SURGICAL HISTORY: Past Surgical History:  Procedure Laterality Date   ABDOMINAL HYSTERECTOMY  12/28/2006   for polycystic ovaries   ABDOMINOPLASTY  06/2023   Tummy tuck   APPENDECTOMY     BILATERAL SALPINGOOPHORECTOMY  12/28/2010   Dr Okey   CHOLECYSTECTOMY  12/29/2003   COLONOSCOPY     negative, done for pain   CYSTOSTOMY W/ BLADDER BIOPSY     EXPLORATORY LAPAROTOMY  09/01/2011   GASTRIC BYPASS  10/28/2010   kidney stones     LAPAROSCOPY  10/19/2011   Procedure: LAPAROSCOPY OPERATIVE;  Surgeon: Peggye Okey;  Location: [facility];  Service: Gynecology;  Laterality: N/A;  Operative Laparoscopy with Lysis Of Adhesions   LAPAROTOMY  10/19/2011   Procedure: LAPAROTOMY;  Surgeon: Peggye Okey;  Location: [facility];  Service: Gynecology;  Laterality: Bilateral;  Laparotomy With Bilateral Oophorectomy   TUBAL LIGATION     x3   UPPER GASTROINTESTINAL ENDOSCOPY     to assess varices    FAMILY HISTORY: Family History  Problem Relation Age of Onset   Lung cancer Mother    Cancer Mother        lung   Hyperlipidemia Father    Heart disease Father    Stroke Father    Hypertension Father    Diabetes Father    Crohn's disease Father    Colon cancer Father    Hypertension Brother    Other Brother        suicide    SOCIAL HISTORY:  Social History   Socioeconomic History   Marital status: Married    Spouse name: Not on file   Number of children: 4   Years of education: GED/community college   Highest education level: Not on file  Occupational History   Occupation: [employer]  Tobacco Use   Smoking status: Never   Smokeless tobacco: Never  Vaping Use   Vaping status: Never Used  Substance and Sexual Activity   Alcohol use: Never    Comment: never   Drug use:  No   Sexual activity: Yes    Birth control/protection: Surgical, Implant  Other Topics Concern   Not on file  Social History Narrative   Lives with husband   Caffeine use: Soda-1- 16oz bottle per day   Right handed    Social Drivers of Health   Tobacco Use: Low Risk (01/01/2025)   Patient History    Smoking Tobacco Use: Never    Smokeless Tobacco Use: Never    Passive Exposure: Not on file  Financial Resource Strain: Not on file  Food Insecurity: Not on file  Transportation Needs: Not on file  Physical Activity: Not on file  Stress: Not on file  Social Connections: Unknown (05/05/2022)   Received from [facility]   Social Network    Social Network: Not on file  Intimate Partner Violence: Unknown (03/31/2022)   Received from [facility]   HITS    Physically Hurt: Not on file    Insult or Talk Down To: Not on file    Threaten Physical Harm: Not on file    Scream or Curse: Not on file  Depression (EYV7-0): Not on file  Alcohol Screen: Not on file  Housing: Not on file  Utilities: Not on file  Health Literacy: Not on file     PHYSICAL EXAM  Vitals:   01/01/25 1407  BP: 108/75  Pulse: 74  Resp: 16  SpO2: 97%  Weight: 198 lb 8 oz (90 kg)  Height: 5' 4 (1.626 m)     Body mass index is 34.07 kg/m.   General: The patient is well-developed and well-nourished and in no acute distress  HEENT:  Head is [place]/AT.  Sclera are anicteric.     Neck:   The neck is nontender.  Skin: Extremities are without rash or  edema.  Musculoskeletal:  Back is nontender  Neurologic Exam  Mental status: The patient is alert and oriented x 3 at the time of the examination.  Short-term memory was 2/3.  She had reduced focus/attention.[name]   Speech is normal.  Cranial nerves: Extraocular movements are full. Pupils are equal, round, and reactive to light and accomodation.  There is good facial sensation to soft touch bilaterally.Facial strength is normal.  Trapezius and  sternocleidomastoid strength is normal. No dysarthria is noted.   No obvious hearing deficits are noted.  Motor:  Muscle bulk is normal.   Tone is normal. Strength is  5 / 5 in all 4 extremities.   Sensory: Sensory testing is intact to pinprick, soft touch and vibration sensation in arms.  She has reduced vibration and pinprick sensation in toes but normal at ankles.     Coordination: Cerebellar testing reveals good finger-nose-finger and heel-to-shin bilaterally.  Gait and station: Station is normal.   Gait is normal. Tandem gait is mildly wide.  She has retropulsion.. Romberg is negative.   Reflexes: Deep tendon reflexes are symmetric and normal bilaterally.   Plantar responses are flexor.    DIAGNOSTIC DATA (LABS, IMAGING, TESTING) - I reviewed patient records, labs, notes, testing and imaging myself where available.  Lab Results  Component Value Date   WBC 5.0 11/29/2024   HGB 12.7 11/29/2024   HCT 38.2 11/29/2024   MCV 84.3 11/29/2024   PLT 156 11/29/2024      Component Value Date/Time   NA 141 11/29/2024 1113   NA 138 02/28/2021 0916   K 4.2 11/29/2024 1113   CL 100 11/29/2024 1113   CO2 27 11/29/2024 1113   GLUCOSE 115 (H) 11/29/2024 1113   BUN 19 11/29/2024 1113   BUN 13 02/28/2021 0916   CREATININE 1.57 (H) 11/29/2024 1113   CREATININE 1.50 (H) 01/06/2012 1612   CALCIUM  9.7 11/29/2024 1113   PROT 6.6 05/08/2024 0920   PROT 6.8 11/14/2020 1549   ALBUMIN 4.1 05/08/2024 0920   ALBUMIN 4.5 11/14/2020 1549   AST 34 05/08/2024 0920   ALT 27 05/08/2024 0920   ALKPHOS 83 05/08/2024 0920   BILITOT 0.4 05/08/2024 0920   BILITOT 0.6 11/14/2020 1549   GFRNONAA 39 (L) 11/29/2024 1113   GFRAA 60 02/18/2021 1039   Lab Results  Component Value Date   CHOL 204 (H) 09/22/2012   HDL 57.30 09/22/2012   LDLCALC 68 12/16/2011   LDLDIRECT 135.0 09/22/2012   TRIG 80.0 09/22/2012   CHOLHDL 4 09/22/2012   Lab Results  Component Value Date   HGBA1C 5.3 11/14/2017   Lab  Results  Component Value Date   VITAMINB12 383 08/29/2024   Lab Results  Component Value Date   TSH 1.760 08/29/2024       ASSESSMENT AND PLAN  Seizure disorder (HCC)  OSA on CPAP - Plan: For home use only DME continuous positive airway pressure (CPAP)  Nocturnal hypoxemia  Gait disturbance  Cognitive change  Diabetic polyneuropathy associated with type 2 diabetes mellitus (HCC)   She will continue lamotrigine  and gabapentin for seizures and dysesthesia  She will try to continue CPAP.  She is having difficulty with the nasal bar mask and I recommended that she try a different mask.  We will send an order to add to [facility] Stay active and exercise as tolerated. Return in 6 to 7 months or sooner if there are new or worsening neurologic symptoms.    Everest Hacking A. Vear, MD, [facility] 01/01/2025, 8:03 PM Certified in Neurology, Clinical Neurophysiology, Sleep Medicine and Neuroimaging  [facility] [address] [place], [place] [address] [phone] "

## 2025-04-04 ENCOUNTER — Ambulatory Visit: Payer: MEDICAID | Admitting: Neurology

## 2026-01-03 ENCOUNTER — Ambulatory Visit: Payer: MEDICAID | Admitting: Neurology
